# Patient Record
Sex: Male | Born: 1990 | Race: White | Hispanic: No | Marital: Single | Smoking: Current some day smoker
Health system: Southern US, Community
[De-identification: ages and names within clinical notes are randomized; demographics above are authoritative.]

## PROBLEM LIST (undated history)

## (undated) DIAGNOSIS — F419 Anxiety disorder, unspecified: Secondary | ICD-10-CM

## (undated) DIAGNOSIS — F410 Panic disorder [episodic paroxysmal anxiety] without agoraphobia: Secondary | ICD-10-CM

## (undated) DIAGNOSIS — E785 Hyperlipidemia, unspecified: Secondary | ICD-10-CM

## (undated) DIAGNOSIS — F1010 Alcohol abuse, uncomplicated: Secondary | ICD-10-CM

## (undated) DIAGNOSIS — F1910 Other psychoactive substance abuse, uncomplicated: Secondary | ICD-10-CM

## (undated) DIAGNOSIS — F329 Major depressive disorder, single episode, unspecified: Secondary | ICD-10-CM

## (undated) DIAGNOSIS — F32A Depression, unspecified: Secondary | ICD-10-CM

---

## 2014-04-26 ENCOUNTER — Encounter (HOSPITAL_COMMUNITY): Payer: Self-pay | Admitting: Emergency Medicine

## 2014-04-26 ENCOUNTER — Emergency Department (HOSPITAL_COMMUNITY)
Admission: EM | Admit: 2014-04-26 | Discharge: 2014-04-27 | Disposition: A | Discharge status: Home / Self Care | Payer: Federal, State, Local not specified - PPO | Attending: Emergency Medicine | Admitting: Emergency Medicine

## 2014-04-26 DIAGNOSIS — Z720 Tobacco use: Secondary | ICD-10-CM | POA: Diagnosis not present

## 2014-04-26 DIAGNOSIS — IMO0002 Reserved for concepts with insufficient information to code with codable children: Secondary | ICD-10-CM

## 2014-04-26 DIAGNOSIS — R000 Tachycardia, unspecified: Secondary | ICD-10-CM | POA: Diagnosis not present

## 2014-04-26 DIAGNOSIS — T408X1A Poisoning by lysergide [LSD], accidental (unintentional), initial encounter: Secondary | ICD-10-CM | POA: Insufficient documentation

## 2014-04-26 DIAGNOSIS — Y929 Unspecified place or not applicable: Secondary | ICD-10-CM | POA: Diagnosis not present

## 2014-04-26 DIAGNOSIS — F410 Panic disorder [episodic paroxysmal anxiety] without agoraphobia: Secondary | ICD-10-CM | POA: Insufficient documentation

## 2014-04-26 DIAGNOSIS — E669 Obesity, unspecified: Secondary | ICD-10-CM | POA: Diagnosis not present

## 2014-04-26 DIAGNOSIS — Y998 Other external cause status: Secondary | ICD-10-CM | POA: Diagnosis not present

## 2014-04-26 DIAGNOSIS — Y939 Activity, unspecified: Secondary | ICD-10-CM | POA: Diagnosis not present

## 2014-04-26 HISTORY — DX: Panic disorder (episodic paroxysmal anxiety): F41.0

## 2014-04-26 MED ORDER — ALPRAZOLAM 0.5 MG PO TABS
1.0000 mg | ORAL_TABLET | Freq: Once | ORAL | Status: AC
  Administered 2014-04-27: 1 mg via ORAL
  Filled 2014-04-26: qty 2

## 2014-04-26 NOTE — ED Provider Notes (Signed)
CSN: [id number]     Arrival date & time 04/26/14  2251 History   First MD Initiated Contact with Patient 04/26/14 2335     Chief Complaint  Patient presents with  . Panic Attack     (Consider location/radiation/quality/duration/timing/severity/associated sxs/prior Treatment) The history is provided by the patient.   Camdin Hegner is a 24 y.o. male who presents to the ED complaining of having a panic attack that started approximately 1 hour prior to arrival to the ED. He states that the feeling started after doing what he thought was LSD but is not really sure what it was. He does have a history of panic attacks and has been on Xanax in the past. He moved here one year ago and has not had any medication or panic attacks until now.    Past Medical History  Diagnosis Date  . Panic attacks    History reviewed. No pertinent past surgical history. No family history on file. History  Substance Use Topics  . Smoking status: Current Every Day Smoker  . Smokeless tobacco: Not on file  . Alcohol Use: Yes    Review of Systems  Respiratory: Positive for chest tightness.   Cardiovascular: Negative for chest pain.  Psychiatric/Behavioral: The patient is nervous/anxious.   all other systems negative    Allergies  Review of patient's allergies indicates no known allergies.  Home Medications   Prior to Admission medications   Medication Sig Start Date End Date Taking? Authorizing Provider  ALPRAZolam Prudy Feeler) 0.5 MG tablet Take 1 tablet (0.5 mg total) by mouth 2 (two) times daily as needed for anxiety. 04/27/14   Keylan Costabile Orlene Och, NP   BP 139/79 mmHg  Pulse 97  Temp(Src) 98.1 F (36.7 C) (Oral)  Resp 20  Ht 6' (1.829 m)  Wt 280 lb (127.007 kg)  BMI 37.97 kg/m2  SpO2 100% Physical Exam  Constitutional: He is oriented to person, place, and time. No distress.  obese  HENT:  Head: Normocephalic.  Eyes: Conjunctivae and EOM are normal.  Neck: Normal range of motion. Neck supple.   Cardiovascular: Tachycardia present.   Pulmonary/Chest: Effort normal. He has no wheezes. He has no rales.  Hyperventilating   Abdominal: Soft. There is no tenderness.  Musculoskeletal: Normal range of motion.  Neurological: He is alert and oriented to person, place, and time. No cranial nerve deficit.  Skin: Skin is warm and dry.  Psychiatric: His speech is normal. Thought content normal. His mood appears anxious.  Nursing note and vitals reviewed.   ED Course  Procedures  Patient given xanax 1 mg. PO @ 0230 patient feeling much better vital signs normal. Patient no longer feeling anxious.   Results for orders placed or performed during the hospital encounter of 04/26/14 (from the past 24 hour(s))  Drug screen panel, emergency     Status: None   Collection Time: 04/27/14 12:13 AM  Result Value Ref Range   Opiates NONE DETECTED NONE DETECTED   Cocaine NONE DETECTED NONE DETECTED   Benzodiazepines NONE DETECTED NONE DETECTED   Amphetamines NONE DETECTED NONE DETECTED   Tetrahydrocannabinol NONE DETECTED NONE DETECTED   Barbiturates NONE DETECTED NONE DETECTED    BP 139/79 mmHg  Pulse 97  Temp(Src) 98.1 F (36.7 C) (Oral)  Resp 20  Ht 6' (1.829 m)  Wt 280 lb (127.007 kg)  BMI 37.97 kg/m2  SpO2 100%  MDM  24 y.o. male with panic attack after taking what he thought was LSD. Stable for d/c with  normal vital signs and no longer symptomatic. Discussed with the patient plan of care and all questioned fully answered. He will keep his appointment with his new PCP and return here if any problems arise.   Final diagnoses:  Anxiety attack  LSD reaction       Janne NapoleonHope M Raahil Ong, NP 04/27/14 0245  Ward GivensIva L Knapp, MD 04/27/14 [phone]

## 2014-04-26 NOTE — ED Notes (Signed)
Patient states he did LSD tonight and he thinks this is what precipitated his panic attack.

## 2014-04-26 NOTE — ED Notes (Signed)
Patient states he is having a panic attack x 1 hour.

## 2014-04-27 LAB — RAPID URINE DRUG SCREEN, HOSP PERFORMED
Amphetamines: NOT DETECTED
BARBITURATES: NOT DETECTED
Benzodiazepines: NOT DETECTED
Cocaine: NOT DETECTED
Opiates: NOT DETECTED
TETRAHYDROCANNABINOL: NOT DETECTED

## 2014-04-27 MED ORDER — ALPRAZOLAM 0.5 MG PO TABS: 0.5000 mg | ORAL_TABLET | Freq: Two times a day (BID) | ORAL | Status: DC | PRN

## 2014-04-27 MED ORDER — ALPRAZOLAM 0.5 MG PO TABS
ORAL_TABLET | ORAL | Status: AC
  Filled 2014-04-27: qty 1

## 2014-04-27 NOTE — Discharge Instructions (Signed)

## 2014-05-02 ENCOUNTER — Emergency Department (HOSPITAL_COMMUNITY)
Admission: EM | Admit: 2014-05-02 | Discharge: 2014-05-02 | Disposition: A | Discharge status: Home / Self Care | Payer: Federal, State, Local not specified - PPO | Attending: Emergency Medicine | Admitting: Emergency Medicine

## 2014-05-02 ENCOUNTER — Encounter (HOSPITAL_COMMUNITY): Payer: Self-pay | Admitting: Emergency Medicine

## 2014-05-02 DIAGNOSIS — F410 Panic disorder [episodic paroxysmal anxiety] without agoraphobia: Secondary | ICD-10-CM | POA: Diagnosis not present

## 2014-05-02 DIAGNOSIS — L03311 Cellulitis of abdominal wall: Secondary | ICD-10-CM | POA: Insufficient documentation

## 2014-05-02 DIAGNOSIS — M1990 Unspecified osteoarthritis, unspecified site: Secondary | ICD-10-CM | POA: Insufficient documentation

## 2014-05-02 DIAGNOSIS — Z720 Tobacco use: Secondary | ICD-10-CM | POA: Insufficient documentation

## 2014-05-02 DIAGNOSIS — L02211 Cutaneous abscess of abdominal wall: Secondary | ICD-10-CM | POA: Insufficient documentation

## 2014-05-02 DIAGNOSIS — L0390 Cellulitis, unspecified: Secondary | ICD-10-CM

## 2014-05-02 DIAGNOSIS — Z79899 Other long term (current) drug therapy: Secondary | ICD-10-CM | POA: Insufficient documentation

## 2014-05-02 DIAGNOSIS — L0291 Cutaneous abscess, unspecified: Secondary | ICD-10-CM

## 2014-05-02 DIAGNOSIS — M791 Myalgia: Secondary | ICD-10-CM | POA: Insufficient documentation

## 2014-05-02 LAB — CBG MONITORING, ED: GLUCOSE-CAPILLARY: 74 mg/dL (ref 70–99)

## 2014-05-02 MED ORDER — HYDROCODONE-ACETAMINOPHEN 5-325 MG PO TABS: 1.0000 | ORAL_TABLET | ORAL | Status: DC | PRN

## 2014-05-02 MED ORDER — DOXYCYCLINE HYCLATE 100 MG PO TABS
100.0000 mg | ORAL_TABLET | Freq: Once | ORAL | Status: AC
  Administered 2014-05-02: 100 mg via ORAL
  Filled 2014-05-02: qty 1

## 2014-05-02 MED ORDER — DOXYCYCLINE HYCLATE 100 MG PO TABS: 100.0000 mg | ORAL_TABLET | Freq: Two times a day (BID) | ORAL | Status: DC

## 2014-05-02 MED ORDER — HYDROCODONE-ACETAMINOPHEN 5-325 MG PO TABS
1.0000 | ORAL_TABLET | Freq: Once | ORAL | Status: AC
  Administered 2014-05-02: 1 via ORAL
  Filled 2014-05-02: qty 1

## 2014-05-02 NOTE — ED Provider Notes (Signed)
CSN: [id number]     Arrival date & time 05/02/14  1831 History   First MD Initiated Contact with Patient 05/02/14 2004     Chief Complaint  Patient presents with  . Abscess     (Consider location/radiation/quality/duration/timing/severity/associated sxs/prior Treatment) The history is provided by the patient and a parent.   Jeffery Hancock is a 24 y.o. male presenting for evaluation of an abscess on his right abdomen.  He first noticed a large pimple about 5 days ago which has become more tender and popped today at work draining a large amount of pus and blood.  It is tender, but improved since it drained.  He denies fevers, chills, nausea or myalgias. He is not diabetic but has strong family history of DM.     Past Medical History  Diagnosis Date  . Panic attacks    History reviewed. No pertinent past surgical history. Family History  Problem Relation Age of Onset  . Diabetes Other   . Hypertension Other   . Cancer Other   . Heart failure Other    History  Substance Use Topics  . Smoking status: Current Every Day Smoker -- 0.50 packs/day    Types: Cigarettes  . Smokeless tobacco: Not on file  . Alcohol Use: 18.0 oz/week    30 Cans of beer per week    Review of Systems  Constitutional: Negative for fever and chills.  Respiratory: Negative for shortness of breath and wheezing.   Gastrointestinal: Negative for nausea.  Musculoskeletal: Positive for myalgias and arthralgias.  Skin: Positive for color change.  Neurological: Negative for weakness and numbness.      Allergies  Review of patient's allergies indicates no known allergies.  Home Medications   Prior to Admission medications   Medication Sig Start Date End Date Taking? Authorizing Provider  ALPRAZolam Prudy Feeler) 0.5 MG tablet Take 1 tablet (0.5 mg total) by mouth 2 (two) times daily as needed for anxiety. 04/27/14   Hope Orlene Och, NP  doxycycline (VIBRA-TABS) 100 MG tablet Take 1 tablet (100 mg total) by mouth  2 (two) times daily. 05/02/14   Burgess Amor, PA-C  HYDROcodone-acetaminophen (NORCO/VICODIN) 5-325 MG per tablet Take 1 tablet by mouth every 4 (four) hours as needed for moderate pain. 05/02/14   Burgess Amor, PA-C   BP 147/89 mmHg  Pulse 100  Temp(Src) 98.9 F (37.2 C) (Oral)  Resp 20  Ht 6' (1.829 m)  Wt 280 lb (127.007 kg)  BMI 37.97 kg/m2  SpO2 100% Physical Exam  Constitutional: He appears well-developed and well-nourished.  HENT:  Head: Normocephalic and atraumatic.  Eyes: Conjunctivae are normal.  Neck: Normal range of motion.  Cardiovascular: Normal rate, regular rhythm, normal heart sounds and intact distal pulses.   Pulmonary/Chest: Effort normal and breath sounds normal. He has no wheezes.  Abdominal: Soft. Bowel sounds are normal. There is no tenderness.  Musculoskeletal: Normal range of motion.  Neurological: He is alert.  Skin: Skin is dry.  Draining abscess right mid abdomen, approx 2 cm area of induration with scant purulent drainage with palpation.  7 cm x 14 cm surrounding erythema, no red streaking.  Psychiatric: He has a normal mood and affect.  Nursing note and vitals reviewed.   ED Course  Procedures (including critical care time)   Abscess site flushed with no further purulent drainage obtained   Labs Review Labs Reviewed  CBG MONITORING, ED    Imaging Review No results found.   EKG Interpretation None  MDM   Final diagnoses:  Abscess and cellulitis    Cellulitis edges marked. Pt advised close f/u here if cellulitis continues to enlarge.  Doxycycline, first dose given here.  Hydrocodone, warm compresses.   The patient appears reasonably screened and/or stabilized for discharge and I doubt any other medical condition or other [facility] requiring further screening, evaluation, or treatment in the ED at this time prior to discharge.    Burgess AmorJulie Sheriece Jefcoat, PA-C 05/03/14 1139  Vanetta MuldersScott Zackowski, MD 05/04/14 [phone]

## 2014-05-02 NOTE — ED Notes (Signed)
Pt reports abscess to R side of abdomen.

## 2014-05-02 NOTE — ED Notes (Signed)
Pt verbalized understanding of no driving and to use caution within 4 hours of taking pain meds due to meds cause drowsiness 

## 2014-05-02 NOTE — Discharge Instructions (Signed)
Cellulitis Cellulitis is an infection of the skin and the tissue beneath it. The infected area is usually red and tender. Cellulitis occurs most often in the arms and lower legs.  CAUSES  Cellulitis is caused by bacteria that enter the skin through cracks or cuts in the skin. The most common types of bacteria that cause cellulitis are staphylococci and streptococci. SIGNS AND SYMPTOMS   Redness and warmth.  Swelling.  Tenderness or pain.  Fever. DIAGNOSIS  Your health care provider can usually determine what is wrong based on a physical exam. Blood tests may also be done. TREATMENT  Treatment usually involves taking an antibiotic medicine. HOME CARE INSTRUCTIONS   Take your antibiotic medicine as directed by your health care provider. Finish the antibiotic even if you start to feel better.  Keep the infected arm or leg elevated to reduce swelling.  Apply a warm cloth to the affected area up to 4 times per day to relieve pain.  Take medicines only as directed by your health care provider.  Keep all follow-up visits as directed by your health care provider. SEEK MEDICAL CARE IF:   You notice red streaks coming from the infected area.  Your red area gets larger or turns dark in color.  Your bone or joint underneath the infected area becomes painful after the skin has healed.  Your infection returns in the same area or another area.  You notice a swollen bump in the infected area.  You develop new symptoms.  You have a fever. SEEK IMMEDIATE MEDICAL CARE IF:   You feel very sleepy.  You develop vomiting or diarrhea.  You have a general ill feeling (malaise) with muscle aches and pains. MAKE SURE YOU:   Understand these instructions.  Will watch your condition.  Will get help right away if you are not doing well or get worse. Document Released: 11/29/2004 Document Revised: 07/06/2013 Document Reviewed: 05/07/2011 [employer] Patient Information 2015 [place].  This information is not intended to replace advice given to you by your health care provider. Make sure you discuss any questions you have with your health care provider.  Abscess Care After An abscess (also called a boil or furuncle) is an infected area that contains a collection of pus. Signs and symptoms of an abscess include pain, tenderness, redness, or hardness, or you may feel a moveable soft area under your skin. An abscess can occur anywhere in the body. The infection may spread to surrounding tissues causing cellulitis. A cut (incision) by the surgeon was made over your abscess and the pus was drained out. Gauze may have been packed into the space to provide a drain that will allow the cavity to heal from the inside outwards. The boil may be painful for 5 to 7 days. Most people with a boil do not have high fevers. Your abscess, if seen early, may not have localized, and may not have been lanced. If not, another appointment may be required for this if it does not get better on its own or with medications.  Your abscess is draining spontaneously so did not need to be lanced today. HOME CARE INSTRUCTIONS   Only take over-the-counter or prescription medicines for pain, discomfort, or fever as directed by your caregiver.  When you bathe, soak and then remove gauze or iodoform packs at least daily or as directed by your caregiver. You may then wash the wound gently with mild soapy water. Repack with gauze or do as your caregiver directs. SEEK  IMMEDIATE MEDICAL CARE IF:   You develop increased pain, swelling, redness, drainage, or bleeding in the wound site.  You develop signs of generalized infection including muscle aches, chills, fever, or a general ill feeling.  An oral temperature above 102 F (38.9 C) develops, not controlled by medication. See your caregiver for a recheck if you develop any of the symptoms described above. If medications (antibiotics) were prescribed, take them as  directed. Document Released: 09/07/2004 Document Revised: 05/14/2011 Document Reviewed: 05/05/2007 [facility] Patient Information 2015 [place], [place]. This information is not intended to replace advice given to you by your health care provider. Make sure you discuss any questions you have with your health care provider.

## 2014-06-20 ENCOUNTER — Emergency Department (HOSPITAL_COMMUNITY)
Admission: EM | Admit: 2014-06-20 | Discharge: 2014-06-20 | Disposition: A | Discharge status: Home / Self Care | Payer: Federal, State, Local not specified - PPO | Attending: Emergency Medicine | Admitting: Emergency Medicine

## 2014-06-20 ENCOUNTER — Encounter (HOSPITAL_COMMUNITY): Payer: Self-pay | Admitting: Emergency Medicine

## 2014-06-20 DIAGNOSIS — F1910 Other psychoactive substance abuse, uncomplicated: Secondary | ICD-10-CM | POA: Diagnosis not present

## 2014-06-20 DIAGNOSIS — Z792 Long term (current) use of antibiotics: Secondary | ICD-10-CM | POA: Insufficient documentation

## 2014-06-20 DIAGNOSIS — Z720 Tobacco use: Secondary | ICD-10-CM | POA: Diagnosis not present

## 2014-06-20 DIAGNOSIS — Z8639 Personal history of other endocrine, nutritional and metabolic disease: Secondary | ICD-10-CM | POA: Diagnosis not present

## 2014-06-20 DIAGNOSIS — F1010 Alcohol abuse, uncomplicated: Secondary | ICD-10-CM | POA: Insufficient documentation

## 2014-06-20 DIAGNOSIS — F410 Panic disorder [episodic paroxysmal anxiety] without agoraphobia: Secondary | ICD-10-CM | POA: Diagnosis not present

## 2014-06-20 DIAGNOSIS — Z79899 Other long term (current) drug therapy: Secondary | ICD-10-CM | POA: Diagnosis not present

## 2014-06-20 HISTORY — DX: Anxiety disorder, unspecified: F41.9

## 2014-06-20 HISTORY — DX: Other psychoactive substance abuse, uncomplicated: F19.10

## 2014-06-20 HISTORY — DX: Alcohol abuse, uncomplicated: F10.10

## 2014-06-20 HISTORY — DX: Hyperlipidemia, unspecified: E78.5

## 2014-06-20 NOTE — ED Notes (Signed)
Provided with list of resources for treatment centers.

## 2014-06-20 NOTE — Discharge Instructions (Signed)
°Emergency Department Resource Guide °1) Find a Doctor and Pay Out of Pocket °Although you won't have to find out who is covered by your insurance plan, it is a good idea to ask around and get recommendations. You will then need to call the office and see if the doctor you have chosen will accept you as a new patient and what types of options they offer for patients who are self-pay. Some doctors offer discounts or will set up payment plans for their patients who do not have insurance, but you will need to ask so you aren't surprised when you get to your appointment. ° °2) Contact Your Local Health Department °Not all health departments have doctors that can see patients for sick visits, but many do, so it is worth a call to see if yours does. If you don't know where your local health department is, you can check in your phone book. The CDC also has a tool to help you locate your state's health department, and many state websites also have listings of all of their local health departments. ° °3) Find a Walk-in Clinic °If your illness is not likely to be very severe or complicated, you may want to try a walk in clinic. These are popping up all over the country in pharmacies, drugstores, and shopping centers. They're usually staffed by nurse practitioners or physician assistants that have been trained to treat common illnesses and complaints. They're usually fairly quick and inexpensive. However, if you have serious medical issues or chronic medical problems, these are probably not your best option. ° °No Primary Care Doctor: °- Call [facility] at  832-8000 - they can help you locate a primary care doctor that  accepts your insurance, provides certain services, etc. °- Physician Referral Service- 1-800-533-3463 ° °Chronic Pain Problems: °Organization         Address  Phone   Notes  °[facility]  (336) 297-2271 Patients need to be referred by their primary care doctor.  ° °Medication  Assistance: °Organization         Address  Phone   Notes  °[place] [facility] [address] °[place], [place] [address] (336) 641-8030 --Must be a resident of [place] °-- Must have NO insurance coverage whatsoever (no Medicaid/ Medicare, etc.) °-- The pt. MUST have a primary care doctor that directs their care regularly and follows them in the community °  °[facility]  (866) 331-1348   °[employer]  (888) 892-1162   ° °Agencies that provide inexpensive medical care: °Organization         Address  Phone   Notes  °[facility]  (336) 832-8035   °[facility]    (336) 832-7272   °[facility] [address] °[place], [place] [address] (336) 832-4777   °[facility] [address], °[place] (336) 271-4999   °[facility]    (336) 373-0678   °[facility]    (336) 272-1050   °[facility] ° [address], [place] Phone:  (336) 832-4444, Fax:  (336) 832-4440 Hours of Operation:  9 am - 6 pm, M-F.  Also accepts Medicaid/Medicare and self-pay.  °[facility] ° [address], [place] Phone: (336) 832-3150, Fax: (336) [phone]. Hours of Operation:  8:30 am - 5:30 pm, M-F.  Also accepts Medicaid and self-pay.  °[facility] [place] [address], [place] Phone: (336) 878-6027   °[facility] [address], [place], [place] (336)723-1848, Ext. 123 Mondays & Thursdays: 7-9 AM.  First 15 patients are seen on a first come, first serve basis. °  ° °Medicaid-accepting [place] Providers: ° °Organization         Address  Phone   Notes  °[facility] [address], [place] (336) 641-2100 Also accepts self-pay patients.  °[facility] [address], [place] ° (336) 856-9996   °[facility] [address], [place]  (336) 288-8857   °[facility] [address] [place] [address], [place] (336) 299-7000   °Veita Bland [address], [place]  ° (336) 373-1557 Only accepts [place] Access Medicaid patients after they have their name applied to their card.  ° °Self-Pay (no insurance) in [place]: ° °Organization         Address  Phone   Notes  °Sickle Cell Patients, [facility] [address], [place] (336) 832-1970   °[facility] [address], [place] (336) 832-4400   °[facility] ° [address], [place] (336) 992-4800   °[facility]/Dr. Osei-Bonsu ° [address] [place] [address], [place] or [address], [place] (336) 841-8500 Phone number for both [place] and [place] locations is the same.  °[facility] [address], [place] (336) 299-0000   °[facility] [address] [place] [address], [place] or [address] (336) 852-7530 °(336) 878-2260   °[facility] [address], [place] (336) 350-1642, phone; (336) 294-5005, fax Sees patients 1st and 3rd Saturday of every month.  Must not qualify for public or private insurance (i.e. Medicaid, Medicare, [place] Health Choice, Veterans' Benefits) • Household income should be no more than 200% of the poverty level •The clinic cannot treat you if you are pregnant or think you are pregnant • Sexually transmitted diseases are not treated at the clinic.  ° ° °Dental Care: °Organization         Address  Phone  Notes  °[place] [facility] [address], [place] (336) 641-6152 Accepts children up to age 21 who are enrolled in Medicaid or [place] Health Choice; pregnant women with a Medicaid card; and children who have applied for Medicaid or [place] Health Choice, but were declined, whose parents can pay a reduced fee at time of service.  °[place]  [facility] [place]  [address], [place] (336) 641-7733 Accepts children up to age 21 who are enrolled in Medicaid or [place] Health Choice; pregnant women with a Medicaid card; and children who have applied for Medicaid or [place] Health Choice, but were declined, whose parents can pay a reduced fee at time of service.  °[facility] ° [address], [place] (336) 641-4533 Patients are seen by appointment only. Walk-ins are not accepted. [facility] will see patients 18 years of age and older. °Monday - Tuesday (8am-5pm) °Most Wednesdays (8:30-5pm) °$30 per visit, cash only  °[facility] ° [address], [place] (336) 641-4533 Patients are seen by appointment only. Walk-ins are not accepted. [facility] will see patients 18 years of age and older. °One   Wednesday Evening (Monthly: Volunteer Based).  $30 per visit, cash only  °[facility]  (919) 537-3737 for adults; Children under age 4, call Graduate Pediatric Dentistry at ([phone]) [phone]. Children aged 4-14, please call ([phone]) [phone] to request a pediatric application. ° Dental services are provided in all areas of dental care including fillings, crowns and bridges, complete and partial dentures, implants, gum treatment, root canals, and extractions. Preventive care is also provided. Treatment is provided to both adults and children. °Patients are selected via a lottery and there is often a waiting list. °  °[facility] [address], °[place] ° (336) 763-8833 [email or web address] °  °[facility] [address], [place], [place] (336)723-1848, Ext. 123 Second and Fourth Thursday of each month, opens at 6:30 AM; Clinic ends at 9 AM.  Patients are seen on a first-come first-served basis, and a limited number are seen during each clinic.  ° °[facility] ° [address], [place], [place] (336) 723-7904    Eligibility Requirements °You must have lived in [place], [place], or [place] counties for at least the last three months. °  You cannot be eligible for state or federal sponsored healthcare insurance, including Veterans Administration, Medicaid, or Medicare. °  You generally cannot be eligible for healthcare insurance through your employer.  °  How to apply: °Eligibility screenings are held every Tuesday and Wednesday afternoon from 1:00 pm until 4:00 pm. You do not need an appointment for the interview!  °[facility] [address], [place], [place] 336-631-2330   °[facility]  336-342-8273   °[facility]  336-703-3100   °[facility]  336-570-6415   ° °Behavioral Health Resources in the Community: °Intensive Outpatient Programs °Organization         Address  Phone  Notes  °[facility] [address], [place], [place] 336-878-6098   °[facility] [address], [place], [name] 336-832-9800   °[facility] [address], [place], [place] ° 336-882-2125   °[facility] [address],  °[place], [place] 1-800-853-5163 or 336-641-4981   °Substance Abuse Resources °Organization         Address  Phone  Notes  °[facility]  336-882-2125   °[facility]  336-784-9470   °[facility]  336-285-9073   °[facility]  336-845-3988   °Residential & Outpatient Substance Abuse Program  1-800-659-3381   °Psychological Services °Organization         Address  Phone  Notes  °[facility]  336- 832-9600   °[facility]  336- 378-7881   °[facility] [address], [place] 1-800-853-5163 or 336-641-4981   ° °Mobile Crisis Teams °Organization         Address  Phone  Notes  °[facility], Mobile Crisis Care Unit  1-877-626-1772   °Assertive °[facility] ° [address]  [place], [place] 336-834-9664   °Sharon DeEsch [address] °[place] 336-554-5454   ° °Self-Help/Support Groups °Organization         Address  Phone             Notes  °[facility]  - variety of support groups  336- 373-1402 Call for more information  °Narcotics Anonymous (NA), [facility] [address], °[place]  2 meetings at this location  ° °  Residential Treatment Programs Organization         Address  Phone  Notes  [facility] [address],    [place]  [phone])[phone]   [facility]  [address], [place] [phone], [place], [place] [phone]   [facility] [address], [place] [phone] Admissions: 8am-3pm M-F  [facility] [address]    [place], [place] [phone]   [facility] [address], [place], [place] [phone]   [facility] [address]  [place], [place] [phone]   [facility] Intensive Outpatient [address], [place], [place] [phone]) [phone]   [facility] [address]  [place], [place] [phone] or [phone]   [facility] ([facility]) [address] [place], [place] [phone] Accepts Medicaid  [facility] [address]  [place] [phone] Substance Abuse/Addiction Treatment   [facility]         Address  Phone  Notes  [facility]  640-746-4162(888) [phone]   Angie FavaJulie Brannon, PhD [address], [address] [place], [place]   [phone](336) [phone] or (780)[phone](336) [phone]   [facility]   [address] [place], [place] [phone](336) [phone]   [facility] [address], [place], [place] [phone](336) [phone] Insurance/Medicaid/sponsorship through [facility] [address]                                    [place], [place] [phone](336) [phone] Therapy/tele-psych/case    [facility] [address]   [place], [place] [phone](336) [phone]    Dr. Lolly MustacheArfeen  [phone]) [phone]   [facility] [place]  [employer] [facility] 1) [address], [place] 2) [address], [place] 3)  [address], [place] [phone](336) [phone](336) [phone]  [phone]) [phone]   [facility] Child Abuse Hotline 813-617-0296(336) [phone] or [phone](336) [phone] (After Hours)      Call the substance abuse resources given to you today to assist you with your detox.  Return to the Emergency Department immediately sooner if worsening.

## 2014-06-20 NOTE — ED Provider Notes (Signed)
CSN: [id number]     Arrival date & time 06/20/14  0849 History   First MD Initiated Contact with Patient 06/20/14 [phone]     Chief Complaint  Patient presents with  . Alcohol Problem  . Addiction Problem      HPI Pt was seen at 0925. Per pt and his family, c/o gradual onset and persistence of constant polysubstance abuse for the past 1 to 2 years. Substances include: etoh, crack, oxycodone, heroin. Pt requesting detox. Pt has not attended a detox program previously. Denies withdrawal symptoms at this time. Denies SI/SA, no HI, no hallucinations.    Past Medical History  Diagnosis Date  . Panic attacks   . Anxiety   . Hyperlipemia   . Alcohol abuse   . Polysubstance abuse     crack, oxycodone, heroin, etoh   History reviewed. No pertinent past surgical history.   Family History  Problem Relation Age of Onset  . Diabetes Other   . Hypertension Other   . Cancer Other   . Heart failure Other    History  Substance Use Topics  . Smoking status: Current Every Day Smoker -- 0.50 packs/day    Types: Cigarettes  . Smokeless tobacco: Not on file  . Alcohol Use: 18.0 oz/week    30 Cans of beer per week    Review of Systems ROS: Statement: All systems negative except as marked or noted in the HPI; Constitutional: Negative for fever and chills. ; ; Eyes: Negative for eye pain, redness and discharge. ; ; ENMT: Negative for ear pain, hoarseness, nasal congestion, sinus pressure and sore throat. ; ; Cardiovascular: Negative for chest pain, palpitations, diaphoresis, dyspnea and peripheral edema. ; ; Respiratory: Negative for cough, wheezing and stridor. ; ; Gastrointestinal: Negative for nausea, vomiting, diarrhea, abdominal pain, blood in stool, hematemesis, jaundice and rectal bleeding. . ; ; Genitourinary: Negative for dysuria, flank pain and hematuria. ; ; Musculoskeletal: Negative for back pain and neck pain. Negative for swelling and trauma.; ; Skin: Negative for pruritus, rash,  abrasions, blisters, bruising and skin lesion.; ; Neuro: Negative for headache, lightheadedness and neck stiffness. Negative for weakness, altered level of consciousness , altered mental status, extremity weakness, paresthesias, involuntary movement, seizure and syncope. ; Psych:  No SI, no SA, no HI, no hallucinations.   Allergies  Review of patient's allergies indicates no known allergies.  Home Medications   Prior to Admission medications   Medication Sig Start Date End Date Taking? Authorizing Provider  DULoxetine (CYMBALTA) 30 MG capsule Take 30 mg by mouth daily.   Yes Historical Provider, MD  ALPRAZolam Prudy Feeler(XANAX) 0.5 MG tablet Take 1 tablet (0.5 mg total) by mouth 2 (two) times daily as needed for anxiety. 04/27/14   Hope Orlene OchM Neese, NP  doxycycline (VIBRA-TABS) 100 MG tablet Take 1 tablet (100 mg total) by mouth 2 (two) times daily. 05/02/14   Burgess AmorJulie Idol, PA-C  HYDROcodone-acetaminophen (NORCO/VICODIN) 5-325 MG per tablet Take 1 tablet by mouth every 4 (four) hours as needed for moderate pain. 05/02/14   Burgess AmorJulie Idol, PA-C   BP 148/97 mmHg  Pulse 114  Temp(Src) 99.1 F (37.3 C) (Oral)  Resp 18  Ht 6' (1.829 m)  Wt 290 lb (131.543 kg)  BMI 39.32 kg/m2  SpO2 100% Physical Exam  0930: Physical examination:  Nursing notes reviewed; Vital signs and O2 SAT reviewed;  Constitutional: Well developed, Well nourished, Well hydrated, In no acute distress; Head:  Normocephalic, atraumatic; Eyes: EOMI, PERRL, No scleral icterus; ENMT:  Mouth and pharynx normal, Mucous membranes moist; Neck: Supple, Full range of motion, No lymphadenopathy; Cardiovascular: Regular rate and rhythm, No murmur, rub, or gallop; Respiratory: Breath sounds clear & equal bilaterally, No rales, rhonchi, wheezes.  Speaking full sentences with ease, Normal respiratory effort/excursion; Chest: Nontender, Movement normal; Abdomen: Soft, Nontender, Nondistended, Normal bowel sounds;; Extremities: Pulses normal, No tenderness, No edema,  No calf edema or asymmetry.; Neuro: AA&Ox3, Major CN grossly intact.  Speech clear. No gross focal motor or sensory deficits in extremities.; Skin: Color normal, Warm, Dry.; Psych:  Denies SI, no psychosis.    ED Course  Procedures     EKG Interpretation None      MDM  MDM Reviewed: previous chart, nursing note and vitals      0935:  No signs of withdrawal. No SI. Per policy, pt d/c with outpt resources. Pt and family agreeable with plan.   Samuel Jester, DO 06/21/14 2137

## 2014-06-20 NOTE — ED Notes (Signed)
Pt states he is here due to his alcohol abuse and crack addiction, pt states he will sometimes abuse oxycodone as well. Pt states he last drank alcohol about 30 min ago, pt does not appear intoxicated at this time. Pt states he drinks about 12 pk of beer a day. Pt states he has been drinking since he was 14 and heavily drinking since he was 18 Pt states last time he used oxycodone was yesterday 45mg  around 5pm yesterday pt states he snorts them, which is about avg when he uses. Pt last smoked crack last night pt states he has smoked it everyday for the past 3-4 weeks.Pt states he started using crack around July of last year. Pt states he use to use heroine but hasn't used since September 2015. Pt denies any IV drug use.

## 2014-06-20 NOTE — ED Notes (Signed)
[facility] faxed ED summary, history, vital signs per request from [facility] with patient permission.

## 2014-06-24 ENCOUNTER — Emergency Department (HOSPITAL_COMMUNITY)
Admission: EM | Admit: 2014-06-24 | Discharge: 2014-06-24 | Disposition: A | Discharge status: Home / Self Care | Payer: Federal, State, Local not specified - PPO | Attending: Emergency Medicine | Admitting: Emergency Medicine

## 2014-06-24 ENCOUNTER — Encounter (HOSPITAL_COMMUNITY): Payer: Self-pay | Admitting: Cardiology

## 2014-06-24 DIAGNOSIS — Z79899 Other long term (current) drug therapy: Secondary | ICD-10-CM | POA: Diagnosis not present

## 2014-06-24 DIAGNOSIS — Z8639 Personal history of other endocrine, nutritional and metabolic disease: Secondary | ICD-10-CM | POA: Insufficient documentation

## 2014-06-24 DIAGNOSIS — Z720 Tobacco use: Secondary | ICD-10-CM | POA: Diagnosis not present

## 2014-06-24 DIAGNOSIS — R251 Tremor, unspecified: Secondary | ICD-10-CM | POA: Diagnosis present

## 2014-06-24 DIAGNOSIS — F419 Anxiety disorder, unspecified: Secondary | ICD-10-CM | POA: Diagnosis not present

## 2014-06-24 MED ORDER — LORAZEPAM 2 MG/ML IJ SOLN
2.0000 mg | Freq: Once | INTRAMUSCULAR | Status: AC
Start: 2014-06-24 — End: 2014-06-24
  Administered 2014-06-24: 2 mg via INTRAMUSCULAR
  Filled 2014-06-24: qty 1

## 2014-06-24 MED ORDER — HYDROXYZINE HCL 25 MG PO TABS: ORAL_TABLET | ORAL | Status: DC

## 2014-06-24 NOTE — Discharge Instructions (Signed)
Follow-up as planned 

## 2014-06-24 NOTE — ED Notes (Addendum)
Just discharged from center for detox.  detoxing from alcohol.  Is here to day for shaking.  And feeling like his heart is beating out of his chest.

## 2014-06-24 NOTE — ED Provider Notes (Signed)
CSN: [id number]     Arrival date & time 06/24/14  1516 History   First MD Initiated Contact with Patient 06/24/14 1710     Chief Complaint  Patient presents with  . Shaking  . Withdrawal     (Consider location/radiation/quality/duration/timing/severity/associated sxs/prior Treatment) Patient is a 24 y.o. male presenting with anxiety. The history is provided by the patient (pt complains of being anxious since off alcohol for a few days).  Anxiety This is a recurrent problem. The current episode started 12 to 24 hours ago. The problem occurs constantly. The problem has not changed since onset.Pertinent negatives include no chest pain, no abdominal pain and no headaches. Nothing aggravates the symptoms. Nothing relieves the symptoms.    Past Medical History  Diagnosis Date  . Panic attacks   . Anxiety   . Hyperlipemia   . Alcohol abuse   . Polysubstance abuse     crack, oxycodone, heroin, etoh   History reviewed. No pertinent past surgical history. Family History  Problem Relation Age of Onset  . Diabetes Other   . Hypertension Other   . Cancer Other   . Heart failure Other    History  Substance Use Topics  . Smoking status: Current Every Day Smoker -- 0.50 packs/day    Types: Cigarettes  . Smokeless tobacco: Not on file  . Alcohol Use: 18.0 oz/week    30 Cans of beer per week    Review of Systems  Constitutional: Negative for appetite change and fatigue.  HENT: Negative for congestion, ear discharge and sinus pressure.   Eyes: Negative for discharge.  Respiratory: Negative for cough.   Cardiovascular: Negative for chest pain.  Gastrointestinal: Negative for abdominal pain and diarrhea.  Genitourinary: Negative for frequency and hematuria.  Musculoskeletal: Negative for back pain.  Skin: Negative for rash.  Neurological: Negative for seizures and headaches.  Psychiatric/Behavioral: Positive for agitation. Negative for hallucinations.      Allergies  Review of  patient's allergies indicates no known allergies.  Home Medications   Prior to Admission medications   Medication Sig Start Date End Date Taking? Authorizing Provider  DULoxetine (CYMBALTA) 30 MG capsule Take 30 mg by mouth daily.   Yes Historical Provider, MD  ALPRAZolam Prudy Feeler(XANAX) 0.5 MG tablet Take 1 tablet (0.5 mg total) by mouth 2 (two) times daily as needed for anxiety. Patient not taking: Reported on 06/24/2014 04/27/14   Janne NapoleonHope M Neese, NP  doxycycline (VIBRA-TABS) 100 MG tablet Take 1 tablet (100 mg total) by mouth 2 (two) times daily. Patient not taking: Reported on 06/24/2014 05/02/14   Burgess AmorJulie Idol, PA-C  HYDROcodone-acetaminophen (NORCO/VICODIN) 5-325 MG per tablet Take 1 tablet by mouth every 4 (four) hours as needed for moderate pain. Patient not taking: Reported on 06/24/2014 05/02/14   Burgess AmorJulie Idol, PA-C  hydrOXYzine (ATARAX/VISTARIL) 25 MG tablet Take one every 6 hours for anxiety 06/24/14   Bethann BerkshireJoseph Kadar Chance, MD   BP 142/97 mmHg  Pulse 105  Temp(Src) 98.3 F (36.8 C) (Oral)  Resp 16  Ht 6' (1.829 m)  Wt 290 lb (131.543 kg)  BMI 39.32 kg/m2  SpO2 100% Physical Exam  Constitutional: He is oriented to person, place, and time. He appears well-developed.  HENT:  Head: Normocephalic.  Eyes: Conjunctivae and EOM are normal. No scleral icterus.  Neck: Neck supple. No thyromegaly present.  Cardiovascular: Regular rhythm.  Exam reveals no gallop and no friction rub.   No murmur heard. Rapid heart rate  Pulmonary/Chest: No stridor. He has no wheezes.  He has no rales. He exhibits no tenderness.  Abdominal: He exhibits no distension. There is no tenderness. There is no rebound.  Musculoskeletal: Normal range of motion. He exhibits no edema.  Lymphadenopathy:    He has no cervical adenopathy.  Neurological: He is oriented to person, place, and time. He exhibits normal muscle tone. Coordination normal.  Skin: No rash noted. No erythema.  Psychiatric:  anxiety    ED Course  Procedures  (including critical care time) Labs Review Labs Reviewed - No data to display  Imaging Review No results found.   EKG Interpretation None      MDM   Final diagnoses:  Anxiety    tx with vistaril and follow up   Bethann Berkshire, MD 06/24/14 1958

## 2014-07-29 ENCOUNTER — Encounter (HOSPITAL_COMMUNITY): Payer: Self-pay

## 2014-07-29 ENCOUNTER — Emergency Department (HOSPITAL_COMMUNITY)
Admission: EM | Admit: 2014-07-29 | Discharge: 2014-07-29 | Disposition: A | Discharge status: Other Acute Inpatient Hospital | Payer: Federal, State, Local not specified - PPO | Attending: Emergency Medicine | Admitting: Emergency Medicine

## 2014-07-29 ENCOUNTER — Inpatient Hospital Stay (HOSPITAL_COMMUNITY)
Admission: EM | Admit: 2014-07-29 | Discharge: 2014-08-03 | DRG: 885 | Disposition: A | Discharge status: Home / Self Care | Payer: Federal, State, Local not specified - PPO | Source: Intra-hospital | Attending: Psychiatry | Admitting: Psychiatry

## 2014-07-29 DIAGNOSIS — F1020 Alcohol dependence, uncomplicated: Secondary | ICD-10-CM | POA: Diagnosis present

## 2014-07-29 DIAGNOSIS — Z8639 Personal history of other endocrine, nutritional and metabolic disease: Secondary | ICD-10-CM | POA: Insufficient documentation

## 2014-07-29 DIAGNOSIS — R000 Tachycardia, unspecified: Secondary | ICD-10-CM | POA: Diagnosis not present

## 2014-07-29 DIAGNOSIS — Z792 Long term (current) use of antibiotics: Secondary | ICD-10-CM | POA: Insufficient documentation

## 2014-07-29 DIAGNOSIS — F17210 Nicotine dependence, cigarettes, uncomplicated: Secondary | ICD-10-CM | POA: Diagnosis present

## 2014-07-29 DIAGNOSIS — F1910 Other psychoactive substance abuse, uncomplicated: Secondary | ICD-10-CM | POA: Diagnosis present

## 2014-07-29 DIAGNOSIS — F329 Major depressive disorder, single episode, unspecified: Secondary | ICD-10-CM | POA: Diagnosis present

## 2014-07-29 DIAGNOSIS — F410 Panic disorder [episodic paroxysmal anxiety] without agoraphobia: Secondary | ICD-10-CM | POA: Diagnosis present

## 2014-07-29 DIAGNOSIS — E785 Hyperlipidemia, unspecified: Secondary | ICD-10-CM | POA: Diagnosis present

## 2014-07-29 DIAGNOSIS — F10239 Alcohol dependence with withdrawal, unspecified: Secondary | ICD-10-CM | POA: Diagnosis present

## 2014-07-29 DIAGNOSIS — Z833 Family history of diabetes mellitus: Secondary | ICD-10-CM

## 2014-07-29 DIAGNOSIS — Z79899 Other long term (current) drug therapy: Secondary | ICD-10-CM | POA: Diagnosis not present

## 2014-07-29 DIAGNOSIS — F1994 Other psychoactive substance use, unspecified with psychoactive substance-induced mood disorder: Secondary | ICD-10-CM | POA: Diagnosis not present

## 2014-07-29 DIAGNOSIS — Z8249 Family history of ischemic heart disease and other diseases of the circulatory system: Secondary | ICD-10-CM

## 2014-07-29 DIAGNOSIS — F10230 Alcohol dependence with withdrawal, uncomplicated: Secondary | ICD-10-CM | POA: Diagnosis not present

## 2014-07-29 DIAGNOSIS — F332 Major depressive disorder, recurrent severe without psychotic features: Secondary | ICD-10-CM | POA: Diagnosis present

## 2014-07-29 DIAGNOSIS — F10280 Alcohol dependence with alcohol-induced anxiety disorder: Secondary | ICD-10-CM

## 2014-07-29 DIAGNOSIS — F10229 Alcohol dependence with intoxication, unspecified: Secondary | ICD-10-CM | POA: Diagnosis present

## 2014-07-29 DIAGNOSIS — G4700 Insomnia, unspecified: Secondary | ICD-10-CM | POA: Diagnosis present

## 2014-07-29 DIAGNOSIS — F1420 Cocaine dependence, uncomplicated: Secondary | ICD-10-CM | POA: Diagnosis present

## 2014-07-29 DIAGNOSIS — R45851 Suicidal ideations: Secondary | ICD-10-CM | POA: Diagnosis present

## 2014-07-29 DIAGNOSIS — F32A Depression, unspecified: Secondary | ICD-10-CM

## 2014-07-29 DIAGNOSIS — Z720 Tobacco use: Secondary | ICD-10-CM | POA: Insufficient documentation

## 2014-07-29 DIAGNOSIS — Y906 Blood alcohol level of 120-199 mg/100 ml: Secondary | ICD-10-CM | POA: Diagnosis present

## 2014-07-29 DIAGNOSIS — Z046 Encounter for general psychiatric examination, requested by authority: Secondary | ICD-10-CM | POA: Diagnosis present

## 2014-07-29 DIAGNOSIS — Z811 Family history of alcohol abuse and dependence: Secondary | ICD-10-CM

## 2014-07-29 DIAGNOSIS — F1410 Cocaine abuse, uncomplicated: Secondary | ICD-10-CM | POA: Insufficient documentation

## 2014-07-29 DIAGNOSIS — F10939 Alcohol use, unspecified with withdrawal, unspecified: Secondary | ICD-10-CM | POA: Diagnosis present

## 2014-07-29 DIAGNOSIS — F10980 Alcohol use, unspecified with alcohol-induced anxiety disorder: Secondary | ICD-10-CM | POA: Diagnosis not present

## 2014-07-29 HISTORY — DX: Major depressive disorder, single episode, unspecified: F32.9

## 2014-07-29 HISTORY — DX: Depression, unspecified: F32.A

## 2014-07-29 LAB — COMPREHENSIVE METABOLIC PANEL
ALK PHOS: 92 U/L (ref 38–126)
ALT: 74 U/L — ABNORMAL HIGH (ref 17–63)
AST: 52 U/L — ABNORMAL HIGH (ref 15–41)
Albumin: 4.6 g/dL (ref 3.5–5.0)
Anion gap: 13 (ref 5–15)
BUN: 14 mg/dL (ref 6–20)
CO2: 21 mmol/L — ABNORMAL LOW (ref 22–32)
CREATININE: 0.97 mg/dL (ref 0.61–1.24)
Calcium: 9.3 mg/dL (ref 8.9–10.3)
Chloride: 104 mmol/L (ref 101–111)
Glucose, Bld: 125 mg/dL — ABNORMAL HIGH (ref 65–99)
Potassium: 3.7 mmol/L (ref 3.5–5.1)
Sodium: 138 mmol/L (ref 135–145)
Total Bilirubin: 0.4 mg/dL (ref 0.3–1.2)
Total Protein: 8.1 g/dL (ref 6.5–8.1)

## 2014-07-29 LAB — URINALYSIS, ROUTINE W REFLEX MICROSCOPIC
BILIRUBIN URINE: NEGATIVE
GLUCOSE, UA: NEGATIVE mg/dL
Ketones, ur: NEGATIVE mg/dL
LEUKOCYTES UA: NEGATIVE
Nitrite: NEGATIVE
PH: 6.5 (ref 5.0–8.0)
Protein, ur: NEGATIVE mg/dL
Specific Gravity, Urine: <1.005 — ABNORMAL LOW (ref 1.005–1.030)
UROBILINOGEN UA: 0.2 mg/dL (ref 0.0–1.0)

## 2014-07-29 LAB — URINE MICROSCOPIC-ADD ON

## 2014-07-29 LAB — CBC WITH DIFFERENTIAL/PLATELET
BASOS ABS: 0 10*3/uL (ref 0.0–0.1)
Basophils Relative: 0 % (ref 0–1)
Eosinophils Absolute: 0.1 10*3/uL (ref 0.0–0.7)
Eosinophils Relative: 1 % (ref 0–5)
HEMATOCRIT: 47.9 % (ref 39.0–52.0)
Hemoglobin: 17 g/dL (ref 13.0–17.0)
LYMPHS ABS: 2.1 10*3/uL (ref 0.7–4.0)
Lymphocytes Relative: 27 % (ref 12–46)
MCH: 31.8 pg (ref 26.0–34.0)
MCHC: 35.5 g/dL (ref 30.0–36.0)
MCV: 89.7 fL (ref 78.0–100.0)
MONOS PCT: 7 % (ref 3–12)
Monocytes Absolute: 0.6 10*3/uL (ref 0.1–1.0)
NEUTROS ABS: 5.1 10*3/uL (ref 1.7–7.7)
Neutrophils Relative %: 65 % (ref 43–77)
Platelets: 221 10*3/uL (ref 150–400)
RBC: 5.34 MIL/uL (ref 4.22–5.81)
RDW: 12.9 % (ref 11.5–15.5)
WBC: 7.9 10*3/uL (ref 4.0–10.5)

## 2014-07-29 LAB — RAPID URINE DRUG SCREEN, HOSP PERFORMED
Amphetamines: NOT DETECTED
BARBITURATES: NOT DETECTED
Benzodiazepines: NOT DETECTED
Cocaine: POSITIVE — AB
Opiates: NOT DETECTED
Tetrahydrocannabinol: NOT DETECTED

## 2014-07-29 LAB — ACETAMINOPHEN LEVEL

## 2014-07-29 LAB — ETHANOL: Alcohol, Ethyl (B): 188 mg/dL — ABNORMAL HIGH (ref <5)

## 2014-07-29 LAB — SALICYLATE LEVEL

## 2014-07-29 MED ORDER — HYDROXYZINE HCL 25 MG PO TABS: 25.0000 mg | ORAL_TABLET | Freq: Four times a day (QID) | ORAL | Status: DC | PRN

## 2014-07-29 MED ORDER — NICOTINE 21 MG/24HR TD PT24
21.0000 mg | MEDICATED_PATCH | Freq: Every day | TRANSDERMAL | Status: DC
  Administered 2014-07-29 – 2014-08-03 (×6): 21 mg via TRANSDERMAL
  Filled 2014-07-29: qty 14
  Filled 2014-07-29 (×7): qty 1

## 2014-07-29 MED ORDER — ONDANSETRON 4 MG PO TBDP: 4.0000 mg | ORAL_TABLET | Freq: Four times a day (QID) | ORAL | Status: AC | PRN

## 2014-07-29 MED ORDER — ACETAMINOPHEN 325 MG PO TABS: 650.0000 mg | ORAL_TABLET | ORAL | Status: DC | PRN

## 2014-07-29 MED ORDER — LORAZEPAM 1 MG PO TABS
1.0000 mg | ORAL_TABLET | Freq: Every day | ORAL | Status: AC
  Administered 2014-08-02: 1 mg via ORAL
  Filled 2014-07-29: qty 1

## 2014-07-29 MED ORDER — NICOTINE 21 MG/24HR TD PT24
21.0000 mg | MEDICATED_PATCH | Freq: Every day | TRANSDERMAL | Status: DC
  Administered 2014-07-29: 21 mg via TRANSDERMAL
  Filled 2014-07-29: qty 1

## 2014-07-29 MED ORDER — IBUPROFEN 400 MG PO TABS: 600.0000 mg | ORAL_TABLET | Freq: Three times a day (TID) | ORAL | Status: DC | PRN

## 2014-07-29 MED ORDER — ALUM & MAG HYDROXIDE-SIMETH 200-200-20 MG/5ML PO SUSP: 30.0000 mL | ORAL | Status: DC | PRN

## 2014-07-29 MED ORDER — LORAZEPAM 1 MG PO TABS
1.0000 mg | ORAL_TABLET | Freq: Four times a day (QID) | ORAL | Status: AC | PRN
  Administered 2014-07-31 – 2014-08-01 (×2): 1 mg via ORAL
  Filled 2014-07-29 (×2): qty 1

## 2014-07-29 MED ORDER — LORAZEPAM 1 MG PO TABS
1.0000 mg | ORAL_TABLET | Freq: Two times a day (BID) | ORAL | Status: AC
  Administered 2014-07-31 – 2014-08-01 (×2): 1 mg via ORAL
  Filled 2014-07-29 (×2): qty 1

## 2014-07-29 MED ORDER — LOPERAMIDE HCL 2 MG PO CAPS: 2.0000 mg | ORAL_CAPSULE | ORAL | Status: AC | PRN

## 2014-07-29 MED ORDER — LORAZEPAM 1 MG PO TABS
1.0000 mg | ORAL_TABLET | Freq: Four times a day (QID) | ORAL | Status: AC
  Administered 2014-07-29 – 2014-07-30 (×4): 1 mg via ORAL
  Filled 2014-07-29 (×4): qty 1

## 2014-07-29 MED ORDER — TRAZODONE HCL 50 MG PO TABS
50.0000 mg | ORAL_TABLET | Freq: Every evening | ORAL | Status: DC | PRN
  Administered 2014-07-29: 50 mg via ORAL
  Filled 2014-07-29: qty 1

## 2014-07-29 MED ORDER — VITAMIN B-1 100 MG PO TABS
100.0000 mg | ORAL_TABLET | Freq: Every day | ORAL | Status: DC
  Administered 2014-07-30 – 2014-08-03 (×5): 100 mg via ORAL
  Filled 2014-07-29 (×7): qty 1

## 2014-07-29 MED ORDER — HYDROXYZINE HCL 25 MG PO TABS
25.0000 mg | ORAL_TABLET | Freq: Four times a day (QID) | ORAL | Status: AC | PRN
  Administered 2014-07-31 – 2014-08-01 (×2): 25 mg via ORAL
  Filled 2014-07-29 (×2): qty 1

## 2014-07-29 MED ORDER — ONDANSETRON HCL 4 MG PO TABS: 4.0000 mg | ORAL_TABLET | Freq: Three times a day (TID) | ORAL | Status: DC | PRN

## 2014-07-29 MED ORDER — LORAZEPAM 1 MG PO TABS: 0.0000 mg | ORAL_TABLET | Freq: Four times a day (QID) | ORAL | Status: DC

## 2014-07-29 MED ORDER — ACETAMINOPHEN 325 MG PO TABS
650.0000 mg | ORAL_TABLET | Freq: Four times a day (QID) | ORAL | Status: DC | PRN
Start: 2014-07-29 — End: 2014-08-03

## 2014-07-29 MED ORDER — MAGNESIUM HYDROXIDE 400 MG/5ML PO SUSP: 30.0000 mL | Freq: Every day | ORAL | Status: DC | PRN

## 2014-07-29 MED ORDER — ADULT MULTIVITAMIN W/MINERALS CH
1.0000 | ORAL_TABLET | Freq: Every day | ORAL | Status: DC
  Administered 2014-07-29 – 2014-08-03 (×6): 1 via ORAL
  Filled 2014-07-29 (×8): qty 1

## 2014-07-29 MED ORDER — SODIUM CHLORIDE 0.9 % IV BOLUS (SEPSIS)
1000.0000 mL | Freq: Once | INTRAVENOUS | Status: AC
  Administered 2014-07-29: 1000 mL via INTRAVENOUS

## 2014-07-29 MED ORDER — LORAZEPAM 2 MG/ML IJ SOLN
2.0000 mg | Freq: Once | INTRAMUSCULAR | Status: AC
  Administered 2014-07-29: 2 mg via INTRAVENOUS
  Filled 2014-07-29: qty 1

## 2014-07-29 MED ORDER — LORAZEPAM 1 MG PO TABS: 0.0000 mg | ORAL_TABLET | Freq: Two times a day (BID) | ORAL | Status: DC

## 2014-07-29 MED ORDER — LORAZEPAM 1 MG PO TABS
1.0000 mg | ORAL_TABLET | Freq: Three times a day (TID) | ORAL | Status: AC
  Administered 2014-07-30 – 2014-07-31 (×3): 1 mg via ORAL
  Filled 2014-07-29 (×4): qty 1

## 2014-07-29 NOTE — ED Notes (Signed)
Pt. Mother at bedside.  

## 2014-07-29 NOTE — ED Notes (Signed)
All of pts. Belongings sent home with pts. Mother per pt. Request.

## 2014-07-29 NOTE — ED Notes (Signed)
   07/29/14 0351  Psych Admission Type (Psych Patients Only)  Admission Status Voluntary  Suicide Risk Assessment  Charting Type Initial  Recently experienced and/or been treated for a psychiatric disorder, including depression or anxiety? Yes  Recently experienced a loss/disruption in support system? No  Substance abuse history and/or treatment for substance abuse? Yes  Expressing Hopelessness? Yes  Attempted suicide within last 30 days? No  Attempting or threatening harm to self or others? No  Expressing thoughts of harming self or others without intent? Yes  Suicide Risk  Is patient at risk for harming self or others? Yes  Suicide Precautions  Suicide Precautions Initiated and Ordered  Suicide precaution notifications Administrative Coordinator or designees/House Coverage;Charge Nurse;Physician;Security  Environmental room search completed Psychiatrist(Sitter to log-in and comment they were present if applicable) Initial  Suicide Precaution Interventions Trained Sitter has patient within sight;Patient placed in paper scrubs;Wanded by security  Patient Activity Awake  Patient Behaviors Appropriate for age;Appropriate for situation;Cooperative;Anxious  Psychosocial Interventions   Adjusted environment  Thoughts of harming self or others Ideation of self harm or harming others  Self Injurious Behaviors None observed  Harmful Actions Toward Others None observed  Pt states he has used $100 worth of crack tonight the last hit was just after 0300. Pt admits to drinking & has the smell of ETOH. Pt says he has thought about walking into traffic & laying down on the train tracks. Pt states he has anixity issues & has had trouble after finding his brother dead from a heroin overdose.

## 2014-07-29 NOTE — BH Assessment (Signed)
Tele Assessment Note   Jeffery Hancock is a 24 y.o. male who voluntarily presents to [facility] with SI/Depression and anxiety.  Pt reports the following: says his current emotional state is triggered by the image he has in head of finding his younger brother dead of a heroin overdose in 07/29/13 and his mother's boyfriend's son overdose on heroin on 07/28/14, he survived.  Pt no longer endorses SI, but states he had a plan upon arrival to the emerg dept to jump in front of a car or train.  Pt reports substance abuse problems and is requesting help--he drinks 1-12pk of alcohol, daily, his last drink was 07/28/14. He smokes $100 worth of crack/cocaine, daily.  His last use was 07/29/14.  He admits occasional use of benzos, 2-3mg s at least 1x per month.  He smokes marijuana if he with someone using the product, his last use was last July.  Pt is visibly anxious and upset, he denies HI/AVH, no past inpt admissions and no current outpt services.    Axis I: Major Depression, single episode, Substance Induced Mood Disorder and Alcohol use disorder, Severe; Cocaine use disorder, Severe; Cannabis use disorder, Mild;Sedative, hypnotic, or anxiolytic use disorder, Mild  Axis II: Deferred Axis III:  Past Medical History  Diagnosis Date  . Panic attacks   . Anxiety   . Hyperlipemia   . Alcohol abuse   . Polysubstance abuse     crack, oxycodone, heroin, etoh  . Depression    Axis IV: other psychosocial or environmental problems, problems related to legal system/crime, problems related to social environment and problems with primary support group Axis V: 31-40 impairment in reality testing  Past Medical History:  Past Medical History  Diagnosis Date  . Panic attacks   . Anxiety   . Hyperlipemia   . Alcohol abuse   . Polysubstance abuse     crack, oxycodone, heroin, etoh  . Depression     History reviewed. No pertinent past surgical history.  Family History:  Family History  Problem Relation Age of Onset   . Diabetes Other   . Hypertension Other   . Cancer Other   . Heart failure Other     Social History:  reports that he has been smoking Cigarettes.  He has been smoking about 0.50 packs per day. He does not have any smokeless tobacco history on file. He reports that he drinks about 18.0 oz of alcohol per week. He reports that he uses illicit drugs (Cocaine, IV, Marijuana, and Benzodiazepines).  Additional Social History:  Alcohol / Drug Use Pain Medications: See MAR  Prescriptions: See MAR  Over the Counter: See MAR  History of alcohol / drug use?: Yes Longest period of sobriety (when/how long): None  Negative Consequences of Use: Work / Programmer, multimedia, Copywriter, advertising relationships, Armed forces operational officer, Surveyor, quantity Withdrawal Symptoms: Other (Comment) (No current w/d sxs ) Substance #1 Name of Substance 1: Alcohol  1 - Age of First Use: Teens  1 - Amount (size/oz): 12Pk  1 - Frequency: Daily  1 - Duration: On-going  1 - Last Use / Amount: 07/29/14 Substance #2 Name of Substance 2: THC  2 - Age of First Use: Teens  2 - Amount (size/oz): Varies  2 - Frequency: Occasional  2 - Duration: On-going  2 - Last Use / Amount: 2015 Substance #3 Name of Substance 3: Crack/Cocaine  3 - Age of First Use: 30[birth year]2022  3 - Amount (size/oz): $100 3 - Frequency: Daily  3 - Duration: On-going  3 -  Last Use / Amount: 07/29/14 Substance #4 Name of Substance 4: Benzos-Xanax  4 - Age of First Use: [birth year]  4 - Amount (size/oz): 2-3mg s  4 - Frequency: Montly  4 - Duration: On-going  4 - Last Use / Amount: Unk   CIWA: CIWA-Ar BP: 132/76 mmHg Pulse Rate: (!) 121 Nausea and Vomiting: no nausea and no vomiting Tactile Disturbances: none Tremor: no tremor Auditory Disturbances: not present Paroxysmal Sweats: no sweat visible Visual Disturbances: not present Anxiety: no anxiety, at ease Headache, Fullness in Head: none present Agitation: normal activity Orientation and Clouding of Sensorium: oriented and can do serial  additions CIWA-Ar Total: 0 COWS:    PATIENT STRENGTHS: (choose at least two) Communication skills Motivation for treatment/growth  Allergies: No Known Allergies  Home Medications:  (Not in a hospital admission)  OB/GYN Status:  No LMP for male patient.  General Assessment Data Location of Assessment: [facility] TTS Assessment: In system Is this a Tele or Face-to-Face Assessment?: Tele Assessment Is this an Initial Assessment or a Re-assessment for this encounter?: Initial Assessment Marital status: Single Maiden name: None  Is patient pregnant?: No Pregnancy Status: No Living Arrangements: Alone Can pt return to current living arrangement?: Yes Admission Status: Voluntary Is patient capable of signing voluntary admission?: Yes Referral Source: MD Insurance type: BCBS  Medical Screening Exam [facility] Walk-in ONLY) Medical Exam completed: No Reason for MSE not completed: Other: (None )  Crisis Care Plan Living Arrangements: Alone Name of Psychiatrist: None  Name of Therapist: None   Education Status Is patient currently in school?: No Current Grade: None  Highest grade of school patient has completed: None  Name of school: None  Contact person: None   Risk to self with the past 6 months Suicidal Ideation: Yes-Currently Present Has patient been a risk to self within the past 6 months prior to admission? : No Suicidal Intent: Yes-Currently Present Has patient had any suicidal intent within the past 6 months prior to admission? : No Is patient at risk for suicide?: Yes Suicidal Plan?: Yes-Currently Present Has patient had any suicidal plan within the past 6 months prior to admission? : No Specify Current Suicidal Plan: Jump in front of a vehicle or train  Access to Means: Yes Specify Access to Suicidal Means: Train/Cars What has been your use of drugs/alcohol within the last 12 months?: Abusing: thc, cocaine, benzos, alcohol  Previous Attempts/Gestures: No How many  times?: 0 Other Self Harm Risks: None  Triggers for Past Attempts: None known Intentional Self Injurious Behavior: None Family Suicide History: No Recent stressful life event(s): Loss (Comment) (Brother died of heroin od in 2013/07/24) Persecutory voices/beliefs?: No Depression: Yes Depression Symptoms: Loss of interest in usual pleasures, Feeling worthless/self pity Substance abuse history and/or treatment for substance abuse?: Yes Suicide prevention information given to non-admitted patients: Not applicable  Risk to Others within the past 6 months Homicidal Ideation: No Does patient have any lifetime risk of violence toward others beyond the six months prior to admission? : No Thoughts of Harm to Others: No Current Homicidal Intent: No Current Homicidal Plan: No Access to Homicidal Means: No Identified Victim: None  History of harm to others?: No Assessment of Violence: None Noted Violent Behavior Description: None  Does patient have access to weapons?: No Criminal Charges Pending?: Yes Describe Pending Criminal Charges: Warrant for arrest in [place]  Does patient have a court date: No Is patient on probation?: Unknown  Psychosis Hallucinations: None noted Delusions: None noted  Mental Status Report Appearance/Hygiene: In scrubs Eye Contact: Fair Motor Activity: Unremarkable Speech: Logical/coherent Level of Consciousness: Alert Mood: Depressed, Sad, Anxious Affect: Anxious, Depressed, Sad Anxiety Level: Minimal Thought Processes: Relevant, Coherent Judgement: Impaired Orientation: Person, Place, Time, Situation Obsessive Compulsive Thoughts/Behaviors: None  Cognitive Functioning Concentration: Normal Memory: Recent Intact, Remote Intact IQ: Average Insight: Poor Impulse Control: Poor Appetite: Fair Weight Loss: 0 Weight Gain: 0 Sleep: No Change Total Hours of Sleep: 5 Vegetative Symptoms: None  ADLScreening [facility] Assessment Services) Patient's cognitive  ability adequate to safely complete daily activities?: Yes Patient able to express need for assistance with ADLs?: Yes Independently performs ADLs?: Yes (appropriate for developmental age)  Prior Inpatient Therapy Prior Inpatient Therapy: No Prior Therapy Dates: None  Prior Therapy Facilty/Provider(s): None  Reason for Treatment: None   Prior Outpatient Therapy Prior Outpatient Therapy: No Prior Therapy Dates: None  Prior Therapy Facilty/Provider(s): None  Reason for Treatment: None  Does patient have an ACCT team?: No Does patient have Intensive In-House Services?  : No Does patient have [facility] services? : No Does patient have P4CC services?: No  ADL Screening (condition at time of admission) Patient's cognitive ability adequate to safely complete daily activities?: Yes Is the patient deaf or have difficulty hearing?: No Does the patient have difficulty seeing, even when wearing glasses/contacts?: No Does the patient have difficulty concentrating, remembering, or making decisions?: No Patient able to express need for assistance with ADLs?: Yes Does the patient have difficulty dressing or bathing?: No Independently performs ADLs?: Yes (appropriate for developmental age) Does the patient have difficulty walking or climbing stairs?: No Weakness of Legs: None Weakness of Arms/Hands: None  Home Assistive Devices/Equipment Home Assistive Devices/Equipment: None  Therapy Consults (therapy consults require a physician order) PT Evaluation Needed: No OT Evalulation Needed: No SLP Evaluation Needed: No Abuse/Neglect Assessment (Assessment to be complete while patient is alone) Physical Abuse: Denies Verbal Abuse: Denies Sexual Abuse: Denies Exploitation of patient/patient's resources: Denies Self-Neglect: Denies Values / Beliefs Cultural Requests During Hospitalization: None Spiritual Requests During Hospitalization: None Consults Spiritual Care Consult Needed: No Social  Work Consult Needed: No Merchant navy officerAdvance Directives (For Healthcare) Does patient have an advance directive?: No Would patient like information on creating an advanced directive?: No - patient declined information    Additional Information 1:1 In Past 12 Months?: No CIRT Risk: No Elopement Risk: No Does patient have medical clearance?: Yes     Disposition:  Disposition Initial Assessment Completed for this Encounter: Yes Disposition of Patient: Referred to (Per Donell SievertSpencer [name], PA, meets criteria for inpt admission ) Patient referred to: Other (Comment) (Per Donell SievertSpencer [name], PA meets criteria for inpt admission )  Murrell ReddenSimmons, Maylin Freeburg C 07/29/2014 6:05 AM

## 2014-07-29 NOTE — Progress Notes (Signed)
Patient ID: Jeffery Hancock, male   DOB: 05/01/1990, 24 y.o.   MRN: [id number]  Initial Interdisciplinary Treatment Plan   PATIENT STRESSORS: Loss of brother Substance abuse Traumatic Event  PATIENT STRENGTHS: Ability for insight Average or above average intelligence Capable of independent living Communication skills Motivation for treatment/growth Physical Health   PROBLEM LIST: Problem List/Patient Goals Date to be addressed Date deferred Reason deferred Estimated date of resolution  "I have bad anxiety" - anxiety 07/29/2014  07/29/2014    "I want to quit drinking" - substance abuse 07/29/2014 07/29/2014    "I have these racing thoughts about my brother" - depression 07/29/2014 07/29/2014                                         DISCHARGE CRITERIA:  Improved stabilization in mood, thinking, and/or behavior Reduction of life-threatening or endangering symptoms to within safe limits Verbal commitment to aftercare and medication compliance Withdrawal symptoms are absent or subacute and managed without 24-hour nursing intervention  PRELIMINARY DISCHARGE PLAN: Attend aftercare/continuing care group Attend 12-step recovery group Return to previous living arrangement  PATIENT/FAMIILY INVOLVEMENT: This treatment plan has been presented to and reviewed with the patient, Jeffery Hancock .  The patient and family have been given the opportunity to ask questions and make suggestions.  Aurora Maskwyman, Vasily Fedewa E 07/29/2014, 2:15 PM

## 2014-07-29 NOTE — [facility] Group Notes (Signed)
[facility] LCSW Group Therapy  [facility] 1:15 - 2:30 PM  07/29/2014 2:49 PM   Type of Therapy:  Group Therapy  Participation Level:  Patient new to [facility] - did not attend group.  Wynn BankerHodnett, Jonai Weyland Hairston 07/29/2014 2:49 PM

## 2014-07-29 NOTE — Progress Notes (Signed)
[facility] per Tanna SavoyEric, AC, Dr. Jama Flavorsobos. To be transported after 11am. Admission is voluntary.  Spoke with [facility] Charge RN re: pt's placement.  Ilean SkillMeghan Jerra Huckeby, MSW, LCSWA Clinical Social Work, Disposition  07/29/2014 [phone]

## 2014-07-29 NOTE — ED Notes (Signed)
Pt is very emotional, states he has been having a lot of trouble since he found his younger brother dead from a heroin overdose almost a year ago. Pt admits to suicidal ideation and thoughts about walking into traffic or lying down on a railroad track.   Pt is very anxious, states he "smoked one hundred dollars worth of crack tonight"   Pt states he requested a police officer to bring him to the hospital for help.

## 2014-07-29 NOTE — Progress Notes (Signed)
Recreation Therapy Notes  Animal-Assisted Activity (AAA) Program Checklist/Progress Notes Patient Eligibility Criteria Checklist & Daily Group note for Rec Tx Intervention  Date: 05.26.16 Time: 2:45pm Location: [address] Dayroom   AAA/T Program Assumption of Risk Form signed by Patient/ or Parent Legal Guardian yes  Patient is free of allergies or sever asthma yes  Patient reports no fear of animals yes  Patient reports no history of cruelty to animalsyes  Patient understands his/her participation is voluntary yes  Patient washes hands before animal contact yes  Patient washes hands after animal contact yes  Education: Hand Washing, Appropriate Animal Interaction   Education Outcome: Acknowledges understanding/In group clarification offered/Needs additional education.   Clinical Observations/Feedback: Patient did not attend group.  Armel Rabbani, LRT/CTRS         Gerber Penza A 07/29/2014 4:10 PM 

## 2014-07-29 NOTE — Plan of Care (Signed)
Problem: Alteration in mood & ability to function due to Goal: STG-Patient will attend groups Outcome: Not Progressing Pt has remained in bed since admission

## 2014-07-29 NOTE — ED Notes (Signed)
Pt. Mother Jeffery Hancock [phone]

## 2014-07-29 NOTE — ED Notes (Signed)
Pt allowed to use his cell phone text mother & pastor to his whereabouts & what is going on. Cell phone placed back in locker after use.

## 2014-07-29 NOTE — Progress Notes (Addendum)
Patient ID: Jeffery Hancock, male   DOB: 03/08/1990, 24 y.o.   MRN: [id number]  24 year old [facility] due to increased depression and c/o alcohol and crack cocaine abuse. Pt stated "I was so drunk I walked to the ED. My brother died last July and I keep thinking these thoughts. Thinking about when I walked in and tried to wake him up." Pt reports that he has been consulting with his PCP about his depression. Two weeks ago they switched pt from an anti-depressant he does not remember the name of to 30mg  of Cymbalta "for anxiety" Pt has no past medical hx but does say that he has been diagnosed with "depression but mainly anxiety" in the past.  Pt lives in a house alone and has a personal vehicle. Pt recently moved from [place] with mom. Pt denies any support systems in [place] but says "my mom is here." Pt reports having friends in [place] but not in [place]. Pt would rather his dad be his Emergency Contact however he does not have his cell phone and cannot remember his phone number.   Pt oriented to unit, skin and belonging searches completed and consents signed. Pt given opportunity to ask questions/express concerns. Pt's safety ensured with 15 minute and environmental checks. Pt currently denies SI/HI and A/V hallucinations. Pt verbally agrees to seek staff if SI/HI or A/VH occurs and to consult with staff before acting on these thoughts. Pt given toiletries and is currently stable. Will continue to monitor.

## 2014-07-29 NOTE — ED Provider Notes (Signed)
CSN: [id number]     Arrival date & time 07/29/14  4098 History   First MD Initiated Contact with Patient 07/29/14 [phone]     Chief Complaint  Patient presents with  . V70.1     (Consider location/radiation/quality/duration/timing/severity/associated sxs/prior Treatment) HPI Comments: Patient presents to the emergency department for evaluation of depression. Patient reports that he has had progressive worsening of depression over the last several months. He has a history of anxiety and panic attacks. His anxiety has been severe. He also admits to alcoholism and crack cocaine abuse. He does have a history of heroin use in the past, but this has not been active for some time.  Patient reports that he tried to go to detox for alcohol proximally month ago, but was not successful in staying sober. He was drinking alcohol tonight and smoking $100 worth of crack.  At arrival, patient reports that he has been feeling extremely depressed. He has been thinking about killing himself by walking into traffic or lying down on a railroad track.  He feels extremely anxious at arrival to the ER. He came to the ER 3 times tonight, but was afraid to come inside. He eventually asked the police officer to bring him to the ER for help. He is not experiencing any chest pain or shortness of breath.   Past Medical History  Diagnosis Date  . Panic attacks   . Anxiety   . Hyperlipemia   . Alcohol abuse   . Polysubstance abuse     crack, oxycodone, heroin, etoh  . Depression    History reviewed. No pertinent past surgical history. Family History  Problem Relation Age of Onset  . Diabetes Other   . Hypertension Other   . Cancer Other   . Heart failure Other    History  Substance Use Topics  . Smoking status: Current Every Day Smoker -- 0.50 packs/day    Types: Cigarettes  . Smokeless tobacco: Not on file  . Alcohol Use: 18.0 oz/week    30 Cans of beer per week    Review of Systems   Psychiatric/Behavioral: Positive for suicidal ideas. The patient is nervous/anxious.   All other systems reviewed and are negative.     Allergies  Review of patient's allergies indicates no known allergies.  Home Medications   Prior to Admission medications   Medication Sig Start Date End Date Taking? Authorizing Provider  DULoxetine (CYMBALTA) 30 MG capsule Take 30 mg by mouth daily.   Yes Historical Provider, MD  ALPRAZolam Prudy Feeler) 0.5 MG tablet Take 1 tablet (0.5 mg total) by mouth 2 (two) times daily as needed for anxiety. Patient not taking: Reported on 06/24/2014 04/27/14   Janne Napoleon, NP  doxycycline (VIBRA-TABS) 100 MG tablet Take 1 tablet (100 mg total) by mouth 2 (two) times daily. Patient not taking: Reported on 06/24/2014 05/02/14   Burgess Amor, PA-C  HYDROcodone-acetaminophen (NORCO/VICODIN) 5-325 MG per tablet Take 1 tablet by mouth every 4 (four) hours as needed for moderate pain. Patient not taking: Reported on 06/24/2014 05/02/14   Burgess Amor, PA-C  hydrOXYzine (ATARAX/VISTARIL) 25 MG tablet Take one every 6 hours for anxiety 06/24/14   Bethann Berkshire, MD   BP 114/51 mmHg  Pulse 93  Temp(Src) 99.5 F (37.5 C) (Oral)  Resp 23  SpO2 96% Physical Exam  Constitutional: He is oriented to person, place, and time. He appears well-developed and well-nourished. He appears distressed.  HENT:  Head: Normocephalic and atraumatic.  Right Ear: Hearing normal.  Left Ear: Hearing normal.  Nose: Nose normal.  Mouth/Throat: Oropharynx is clear and moist and mucous membranes are normal.  Eyes: Conjunctivae and EOM are normal. Pupils are equal, round, and reactive to light.  Neck: Normal range of motion. Neck supple.  Cardiovascular: Regular rhythm, S1 normal and S2 normal.  Tachycardia present.  Exam reveals no gallop and no friction rub.   No murmur heard. Pulmonary/Chest: Effort normal and breath sounds normal. No respiratory distress. He exhibits no tenderness.  Abdominal: Soft.  Normal appearance and bowel sounds are normal. There is no hepatosplenomegaly. There is no tenderness. There is no rebound, no guarding, no tenderness at McBurney's point and negative Murphy's sign. No hernia.  Musculoskeletal: Normal range of motion.  Neurological: He is alert and oriented to person, place, and time. He has normal strength. No cranial nerve deficit or sensory deficit. Coordination normal. GCS eye subscore is 4. GCS verbal subscore is 5. GCS motor subscore is 6.  Skin: Skin is warm, dry and intact. No rash noted. No cyanosis.  Psychiatric: His speech is normal and behavior is normal. His mood appears anxious. He exhibits a depressed mood. He expresses suicidal ideation. He expresses suicidal plans.  Nursing note and vitals reviewed.   ED Course  Procedures (including critical care time) Labs Review Labs Reviewed  COMPREHENSIVE METABOLIC PANEL - Abnormal; Notable for the following:    CO2 21 (*)    Glucose, Bld 125 (*)    AST 52 (*)    ALT 74 (*)    All other components within normal limits  URINALYSIS, ROUTINE W REFLEX MICROSCOPIC (NOT AT [facility]) - Abnormal; Notable for the following:    Specific Gravity, Urine <1.005 (*)    Hgb urine dipstick TRACE (*)    All other components within normal limits  URINE RAPID DRUG SCREEN (HOSP PERFORMED) NOT AT [facility] - Abnormal; Notable for the following:    Cocaine POSITIVE (*)    All other components within normal limits  ETHANOL - Abnormal; Notable for the following:    Alcohol, Ethyl (B) 188 (*)    All other components within normal limits  ACETAMINOPHEN LEVEL - Abnormal; Notable for the following:    Acetaminophen (Tylenol), Serum <10 (*)    All other components within normal limits  CBC WITH DIFFERENTIAL/PLATELET  SALICYLATE LEVEL  URINE MICROSCOPIC-ADD ON    Imaging Review No results found.   EKG Interpretation   Date/Time:  Thursday Jul 29 2014 03:52:41 EDT Ventricular Rate:  142 PR Interval:  140 QRS Duration:  86 QT Interval:  280 QTC Calculation: 430 R Axis:   73 Text Interpretation:  Sinus tachycardia Probable inferior infarct, old No  significant change since last tracing Confirmed by POLLINA  MD,  CHRISTOPHER [phone]([id number]) on 07/29/2014 4:47:32 AM      MDM   Final diagnoses:  None   depression  Polysubstance abuse  Patient presents to the ER for evaluation of depression. Patient reports that he has become progressively more depressed and is actively suicidal. He also has a history of alcoholism and crack cocaine abuse. Patient admits to both drinking and using crack today. Patient is tearful at arrival. He was noted to be tachycardic, multifactorial secondary to cocaine and use as well as agitation and anxiety. He is not expressing any chest pain. EKG does not show any change since previous, no ischemia or infarct noted on EKG. Patient's blood work otherwise unremarkable. Patient will require psychiatric evaluation. He was placed on CIWA protocol for alcoholism.  He is otherwise medically clear.    Gilda Crease, MD 07/29/14 [phone]

## 2014-07-30 ENCOUNTER — Encounter (HOSPITAL_COMMUNITY): Payer: Self-pay | Admitting: Registered Nurse

## 2014-07-30 DIAGNOSIS — F1910 Other psychoactive substance abuse, uncomplicated: Secondary | ICD-10-CM | POA: Diagnosis present

## 2014-07-30 DIAGNOSIS — R45851 Suicidal ideations: Secondary | ICD-10-CM

## 2014-07-30 DIAGNOSIS — F1994 Other psychoactive substance use, unspecified with psychoactive substance-induced mood disorder: Secondary | ICD-10-CM | POA: Diagnosis present

## 2014-07-30 MED ORDER — DULOXETINE HCL 30 MG PO CPEP
30.0000 mg | ORAL_CAPSULE | Freq: Every day | ORAL | Status: DC
  Administered 2014-07-30: 30 mg via ORAL
  Filled 2014-07-30 (×3): qty 1

## 2014-07-30 MED ORDER — TRAZODONE HCL 100 MG PO TABS
100.0000 mg | ORAL_TABLET | Freq: Every evening | ORAL | Status: DC | PRN
  Administered 2014-07-30 – 2014-08-02 (×4): 100 mg via ORAL
  Filled 2014-07-30: qty 1
  Filled 2014-07-30: qty 4
  Filled 2014-07-30: qty 1
  Filled 2014-07-30: qty 4

## 2014-07-30 MED ORDER — DULOXETINE HCL 60 MG PO CPEP
60.0000 mg | ORAL_CAPSULE | Freq: Every day | ORAL | Status: DC
  Administered 2014-07-31 – 2014-08-02 (×3): 60 mg via ORAL
  Filled 2014-07-30 (×5): qty 1
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
## 2014-07-30 NOTE — [facility] Suicide Risk Assessment (Signed)
[facility] INPATIENT:  Family/Significant Other Suicide Prevention Education  Suicide Prevention Education:  Contact Attempts; Jeffery FramesJodi Hancock, Mother, [phone];  has been identified by the patient as the family member/significant other with whom the patient will be residing, and identified as the person(s) who will aid the patient in the event of a mental health crisis.  With written consent from the patient, two attempts were made to provide suicide prevention education, prior to and/or following the patient's discharge.  We were unsuccessful in providing suicide prevention education.  A suicide education pamphlet was given to the patient to share with family/significant other.  Date and time of first attempt: Message left on mother's voicemail to contact CSW - nonemergency. Date and time of second attempt:  Jeffery BankerHodnett, Jeffery Hancock 07/30/2014, 1:12 PM

## 2014-07-30 NOTE — Progress Notes (Signed)
Adult Psychoeducational Group Note  Date:  07/30/2014 Time:  8:37 PM  Group Topic/Focus:  Wrap-Up Group:   The focus of this group is to help patients review their daily goal of treatment and discuss progress on daily workbooks.  Participation Level:  Active  Participation Quality:  Appropriate  Affect:  Appropriate  Cognitive:  Appropriate  Insight: Appropriate  Engagement in Group:  Engaged  Modes of Intervention:  Discussion  Additional Comments:  Pt stated his goal is to smoke a cigarette since he is preparing to go to a residential facility once he leaves here.  Caswell CorwinOwen, Nasser Ku C 07/30/2014, 8:37 PM

## 2014-07-30 NOTE — Progress Notes (Signed)
Recreation Therapy Notes  Date: 05.27.16 Time: 9:30am Location: 300 Group Room  Group Topic: Stress Management  Goal Area(s) Addresses:  Patient will verbalize importance of using healthy stress management.  Patient will identify positive emotions associated with healthy stress management.   Intervention: Progressive Muscle Relaxation, Guided Imagery Script  Activity :  Progressive Muscle Relaxation & Guided Imagery.  LRT introduced and educated patients on stress management  Techniques of progressive muscle relaxation and guided imagery.  Scripts were used to deliver both techniques to patients, patients were asked to follow the script read allowed by LRT to engage in practicing both stress management techniques.  Education:  Stress Management, Discharge Planning.   Education Outcome: Acknowledges edcuation/In group clarification offered/Needs additional education  Clinical Observations/Feedback: Patient did not attend group.  [name], LRT/CTRS         Jeffery Hancock A 07/30/2014 4:09 PM 

## 2014-07-30 NOTE — [facility] Group Notes (Addendum)
[facility] LCSW Group Therapy  Feelings Around Relapse 1:15 -2:30        07/30/2014   Type of Therapy:  Group Therapy  Participation Level:  Appropriate  Participation Quality:  Appropriate  Affect:  Appropriate  Cognitive:  Attentive Appropriate  Insight:  Developing/Improving  Engagement in Therapy: Developing/Improving  Modes of Intervention:  Discussion Exploration Problem-Solving Supportive  Summary of Progress/Problems:  The topic for today was feelings around relapse.  Patient processed feelings toward relapse and was able to relate to peers. He shared he would be drinking and using crack cocaine if he relapsed. Patient shared he was in for detox not long ago and relapsed within two days of discaharge.Patient identified coping skills that can be used to prevent a relapse.   Wynn BankerHodnett, Yuepheng Schaller Hairston 07/30/2014

## 2014-07-30 NOTE — [facility] Suicide Risk Assessment (Addendum)
[facility] Admission Suicide Risk Assessment   Nursing information obtained from:    Demographic factors:   24 year old man, single, no children, lives alone, employed  Current Mental Status:   see below Loss Factors:   death of friends, death of brother from overdose last year  Historical Factors:   history of alcohol and drug abuse ( cocaine )  Risk Reduction Factors:   resilience  Total Time spent with patient: 45 minutes Principal Problem:  Polysubstance Dependence, Substance Induced Mood Disorder Diagnosis:   Patient Active Problem List   Diagnosis Date Noted  . Polysubstance abuse [F19.10] 07/30/2014  . Substance induced mood disorder [F19.94] 07/30/2014  . Major depression, single episode [F32.9] 07/29/2014     Continued Clinical Symptoms:  Alcohol Use Disorder Identification Test Final Score (AUDIT): 26 The "Alcohol Use Disorders Identification Test", Guidelines for Use in Primary Care, Second Edition.  World Science writer [facility]). Score between 0-7:  no or low risk or alcohol related problems. Score between 8-15:  moderate risk of alcohol related problems. Score between 16-19:  high risk of alcohol related problems. Score 20 or above:  warrants further diagnostic evaluation for alcohol dependence and treatment.   CLINICAL FACTORS:  24 year old man, who reports that in the context of alcohol and cocaine intoxication developed increased depression and suicidal ideations. He was thinking of walking into traffic. He went to a Emergency planning/management officer and requested help, so was brought to hospital. He states he has a long history of substance abuse, and describes a history of abusing alcohol , cocaine, and in the past heroin, ecstasy, cannabis, but no recent use of opiates .  He describes a long history of Anxiety and more specifically of Panic Attacks, which he feels perpetuate/contribute to his ongoing alcohol abuse. He was started on Cymbalta a few weeks ago, and so far has not had any  side effects.  At this time he is doing better , mood is improved, and denies any current suicidal ideations. Currently on alcohol withdrawal protocol, and interested in going to an inpatient rehab upon discharge . Dx- Polysubstance Dependence , Alcohol withdrawal, Substance Induced Mood Disorder , depressed  Plan- continue Alcohol Detox Protocol, continue Cymbalta at 60 mgrs QDAY . Patient interested in getting tested for HIV, Viral Hepatitis .    Musculoskeletal: Strength & Muscle Tone: within normal limits Gait & Station: normal Patient leans: N/A  Psychiatric Specialty Exam: Physical Exam  Review of Systems  Constitutional: Negative.   HENT: Negative.   Eyes: Negative.   Respiratory: Negative.   Cardiovascular: Negative.   Gastrointestinal: Negative.   Genitourinary: Negative.   Musculoskeletal: Negative.   Skin: Negative.   Neurological: Negative.  Negative for seizures.  Endo/Heme/Allergies: Negative.   Psychiatric/Behavioral: Positive for depression and substance abuse. The patient is nervous/anxious.   all other systems negative   Blood pressure 135/71, pulse 120, temperature 97.9 F (36.6 C), temperature source Oral, resp. rate 18, height 6' (1.829 m), weight 280 lb (127.007 kg), SpO2 100 %.Body mass index is 37.97 kg/(m^2).  General Appearance: Fairly Groomed  Patent attorney::  Good  Speech:  Normal Rate  Volume:  Normal  Mood:  Anxious and mildly depressed   Affect:  constricted, vaguely anxious, but reactive   Thought Process:  Goal Directed and Linear  Orientation:  Full (Time, Place, and Person)  Thought Content:  no hallucinations, no delusions, not internally preoccupied '  Suicidal Thoughts:  No at this time denies any thoughts of hurting  self and  contracts for safety on unit   Homicidal Thoughts:  No  Memory:  recent and remote grossly intact   Judgement:  Other:  fair  Insight:  Present  Psychomotor Activity:  Normal- no tremors , no diaphoresis, no  psychomotor agitation noted  Concentration:  Good  Recall:  Good  Fund of Knowledge:Good  Language: Good  Akathisia:  Negative  Handed:  Right  AIMS (if indicated):     Assets:  Housing Physical Health Resilience  Sleep:  Number of Hours: 6.25  Cognition: WNL  ADL's:  fair     COGNITIVE FEATURES THAT CONTRIBUTE TO RISK:  Closed-mindedness    SUICIDE RISK:   Moderate:  Frequent suicidal ideation with limited intensity, and duration, some specificity in terms of plans, no associated intent, good self-control, limited dysphoria/symptomatology, some risk factors present, and identifiable protective factors, including available and accessible social support.  PLAN OF CARE: Patient will be admitted to inpatient psychiatric unit for stabilization and safety. Will provide and encourage milieu participation. Provide medication management and maked adjustments as needed.   Will also provide medication management to minimize risk of alcohol WDL. Will follow daily.    Medical Decision Making:  Review of Psycho-Social Stressors (1), Review or order clinical lab tests (1), Established Problem, Worsening (2) and Review of New Medication or Change in Dosage (2)  I certify that inpatient services furnished can reasonably be expected to improve the patient's condition.   Shacora Zynda 07/30/2014, 4:56 PM

## 2014-07-30 NOTE — Progress Notes (Signed)
D: Pt denies SI/HI/AVH. Pt is pleasant and cooperative. Pt denies any withdrawal sx and was observed interacting with his peers on the unit. He was playing cards and pt went [place].   A: Pt was offered support and encouragement. Pt was given scheduled medications. Pt was encourage to attend groups. Q 15 minute checks were done for safety.   R:Pt attends groups and interacts well with peers and staff. Pt is taking medication. Pt has no complaints at this time .Pt receptive to treatment and safety maintained on unit.

## 2014-07-30 NOTE — Tx Team (Signed)
Interdisciplinary Treatment Plan Update (Adult)  Date:  07/30/2014  Time Reviewed:  8:39 AM   Progress in Treatment: Attending groups: Patient is attending groups. Participating in groups:  Patient engages in discussion Taking medication as prescribed:  Patient is taking medications Tolerating medication:  Patient is tolerating medications Family/Significant othe contact made:   No, will asked for consent to make collateral contact Patient understands diagnosis:Yes, patient understands diagnosis and need for treatment Discussing patient identified problems/goals with staff:  Yes, patient is able to express goals/problems Medical problems stabilized or resolved:  Yes Denies suicidal/homicidal ideation: Yes, patient is denying SI/HI. Issues/concerns per patient self-inventory:   Other:  Discharge Plan or Barriers:  Patient is considering residential treatment  Reason for Continuation of Hospitalization: Anxiety Depression Medication stabilization   Comments:     Jeffery JaegerMark Hancock is a 24 y.o. male who voluntarily presents to [facility] with SI/Depression and anxiety. Pt reports the following: says his current emotional state is triggered by the image he has in head of finding his younger brother dead of a heroin overdose in 2015 and his mother's boyfriend's son overdose on heroin on 07/28/14, he survived. Pt no longer endorses SI, but states he had a plan upon arrival to the emerg dept to jump in front of a car or train. Pt reports substance abuse problems and is requesting help--he drinks 1-12pk of alcohol, daily, his last drink was 07/28/14. He smokes $100 worth of crack/cocaine, daily. His last use was 07/29/14. He admits occasional use of benzos, 2-3mg s at least 1x per month. He smokes marijuana if he with someone using the product, his last use was last July. Pt is visibly anxious and upset, he denies HI/AVH, no past inpt admissions and no current outpt services.    Additional  comments:  Patient and CSW reviewed Patient Discharge Process Letter/Patient Involvement Form.  Patient verbalized understanding and signed form.  Patient and CSW also reviewed and identified patient's goals and treatment plan.  Patient verbalized understanding and agreed to plan.  Estimated length of stay:   3-4 days  Review of initial/current patient goals per problem list:  Please see plan of careInterdisciplinary Treatment Plan Update (Adult)  Attendees: Patient 07/30/2014 8:39 AM   Family:   07/30/2014 8:39 AM   Physician:  Jeffery MassedFernando Cobos, MD 07/30/2014 8:39 AM   Nursing:   Robbie LouisVivian Kent, RN 07/30/2014 8:39 AM   Clinical Social Worker:  Juline PatchQuylle Cathie Bonnell, LCSW 07/30/2014 8:39 AM   Clinical Social Worker:  Samuella BruinKristin Drinkard, LCSW-A 07/30/2014 8:39 AM   Case Manager:  Onnie BoerJennifer Clark, RN 07/30/2014 8:39 AM   Other:  Joslyn Devonaroline Beaudry, RN 07/30/2014 8:39 AM  Other:   07/30/2014  8:39 AM   Other:  07/30/2014 8:39 AM   Other:  07/30/2014 8:39 AM   Other:  07/30/2014 8:39 AM   Other:  Jeffery CordialValerie Enoch, [facility] Team Coordinator 07/30/2014 8:39 AM   Other:   07/30/2014 8:39 AM   Other:  07/30/2014 8:39 AM   Other:   07/30/2014 8:39 AM    Scribe for Treatment Team:   Wynn BankerHodnett, Andy Moye Hairston, 07/30/2014   8:39 AM

## 2014-07-30 NOTE — H&P (Signed)
Psychiatric Admission Assessment Adult  Patient Identification: Jeffery Hancock MRN:  [id number] Date of Evaluation:  07/30/2014 Chief Complaint:  MDD Principal Diagnosis: <principal problem not specified> Diagnosis:   Patient Active Problem List   Diagnosis Date Noted  . Polysubstance abuse [F19.10] 07/30/2014  . Substance induced mood disorder [F19.94] 07/30/2014  . Major depression, single episode [F32.9] 07/29/2014   History of Present Illness:: Jeffery Hancock 24 y.o. male presents with worsening depression and anxiety.  Patient also presented with suicidal thoughts with a plan to lay on train tracks; cocaine and alcohol abuse.  Requesting alcohol detox. Patient states "I was extremely drunk and I was feeling like I didn't want to live anymore.  I don't look at death like other people do like it is horrible; the way I look at it, it doesn't really seam that bad." Patient states that his biggest stressor is his anxiety "The last thing that just screwed me up is my mom's boyfriend's son just died from a drug overdose; and my younger brother died this past 10-26-2022 and the 2023/03/28 before that my little cousin died. States that he drinks a 12 pk beer a day and when he doesn't drink he gets the shakes; last drink was yesterday "around 3 or 5 o'clock in the morning.  Patient also states that he uses crack cocaine "Use on a weekly basis; but how much depends on how much money I have."  Denies and legal trouble at this time or past related alcohol or drug use. Patient continues to endorse depression and anxiety; at this time he denies suicidal ideation, homicidal ideation, psychosis, psychosis. Patient denies history of psychiatric hospitalization in past; suicide attempt; or outpatient services.  Patient states that he has been prescribed Cymbalta by his primary care provider; has been taking it for two month and has been taking daily as prescribed and denies missing any doses.  States that he doesn't  feel that it is working 100% as it should.  Has also Lexapro "that just made me extremely angry when I was taking that." Patient states that he lives in [place], alone, no children.  States that his family is supportive.    Employed at [employer].  Elements:  Location:  Worsening depression. Quality:  Polysubstance abuse. Severity:  Sever. Duration:  Chronic. Associated Signs/Symptoms: Depression Symptoms:  depressed mood, insomnia, feelings of worthlessness/guilt, difficulty concentrating, hopelessness, recurrent thoughts of death, suicidal thoughts with specific plan, panic attacks, (Hypo) Manic Symptoms:  Distractibility, Anxiety Symptoms:  Panic Symptoms, Social Anxiety, Psychotic Symptoms:  Denies PTSD Symptoms: Denies Total Time spent with patient: 1 hour  Past Medical History:  Past Medical History  Diagnosis Date  . Panic attacks   . Anxiety   . Hyperlipemia   . Alcohol abuse   . Polysubstance abuse     crack, oxycodone, heroin, etoh  . Depression    History reviewed. No pertinent past surgical history. Family History:  Family History  Problem Relation Age of Onset  . Diabetes Other   . Hypertension Other   . Cancer Other   . Heart failure Other   . Alcohol abuse Father   . Depression Brother   . Bipolar disorder Brother    Social History:  History  Alcohol Use  . 18.0 oz/week  . 30 Cans of beer per week     History  Drug Use  . Yes  . Special: Cocaine, IV, Marijuana, Benzodiazepines    Comment: LSD, crack, oxycodone, heroin    History  Social History  . Marital Status: Single    Spouse Name: N/A  . Number of Children: N/A  . Years of Education: N/A   Social History Main Topics  . Smoking status: Current Every Day Smoker -- 1.00 packs/day    Types: Cigarettes  . Smokeless tobacco: Not on file  . Alcohol Use: 18.0 oz/week    30 Cans of beer per week  . Drug Use: Yes    Special: Cocaine, IV, Marijuana, Benzodiazepines     Comment:  LSD, crack, oxycodone, heroin  . Sexual Activity: Not on file   Other Topics Concern  . None   Social History Narrative   Additional Social History:    Pain Medications: denies Prescriptions: Cymbalta 51m Over the Counter: denies History of alcohol / drug use?: Yes Longest period of sobriety (when/how long): non Negative Consequences of Use: Financial, Personal relationships, Work / SYouth workerWithdrawal Symptoms:  (pt denies) Name of Substance 1: Alcohol  1 - Age of First Use: "heavily at 89 1 - Amount (size/oz): 12 pack of beer 1 - Frequency: daily 1 - Duration: on 1 - Last Use / Amount: 07/29/2014 Name of Substance 2: THC  2 - Age of First Use: Teens  2 - Amount (size/oz): Varies  2 - Frequency: Occasional  2 - Duration: On-going  2 - Last Use / Amount: 2015 Name of Substance 3: Crack/Cocaine  3 - Age of First Use: "3 years ago" 3 - Amount (size/oz): "depends on what money I have" 3 - Frequency: Daily  3 - Duration: On-going  3 - Last Use / Amount: 07/29/14 Name of Substance 4: Benzos-Xanax  (per chart review) 4 - Age of First Use: 20's  4 - Amount (size/oz): 2-353m  4 - Frequency: Montly  4 - Duration: On-going  4 - Last Use / Amount: Unk    Musculoskeletal: Strength & Muscle Tone: within normal limits Gait & Station: normal Patient leans: N/A  Psychiatric Specialty Exam: Physical Exam  Nursing note and vitals reviewed. Constitutional: He is oriented to person, place, and time.  Neck: Normal range of motion.  Respiratory: Effort normal.  Musculoskeletal: Normal range of motion.  Neurological: He is alert and oriented to person, place, and time.  Psychiatric: His speech is normal and behavior is normal. His mood appears anxious. He exhibits a depressed mood. He expresses suicidal (but denies at this time) ideation. He expresses suicidal plans.    Review of Systems  Psychiatric/Behavioral: Positive for depression (Rates 5/10) and substance abuse. Negative for  memory loss. Suicidal ideas: States pror to admission was having suicidal thoughts "Lay down on train tracks" Hallucinations: Denies. The patient is nervous/anxious (Rates 8-9/10) and has insomnia ("I can't fall asleep unless I drink").   All other systems reviewed and are negative.   Blood pressure 135/71, pulse 120, temperature 97.9 F (36.6 C), temperature source Oral, resp. rate 18, height 6' (1.829 m), weight 127.007 kg (280 lb), SpO2 100 %.Body mass index is 37.97 kg/(m^2).  General Appearance: Casual  Eye Contact::  Good  Speech:  Clear and Coherent and Normal Rate  Volume:  Normal  Mood:  Anxious and Depressed  Affect:  Depressed  Thought Process:  Linear  Orientation:  Full (Time, Place, and Person)  Thought Content:  Denies hallucinations, delusions, and paranoia  Suicidal Thoughts:  Yes.  with intent/plan But denies at this time  Homicidal Thoughts:  No  Memory:  Immediate;   Good Recent;   Good Remote;   Good  Judgement:  Poor  Insight:  Lacking  Psychomotor Activity:  Restlessness  Concentration:  Fair  Recall:  Good  Fund of Knowledge:Good  Language: Good  Akathisia:  No  Handed:  Right  AIMS (if indicated):     Assets:  Communication Skills Desire for Improvement Housing Physical Health Social Support  ADL's:  Intact  Cognition: WNL  Sleep:  Number of Hours: 6.25   Risk to Self: Is patient at risk for suicide?: Yes What has been your use of drugs/alcohol within the last 12 months?: Patient reports drinking a half pint of liquor and a 12 pack of beer daily for years. He also reports abusing as much crack as he can afford on weekends. Risk to Others:   Prior Inpatient Therapy:   Prior Outpatient Therapy:    Alcohol Screening: Patient refused Alcohol Screening Tool: Yes 1. How often do you have a drink containing alcohol?: 4 or more times a week 2. How many drinks containing alcohol do you have on a typical day when you are drinking?: 10 or more 3. How often  do you have six or more drinks on one occasion?: Daily or almost daily Preliminary Score: 8 4. How often during the last year have you found that you were not able to stop drinking once you had started?: Weekly 5. How often during the last year have you failed to do what was normally expected from you becasue of drinking?: Monthly 6. How often during the last year have you needed a first drink in the morning to get yourself going after a heavy drinking session?: Never 7. How often during the last year have you had a feeling of guilt of remorse after drinking?: Weekly 8. How often during the last year have you been unable to remember what happened the night before because you had been drinking?: Monthly 9. Have you or someone else been injured as a result of your drinking?: No 10. Has a relative or friend or a doctor or another health worker been concerned about your drinking or suggested you cut down?: Yes, during the last year Alcohol Use Disorder Identification Test Final Score (AUDIT): 26 Brief Intervention: Patient declined brief intervention  Allergies:  No Known Allergies Lab Results:  Results for orders placed or performed during the hospital encounter of 07/29/14 (from the past 48 hour(s))  Urinalysis, Routine w reflex microscopic (not at [facility])     Status: Abnormal   Collection Time: 07/29/14  4:00 AM  Result Value Ref Range   Color, Urine YELLOW YELLOW   APPearance CLEAR CLEAR   Specific Gravity, Urine <1.005 (L) 1.005 - 1.030   pH 6.5 5.0 - 8.0   Glucose, UA NEGATIVE NEGATIVE mg/dL   Hgb urine dipstick TRACE (A) NEGATIVE   Bilirubin Urine NEGATIVE NEGATIVE   Ketones, ur NEGATIVE NEGATIVE mg/dL   Protein, ur NEGATIVE NEGATIVE mg/dL   Urobilinogen, UA 0.2 0.0 - 1.0 mg/dL   Nitrite NEGATIVE NEGATIVE   Leukocytes, UA NEGATIVE NEGATIVE  Urine rapid drug screen (hosp performed)not at [facility]     Status: Abnormal   Collection Time: 07/29/14  4:00 AM  Result Value Ref Range   Opiates  NONE DETECTED NONE DETECTED   Cocaine POSITIVE (A) NONE DETECTED   Benzodiazepines NONE DETECTED NONE DETECTED   Amphetamines NONE DETECTED NONE DETECTED   Tetrahydrocannabinol NONE DETECTED NONE DETECTED   Barbiturates NONE DETECTED NONE DETECTED    Comment:        DRUG SCREEN FOR MEDICAL PURPOSES  ONLY.  IF CONFIRMATION IS NEEDED FOR ANY PURPOSE, NOTIFY LAB WITHIN 5 DAYS.        LOWEST DETECTABLE LIMITS FOR URINE DRUG SCREEN Drug Class       Cutoff (ng/mL) Amphetamine      1000 Barbiturate      200 Benzodiazepine   122 Tricyclics       482 Opiates          300 Cocaine          300 THC              50   Urine microscopic-add on     Status: None   Collection Time: 07/29/14  4:00 AM  Result Value Ref Range   Squamous Epithelial / LPF RARE RARE   WBC, UA 0-2 <3 WBC/hpf   RBC / HPF 0-2 <3 RBC/hpf   Bacteria, UA RARE RARE  CBC with Differential/Platelet     Status: None   Collection Time: 07/29/14  4:10 AM  Result Value Ref Range   WBC 7.9 4.0 - 10.5 K/uL   RBC 5.34 4.22 - 5.81 MIL/uL   Hemoglobin 17.0 13.0 - 17.0 g/dL   HCT 47.9 39.0 - 52.0 %   MCV 89.7 78.0 - 100.0 fL   MCH 31.8 26.0 - 34.0 pg   MCHC 35.5 30.0 - 36.0 g/dL   RDW 12.9 11.5 - 15.5 %   Platelets 221 150 - 400 K/uL   Neutrophils Relative % 65 43 - 77 %   Neutro Abs 5.1 1.7 - 7.7 K/uL   Lymphocytes Relative 27 12 - 46 %   Lymphs Abs 2.1 0.7 - 4.0 K/uL   Monocytes Relative 7 3 - 12 %   Monocytes Absolute 0.6 0.1 - 1.0 K/uL   Eosinophils Relative 1 0 - 5 %   Eosinophils Absolute 0.1 0.0 - 0.7 K/uL   Basophils Relative 0 0 - 1 %   Basophils Absolute 0.0 0.0 - 0.1 K/uL  Comprehensive metabolic panel     Status: Abnormal   Collection Time: 07/29/14  4:10 AM  Result Value Ref Range   Sodium 138 135 - 145 mmol/L   Potassium 3.7 3.5 - 5.1 mmol/L   Chloride 104 101 - 111 mmol/L   CO2 21 (L) 22 - 32 mmol/L   Glucose, Bld 125 (H) 65 - 99 mg/dL   BUN 14 6 - 20 mg/dL   Creatinine, Ser 0.97 0.61 - 1.24 mg/dL    Calcium 9.3 8.9 - 10.3 mg/dL   Total Protein 8.1 6.5 - 8.1 g/dL   Albumin 4.6 3.5 - 5.0 g/dL   AST 52 (H) 15 - 41 U/L   ALT 74 (H) 17 - 63 U/L   Alkaline Phosphatase 92 38 - 126 U/L   Total Bilirubin 0.4 0.3 - 1.2 mg/dL   GFR calc non Af Amer >60 >60 mL/min   GFR calc Af Amer >60 >60 mL/min    Comment: (NOTE) The eGFR has been calculated using the CKD EPI equation. This calculation has not been validated in all clinical situations. eGFR's persistently <60 mL/min signify possible Chronic Kidney Disease.    Anion gap 13 5 - 15  Ethanol     Status: Abnormal   Collection Time: 07/29/14  4:10 AM  Result Value Ref Range   Alcohol, Ethyl (B) 188 (H) <5 mg/dL    Comment:        LOWEST DETECTABLE LIMIT FOR SERUM ALCOHOL IS 11 mg/dL FOR MEDICAL PURPOSES  ONLY   Salicylate level     Status: None   Collection Time: 07/29/14  4:10 AM  Result Value Ref Range   Salicylate Lvl <6.5 2.8 - 30.0 mg/dL  Acetaminophen level     Status: Abnormal   Collection Time: 07/29/14  4:10 AM  Result Value Ref Range   Acetaminophen (Tylenol), Serum <10 (L) 10 - 30 ug/mL    Comment:        THERAPEUTIC CONCENTRATIONS VARY SIGNIFICANTLY. A RANGE OF 10-30 ug/mL MAY BE AN EFFECTIVE CONCENTRATION FOR MANY PATIENTS. HOWEVER, SOME ARE BEST TREATED AT CONCENTRATIONS OUTSIDE THIS RANGE. ACETAMINOPHEN CONCENTRATIONS >150 ug/mL AT 4 HOURS AFTER INGESTION AND >50 ug/mL AT 12 HOURS AFTER INGESTION ARE OFTEN ASSOCIATED WITH TOXIC REACTIONS.    Current Medications: Current Facility-Administered Medications  Medication Dose Route Frequency Provider Last Rate Last Dose  . acetaminophen (TYLENOL) tablet 650 mg  650 mg Oral Q6H PRN Niel Hummer, NP      . alum & mag hydroxide-simeth (MAALOX/MYLANTA) 200-200-20 MG/5ML suspension 30 mL  30 mL Oral Q4H PRN Niel Hummer, NP      . DULoxetine (CYMBALTA) DR capsule 30 mg  30 mg Oral Daily Shuvon B Rankin, NP      . hydrOXYzine (ATARAX/VISTARIL) tablet 25 mg  25 mg Oral  Q6H PRN Niel Hummer, NP      . loperamide (IMODIUM) capsule 2-4 mg  2-4 mg Oral PRN Niel Hummer, NP      . LORazepam (ATIVAN) tablet 1 mg  1 mg Oral Q6H PRN Niel Hummer, NP      . LORazepam (ATIVAN) tablet 1 mg  1 mg Oral TID Niel Hummer, NP       Followed by  . [START ON 07/31/2014] LORazepam (ATIVAN) tablet 1 mg  1 mg Oral BID Niel Hummer, NP       Followed by  . [START ON 08/02/2014] LORazepam (ATIVAN) tablet 1 mg  1 mg Oral Daily Niel Hummer, NP      . magnesium hydroxide (MILK OF MAGNESIA) suspension 30 mL  30 mL Oral Daily PRN Niel Hummer, NP      . multivitamin with minerals tablet 1 tablet  1 tablet Oral Daily Niel Hummer, NP   1 tablet at 07/30/14 0905  . nicotine (NICODERM CQ - dosed in mg/24 hours) patch 21 mg  21 mg Transdermal Daily Nicholaus Bloom, MD   21 mg at 07/30/14 0905  . ondansetron (ZOFRAN-ODT) disintegrating tablet 4 mg  4 mg Oral Q6H PRN Niel Hummer, NP      . thiamine (VITAMIN B-1) tablet 100 mg  100 mg Oral Daily Niel Hummer, NP   100 mg at 07/30/14 0904  . traZODone (DESYREL) tablet 50 mg  50 mg Oral QHS PRN Niel Hummer, NP   50 mg at 07/29/14 2246   PTA Medications: Prescriptions prior to admission  Medication Sig Dispense Refill Last Dose  . DULoxetine (CYMBALTA) 30 MG capsule Take 30 mg by mouth daily.   07/28/2014 at Unknown time  . [DISCONTINUED] ALPRAZolam (XANAX) 0.5 MG tablet Take 1 tablet (0.5 mg total) by mouth 2 (two) times daily as needed for anxiety. (Patient not taking: Reported on 06/24/2014) 10 tablet 0   . [DISCONTINUED] doxycycline (VIBRA-TABS) 100 MG tablet Take 1 tablet (100 mg total) by mouth 2 (two) times daily. (Patient not taking: Reported on 06/24/2014) 20 tablet 0   . [DISCONTINUED] HYDROcodone-acetaminophen (NORCO/VICODIN)  5-325 MG per tablet Take 1 tablet by mouth every 4 (four) hours as needed for moderate pain. (Patient not taking: Reported on 06/24/2014) 15 tablet 0   . [DISCONTINUED] hydrOXYzine (ATARAX/VISTARIL) 25 MG  tablet Take one every 6 hours for anxiety 30 tablet 0     Previous Psychotropic Medications: Yes   Substance Abuse History in the last 12 months:  Yes.      Consequences of Substance Abuse: Withdrawal Symptoms:   Tremors  Results for orders placed or performed during the hospital encounter of 07/29/14 (from the past 72 hour(s))  Urinalysis, Routine w reflex microscopic (not at [facility])     Status: Abnormal   Collection Time: 07/29/14  4:00 AM  Result Value Ref Range   Color, Urine YELLOW YELLOW   APPearance CLEAR CLEAR   Specific Gravity, Urine <1.005 (L) 1.005 - 1.030   pH 6.5 5.0 - 8.0   Glucose, UA NEGATIVE NEGATIVE mg/dL   Hgb urine dipstick TRACE (A) NEGATIVE   Bilirubin Urine NEGATIVE NEGATIVE   Ketones, ur NEGATIVE NEGATIVE mg/dL   Protein, ur NEGATIVE NEGATIVE mg/dL   Urobilinogen, UA 0.2 0.0 - 1.0 mg/dL   Nitrite NEGATIVE NEGATIVE   Leukocytes, UA NEGATIVE NEGATIVE  Urine rapid drug screen (hosp performed)not at [facility]     Status: Abnormal   Collection Time: 07/29/14  4:00 AM  Result Value Ref Range   Opiates NONE DETECTED NONE DETECTED   Cocaine POSITIVE (A) NONE DETECTED   Benzodiazepines NONE DETECTED NONE DETECTED   Amphetamines NONE DETECTED NONE DETECTED   Tetrahydrocannabinol NONE DETECTED NONE DETECTED   Barbiturates NONE DETECTED NONE DETECTED    Comment:        DRUG SCREEN FOR MEDICAL PURPOSES ONLY.  IF CONFIRMATION IS NEEDED FOR ANY PURPOSE, NOTIFY LAB WITHIN 5 DAYS.        LOWEST DETECTABLE LIMITS FOR URINE DRUG SCREEN Drug Class       Cutoff (ng/mL) Amphetamine      1000 Barbiturate      200 Benzodiazepine   462 Tricyclics       703 Opiates          300 Cocaine          300 THC              50   Urine microscopic-add on     Status: None   Collection Time: 07/29/14  4:00 AM  Result Value Ref Range   Squamous Epithelial / LPF RARE RARE   WBC, UA 0-2 <3 WBC/hpf   RBC / HPF 0-2 <3 RBC/hpf   Bacteria, UA RARE RARE  CBC with  Differential/Platelet     Status: None   Collection Time: 07/29/14  4:10 AM  Result Value Ref Range   WBC 7.9 4.0 - 10.5 K/uL   RBC 5.34 4.22 - 5.81 MIL/uL   Hemoglobin 17.0 13.0 - 17.0 g/dL   HCT 47.9 39.0 - 52.0 %   MCV 89.7 78.0 - 100.0 fL   MCH 31.8 26.0 - 34.0 pg   MCHC 35.5 30.0 - 36.0 g/dL   RDW 12.9 11.5 - 15.5 %   Platelets 221 150 - 400 K/uL   Neutrophils Relative % 65 43 - 77 %   Neutro Abs 5.1 1.7 - 7.7 K/uL   Lymphocytes Relative 27 12 - 46 %   Lymphs Abs 2.1 0.7 - 4.0 K/uL   Monocytes Relative 7 3 - 12 %   Monocytes Absolute 0.6 0.1 - 1.0  K/uL   Eosinophils Relative 1 0 - 5 %   Eosinophils Absolute 0.1 0.0 - 0.7 K/uL   Basophils Relative 0 0 - 1 %   Basophils Absolute 0.0 0.0 - 0.1 K/uL  Comprehensive metabolic panel     Status: Abnormal   Collection Time: 07/29/14  4:10 AM  Result Value Ref Range   Sodium 138 135 - 145 mmol/L   Potassium 3.7 3.5 - 5.1 mmol/L   Chloride 104 101 - 111 mmol/L   CO2 21 (L) 22 - 32 mmol/L   Glucose, Bld 125 (H) 65 - 99 mg/dL   BUN 14 6 - 20 mg/dL   Creatinine, Ser 0.97 0.61 - 1.24 mg/dL   Calcium 9.3 8.9 - 10.3 mg/dL   Total Protein 8.1 6.5 - 8.1 g/dL   Albumin 4.6 3.5 - 5.0 g/dL   AST 52 (H) 15 - 41 U/L   ALT 74 (H) 17 - 63 U/L   Alkaline Phosphatase 92 38 - 126 U/L   Total Bilirubin 0.4 0.3 - 1.2 mg/dL   GFR calc non Af Amer >60 >60 mL/min   GFR calc Af Amer >60 >60 mL/min    Comment: (NOTE) The eGFR has been calculated using the CKD EPI equation. This calculation has not been validated in all clinical situations. eGFR's persistently <60 mL/min signify possible Chronic Kidney Disease.    Anion gap 13 5 - 15  Ethanol     Status: Abnormal   Collection Time: 07/29/14  4:10 AM  Result Value Ref Range   Alcohol, Ethyl (B) 188 (H) <5 mg/dL    Comment:        LOWEST DETECTABLE LIMIT FOR SERUM ALCOHOL IS 11 mg/dL FOR MEDICAL PURPOSES ONLY   Salicylate level     Status: None   Collection Time: 07/29/14  4:10 AM  Result  Value Ref Range   Salicylate Lvl <1.6 2.8 - 30.0 mg/dL  Acetaminophen level     Status: Abnormal   Collection Time: 07/29/14  4:10 AM  Result Value Ref Range   Acetaminophen (Tylenol), Serum <10 (L) 10 - 30 ug/mL    Comment:        THERAPEUTIC CONCENTRATIONS VARY SIGNIFICANTLY. A RANGE OF 10-30 ug/mL MAY BE AN EFFECTIVE CONCENTRATION FOR MANY PATIENTS. HOWEVER, SOME ARE BEST TREATED AT CONCENTRATIONS OUTSIDE THIS RANGE. ACETAMINOPHEN CONCENTRATIONS >150 ug/mL AT 4 HOURS AFTER INGESTION AND >50 ug/mL AT 12 HOURS AFTER INGESTION ARE OFTEN ASSOCIATED WITH TOXIC REACTIONS.     Observation Level/Precautions:  15 minute checks  Laboratory:  CBC Chemistry Profile UDS UA  Psychotherapy:  Individual and group sessions  Medications:  Will start medication add/adjust as appropriate for patients stabilization  Consultations:  Psychiatry  Discharge Concerns:  Safety, stabilization, and risk of access to medication and medication stabilization   Estimated LOS:  5-7 days  Other:     Psychological Evaluations: Yes   Treatment Plan Summary: Daily contact with patient to assess and evaluate symptoms and progress in treatment and Medication management   1. Admit for crisis management and stabilization 2. Medication management to reduce current symptoms to bale line and improve the patient's overall level of functioning:  Started Ativan detox protocol and restarted Cymbalta 30 mg daily for depression 3. Treat health problems as indicated 4. Develop treatment plan to decrease risk of relapse upon discharge and the need for readmission. 5. Psycho-social education regarding relapse prevention and self care. 6. Health care follow up as needed for medical problems 7. Restart  home medications where appropriate.    Medical Decision Making:  Review of Psycho-Social Stressors (1), Review or order clinical lab tests (1), Review and summation of old records (2), Review of Last Therapy Session (1),  Independent Review of image, tracing or specimen (2) and Review of Medication Regimen & Side Effects (2)  I certify that inpatient services furnished can reasonably be expected to improve the patient's condition.   Rankin, Shuvon, FNP-BC 5/27/20162:59 PM   Case discussed with NP and patient seen by me Agree with NP note, assessment 24 year old man, who reports that in the context of alcohol and cocaine intoxication developed increased depression and suicidal ideations. He was thinking of walking into traffic. He went to a Engineer, structural and requested help, so was brought to hospital. He states he has a long history of substance abuse, and describes a history of abusing alcohol , cocaine, and in the past heroin, ecstasy, cannabis, but no recent use of opiates .  He describes a long history of Anxiety and more specifically of Panic Attacks, which he feels perpetuate/contribute to his ongoing alcohol abuse. He was started on Cymbalta a few weeks ago, and so far has not had any side effects.  At this time he is doing better , mood is improved, and denies any current suicidal ideations. Currently on alcohol withdrawal protocol, and interested in going to an inpatient rehab upon discharge . Dx- Polysubstance Dependence , Alcohol withdrawal, Substance Induced Mood Disorder , depressed  Plan- continue Alcohol Detox Protocol, continue Cymbalta at 60 mgrs QDAY . Patient interested in getting tested for HIV, Viral Hepatitis .

## 2014-07-30 NOTE — [facility] Group Notes (Signed)
[facility]   07/30/2014 9:54 AM    Participation Quality:  Appropraite  Mood/Affect:  Appropriate  Depression Rating:  3  Anxiety Rating:  7  Thoughts of Suicide:  No  Will you contract for safety?   NA  Current AVH:  No  Plan for Discharge/Comments:  Patient attended discharge planning group and actively participated in group. He advised of needing help with alcohol/crack cocaine abuse.  He is interested in residential treatment.  Suicide prevention education reviewed and SPE document provided.   Transportation Means: Patient has transportation.   Supports:  Patient has a support system.   Tritia Endo, Joesph JulyQuylle Hairston

## 2014-07-30 NOTE — Clinical Social Work Note (Signed)
CSW spoke with mother who advised patient was the person who found brother after he committed suicide by ODing.  Mother advised patient and others were supposed to be with patient's brother and using the same drugs but did not get there in time.  She shared patient has never talked about what happened.

## 2014-07-30 NOTE — [facility] Suicide Risk Assessment (Signed)
[facility] INPATIENT:  Family/Significant Other Suicide Prevention Education  Suicide Prevention Education:  Education Completed; Lauris ChromanJodi Weyknecht, Mother, [phone];  has been identified by the patient as the family member/significant other with whom the patient will be residing, and identified as the person(s) who will aid the patient in the event of a mental health crisis (suicidal ideations/suicide attempt).  With written consent from the patient, the family member/significant other has been provided the following suicide prevention education, prior to the and/or following the discharge of the patient.  Mother advised that unfortunately she is too familiar with Suicide.  The suicide prevention education provided includes the following:  Suicide risk factors  Suicide prevention and interventions  National Suicide Hotline telephone number  [facility] assessment telephone number  [facility] 911  [facility] and/or Residential Mobile Crisis Unit telephone number  Request made of family/significant other to:  Remove weapons (e.g., guns, rifles, knives), all items previously/currently identified as safety concern.  Mother advised patient does not have access to weapons.    Remove drugs/medications (over-the-counter, prescriptions, illicit drugs), all items previously/currently identified as a safety concern.  The family member/significant other verbalizes understanding of the suicide prevention education information provided.  The family member/significant other agrees to remove the items of safety concern listed above.  Wynn BankerHodnett, Dontravious Camille Hairston 07/30/2014, 4:15 PM

## 2014-07-30 NOTE — Clinical Social Work Note (Signed)
Per conversation with patient, referral made to [facility] for residential treatment.  Clinical faxed.  Awaiting call regarding acceptance.

## 2014-07-31 ENCOUNTER — Encounter (HOSPITAL_COMMUNITY): Payer: Self-pay | Admitting: Registered Nurse

## 2014-07-31 LAB — HEPATITIS C ANTIBODY: HCV Ab: NEGATIVE

## 2014-07-31 LAB — HIV ANTIBODY (ROUTINE TESTING W REFLEX): HIV SCREEN 4TH GENERATION: NONREACTIVE

## 2014-07-31 LAB — HEPATITIS B SURFACE ANTIGEN: HEP B S AG: NEGATIVE

## 2014-07-31 NOTE — [facility] Group Notes (Signed)
[facility] Group Notes:  (Nursing/MHT/Case Management/Adjunct)  Date:  07/31/2014  Time:  11:27 AM  Type of Therapy:  Psychoeducational Skills  Participation Level:  Active  Participation Quality:  Appropriate  Affect:  Appropriate  Cognitive:  Appropriate  Insight:  Appropriate  Engagement in Group:  Engaged  Modes of Intervention:  Discussion  Summary of Progress/Problems: Pt did attend self inventory group, pt reported that he was negative SI/HI, no AH/VH noted. Pt rated his depression as a 0, his anxiety as a 3, and his helplessness/hopelessness as a 5.     Pt reported no issues or concerns.  Jacquelyne BalintForrest, Joneisha Miles Shanta 07/31/2014, 11:27 AM

## 2014-07-31 NOTE — Progress Notes (Signed)
[facility] Group Notes:  (Nursing/MHT/Case Management/Adjunct)  Date:  07/31/2014  Time:  11:37 PM  Type of Therapy:  Psychoeducational Skills  Participation Level:  Active  Participation Quality:  Appropriate  Affect:  Appropriate  Cognitive:  Appropriate  Insight:  Appropriate  Engagement in Group:  Engaged  Modes of Intervention:  Discussion  Summary of Progress/Problems: Tonight in wrap up group Jeffery Hancock said that he had a good day and has not set any goals for him to work on yet. He was a bit quiet but listening to every one else talk about their day. Jeffery SavageDiamond N Oleva Hancock 07/31/2014, 11:37 PM

## 2014-07-31 NOTE — Progress Notes (Signed)
Patient ID: Jeffery JaegerMark Erhardt, male   DOB: 04/09/1990, 24 y.o.   MRN: [id number] D: Patient alert and cooperative. Pt mood and affect is appropriate to situation. Pt report medication regime has been effective. Pt denies any alcohol withdrawal symptoms. Pt denies SI/HI/AVH and pain.  A: Medications administered as prescribed. Supported and encouragement provided.  R: Patient remains safe and complaint with medications.

## 2014-07-31 NOTE — Progress Notes (Signed)
D: Patient denies SI/HI and A/V hallucinations; patient denies pain; patient denies all withdrawal  A: Monitored q 15 minutes; patient encouraged to attend groups; patient educated about medications; patient given medications per physician orders; patient encouraged to express feelings and/or concerns  R: Patient is cooperative and animated; patient tremors have decreased; patient is pleasant

## 2014-07-31 NOTE — Plan of Care (Signed)
Problem: Alteration in mood; excessive anxiety as evidenced by: Goal: STG-Pt will report an absence of self-harm thoughts/actions (Patient will report an absence of self-harm thoughts or actions)  Outcome: Progressing Pt is safe and denies suicidal ideation

## 2014-07-31 NOTE — [facility] Group Notes (Signed)
[facility] Group Notes:  (Clinical Social Work)  07/31/2014     1:15-2:15PM  Summary of Progress/Problems:   The main focus of today's process group was to discuss barriers to using healthy coping skills.   The patient expressed that their unhealthy coping involves drugs while healthy coping includes working in the garden or on [employer].  He did not participate much but remained alert.  Type of Therapy:  Group Therapy - Process   Participation Level:  Active  Participation Quality:  Attentive  Affect:  Blunted  Cognitive:  Alert  Insight:  Improving  Engagement in Therapy:  Developing/Improving  Modes of Intervention:  Education, Motivational Interviewing  Ambrose MantleMareida Grossman-Orr, LCSW 07/31/2014, 3:48 PM

## 2014-07-31 NOTE — Progress Notes (Signed)
[facility]  07/31/2014 1:03 PM Jeffery Hancock  MRN:  [id number]   Subjective:  "My anxiety is still the same but everything else seems fine."  Patient appears to be calmer today than yesterday.  Tolerating medications without adverse effect; and attending /participating in group sessions  Principal Problem: Substance induced mood disorder Diagnosis:   Patient Active Problem List   Diagnosis Date Noted  . Polysubstance abuse [F19.10] 07/30/2014  . Substance induced mood disorder [F19.94] 07/30/2014  . Major depression, single episode [F32.9] 07/29/2014   Total Time spent with patient: Greater than 30 minutes   Past Medical History:  Past Medical History  Diagnosis Date  . Panic attacks   . Anxiety   . Hyperlipemia   . Alcohol abuse   . Polysubstance abuse     crack, oxycodone, heroin, etoh  . Depression    History reviewed. No pertinent past surgical history. Family History:  Family History  Problem Relation Age of Onset  . Diabetes Other   . Hypertension Other   . Cancer Other   . Heart failure Other   . Alcohol abuse Father   . Depression Brother   . Bipolar disorder Brother    Social History:  History  Alcohol Use  . 18.0 oz/week  . 30 Cans of beer per week     History  Drug Use  . Yes  . Special: Cocaine, IV, Marijuana, Benzodiazepines    Comment: LSD, crack, oxycodone, heroin    History   Social History  . Marital Status: Single    Spouse Name: N/A  . Number of Children: N/A  . Years of Education: N/A   Social History Main Topics  . Smoking status: Current Every Day Smoker -- 1.00 packs/day    Types: Cigarettes  . Smokeless tobacco: Not on file  . Alcohol Use: 18.0 oz/week    30 Cans of beer per week  . Drug Use: Yes    Special: Cocaine, IV, Marijuana, Benzodiazepines     Comment: LSD, crack, oxycodone, heroin  . Sexual Activity: Not on file   Other Topics Concern  . None   Social History Narrative   Additional History:     Sleep: Fair, Improving with medication  Appetite:  Good   Assessment:   Musculoskeletal: Strength & Muscle Tone: within normal limits Gait & Station: normal Patient leans: N/A   Psychiatric Specialty Exam: Physical Exam  Nursing note and vitals reviewed. Constitutional: He is oriented to person, place, and time.  Neck: Normal range of motion.  Respiratory: Effort normal.  Musculoskeletal: Normal range of motion.  Neurological: He is alert and oriented to person, place, and time.  Psychiatric: His speech is normal. His mood appears anxious. He exhibits a depressed mood. He expresses suicidal ideation.    Review of Systems  Psychiatric/Behavioral: Positive for depression, suicidal ideas and substance abuse. The patient is nervous/anxious and has insomnia.   All other systems reviewed and are negative.   Blood pressure 126/58, pulse 80, temperature 97.9 F (36.6 C), temperature source Oral, resp. rate 16, height 6' (1.829 m), weight 127.007 kg (280 lb), SpO2 99 %.Body mass index is 37.97 kg/(m^2).  General Appearance: Casual  Eye Contact::  Good  Speech:  Clear and Coherent and Normal Rate  Volume:  Normal  Mood:  Anxious and Depressed  Affect:  Depressed  Thought Process:  Circumstantial  Orientation:  Full (Time, Place, and Person)  Thought Content:  Rumination  Suicidal Thoughts:  Yes.  with  intent/plan  Continues to deny at this time  Homicidal Thoughts:  No  Memory:  Immediate;   Good Recent;   Good Remote;   Good  Judgement:  Poor  Insight:  Fair and Lacking  Psychomotor Activity:  Normal  Concentration:  Fair  Recall:  Good  Fund of Knowledge:Good  Language: Good  Akathisia:  No  Handed:  Right  AIMS (if indicated):     Assets:  Communication Skills Desire for Improvement Physical Health Social Support  ADL's:  Intact  Cognition: WNL  Sleep:  Number of Hours: 6.5     Current Medications: Current Facility-Administered Medications  Medication  Dose Route Frequency Provider Last Rate Last Dose  . acetaminophen (TYLENOL) tablet 650 mg  650 mg Oral Q6H PRN Thermon Leyland, NP      . alum & mag hydroxide-simeth (MAALOX/MYLANTA) 200-200-20 MG/5ML suspension 30 mL  30 mL Oral Q4H PRN Thermon Leyland, NP      . DULoxetine (CYMBALTA) DR capsule 60 mg  60 mg Oral Daily Craige Cotta, MD   60 mg at 07/31/14 8119  . hydrOXYzine (ATARAX/VISTARIL) tablet 25 mg  25 mg Oral Q6H PRN Thermon Leyland, NP      . loperamide (IMODIUM) capsule 2-4 mg  2-4 mg Oral PRN Thermon Leyland, NP      . LORazepam (ATIVAN) tablet 1 mg  1 mg Oral Q6H PRN Thermon Leyland, NP      . LORazepam (ATIVAN) tablet 1 mg  1 mg Oral BID Thermon Leyland, NP       Followed by  . [START ON 08/02/2014] LORazepam (ATIVAN) tablet 1 mg  1 mg Oral Daily Thermon Leyland, NP      . magnesium hydroxide (MILK OF MAGNESIA) suspension 30 mL  30 mL Oral Daily PRN Thermon Leyland, NP      . multivitamin with minerals tablet 1 tablet  1 tablet Oral Daily Thermon Leyland, NP   1 tablet at 07/31/14 [id number]  . nicotine (NICODERM CQ - dosed in mg/24 hours) patch 21 mg  21 mg Transdermal Daily Rachael Fee, MD   21 mg at 07/31/14 2956  . ondansetron (ZOFRAN-ODT) disintegrating tablet 4 mg  4 mg Oral Q6H PRN Thermon Leyland, NP      . thiamine (VITAMIN B-1) tablet 100 mg  100 mg Oral Daily Thermon Leyland, NP   100 mg at 07/31/14 2130  . traZODone (DESYREL) tablet 100 mg  100 mg Oral QHS PRN Craige Cotta, MD   100 mg at 07/30/14 2121    Lab Results: No results found for this or any previous visit (from the past 48 hour(s)).  Physical Findings: AIMS: Facial and Oral Movements Muscles of Facial Expression: None, normal Lips and Perioral Area: None, normal Jaw: None, normal Tongue: None, normal,Extremity Movements Upper (arms, wrists, hands, fingers): None, normal Lower (legs, knees, ankles, toes): None, normal, Trunk Movements Neck, shoulders, hips: None, normal, Overall Severity Severity of abnormal movements  (highest score from questions above): None, normal Incapacitation due to abnormal movements: None, normal Patient's awareness of abnormal movements (rate only patient's report): No Awareness, Dental Status Current problems with teeth and/or dentures?: No Does patient usually wear dentures?: No  CIWA:  CIWA-Ar Total: 0 COWS:  COWS Total Score: 7  Treatment Plan Summary: Daily contact with patient to assess and evaluate symptoms and progress in treatment and Medication management  1. Admit for crisis management and stabilization  2. Medication management to reduce current symptoms to bale line and improve the patient's overall level of functioning: Started Ativan detox protocol and restarted Cymbalta 30 mg daily for depression 3. Treat health problems as indicated 4. Develop treatment plan to decrease risk of relapse upon discharge and the need for readmission. 5. Psycho-social education regarding relapse prevention and self care. 6. Health care follow up as needed for medical problems 7. Restart home medications where appropriate.  Will Continue with current treatment plan and no changes at this time  Medical Decision Making:  Review of Psycho-Social Stressors (1), Review of Last Therapy Session (1) and Review of Medication Regimen & Side Effects (2)   Rankin, Shuvon, FNP-BC 07/31/2014, 1:03 PM I agree with assessment and plan Madie Reno A. Dub Mikes, M.D.

## 2014-08-01 MED ORDER — HYDROXYZINE HCL 50 MG PO TABS
50.0000 mg | ORAL_TABLET | Freq: Once | ORAL | Status: AC
  Administered 2014-08-01: 50 mg via ORAL
  Filled 2014-08-01 (×2): qty 1

## 2014-08-01 NOTE — Plan of Care (Signed)
Problem: Alteration in mood Goal: LTG-Patient reports reduction in suicidal thoughts (Patient reports reduction in suicidal thoughts and is able to verbalize a safety plan for whenever patient is feeling suicidal)  Outcome: Progressing Pt denies SI at this time  Problem: Alteration in mood & ability to function due to Goal: LTG-Patient demonstrates decreased signs of withdrawal Goal not met. Patient has a CIWA of five, BP at 129/77. CIWA will be at zero prior to discharge.  Joette Catching, LCSW Clinical Social Worker [phone]  07/30/2014 12:14 PM    (Patient demonstrates decreased signs of withdrawal to the point the patient is safe to return home and continue treatment in an outpatient setting)  Outcome: Progressing Pt has endorses no signs of withdrawal at this time Goal: STG: Patient verbalizes decreases in signs of withdrawal Outcome: Progressing Pt endorses no signs of withdrawal at this time

## 2014-08-01 NOTE — Progress Notes (Signed)
D: Pt denies SI/HI/AVH. Pt is pleasant and cooperative. Pt stated his anxiety got out of control today. Pt wants help with controlling anxiety.   A: Pt was offered support and encouragement. Pt was given scheduled medications. Pt was encourage to attend groups. Q 15 minute checks were done for safety. Pt given breathing exercises, distraction suggestions when anxiety escalates.   R:Pt attends groups and interacts well with peers and staff. Pt is taking medication. Pt has no complaints at this time .Pt receptive to treatment and safety maintained on unit. Pt receptive to non-pharmacological anti-anxiety measures.

## 2014-08-01 NOTE — Progress Notes (Signed)
D: Pt denies SI/HI/AV. Pt is pleasant and cooperative. Pt stated his anxiety was getting the best of him today, pt ready to go to rehab facility.    A: Pt was offered support and encouragement. Pt was given scheduled medications. Pt was encourage to attend groups. Q 15 minute checks were done for safety.  R:Pt attends groups and interacts well with peers and staff. Pt is taking medication. Pt has no complaints.Pt receptive to treatment and safety maintained on unit.

## 2014-08-01 NOTE — Progress Notes (Signed)
[facility] MD Progress Note  08/01/2014 9:46 AM Ziyan Schoon  MRN:  [id number]   Subjective:  Patient states "I'm still anxious"  Patient states that he continues to be anxious "I don't know why.  I can't figure it out."  Objective:  Patient rates his anxiety 9/10 and depression 5/10.  States that he is tolerating his medication without adverse effects; He is attending and participate in group sessions.  Principal Problem: Substance induced mood disorder Diagnosis:   Patient Active Problem List   Diagnosis Date Noted  . Polysubstance abuse [F19.10] 07/30/2014  . Substance induced mood disorder [F19.94] 07/30/2014  . Major depression, single episode [F32.9] 07/29/2014   Total Time spent with patient: 20 minutes   Past Medical History:  Past Medical History  Diagnosis Date  . Panic attacks   . Anxiety   . Hyperlipemia   . Alcohol abuse   . Polysubstance abuse     crack, oxycodone, heroin, etoh  . Depression    History reviewed. No pertinent past surgical history. Family History:  Family History  Problem Relation Age of Onset  . Diabetes Other   . Hypertension Other   . Cancer Other   . Heart failure Other   . Alcohol abuse Father   . Depression Brother   . Bipolar disorder Brother    Social History:  History  Alcohol Use  . 18.0 oz/week  . 30 Cans of beer per week     History  Drug Use  . Yes  . Special: Cocaine, IV, Marijuana, Benzodiazepines    Comment: LSD, crack, oxycodone, heroin    History   Social History  . Marital Status: Single    Spouse Name: N/A  . Number of Children: N/A  . Years of Education: N/A   Social History Main Topics  . Smoking status: Current Every Day Smoker -- 1.00 packs/day    Types: Cigarettes  . Smokeless tobacco: Not on file  . Alcohol Use: 18.0 oz/week    30 Cans of beer per week  . Drug Use: Yes    Special: Cocaine, IV, Marijuana, Benzodiazepines     Comment: LSD, crack, oxycodone, heroin  . Sexual Activity: Not on file    Other Topics Concern  . None   Social History Narrative   Additional History:    Sleep: Fair, Improving with medication  Appetite:  Good   Assessment:   Musculoskeletal: Strength & Muscle Tone: within normal limits Gait & Station: normal Patient leans: N/A   Psychiatric Specialty Exam: Physical Exam  Nursing note and vitals reviewed. Constitutional: He is oriented to person, place, and time.  Neck: Normal range of motion.  Respiratory: Effort normal.  Musculoskeletal: Normal range of motion.  Neurological: He is alert and oriented to person, place, and time.  Psychiatric: His speech is normal. His mood appears anxious. He exhibits a depressed mood. He expresses suicidal ideation.    Review of Systems  Psychiatric/Behavioral: Positive for depression, suicidal ideas and substance abuse. The patient is nervous/anxious and has insomnia.   All other systems reviewed and are negative.   Blood pressure 120/74, pulse 103, temperature 97.8 F (36.6 C), temperature source Oral, resp. rate 18, height 6' (1.829 m), weight 127.007 kg (280 lb), SpO2 99 %.Body mass index is 37.97 kg/(m^2).  General Appearance: Casual  Eye Contact::  Good  Speech:  Clear and Coherent and Normal Rate  Volume:  Normal  Mood:  Anxious and Depressed  Affect:  Depressed  Thought Process:  Circumstantial  Orientation:  Full (Time, Place, and Person)  Thought Content:  Denies hallucinations, delusions, and paranoia at this time  Suicidal Thoughts:  Yes.  with intent/plan  Continues to deny at this time  Homicidal Thoughts:  No  Memory:  Immediate;   Good Recent;   Good Remote;   Good  Judgement:  Poor  Insight:  Fair  Psychomotor Activity:  Normal  Concentration:  Fair  Recall:  Good  Fund of Knowledge:Good  Language: Good  Akathisia:  No  Handed:  Right  AIMS (if indicated):     Assets:  Communication Skills Desire for Improvement Physical Health Social Support  ADL's:  Intact   Cognition: WNL  Sleep:  Number of Hours: 6.5     Current Medications: Current Facility-Administered Medications  Medication Dose Route Frequency Provider Last Rate Last Dose  . acetaminophen (TYLENOL) tablet 650 mg  650 mg Oral Q6H PRN Thermon LeylandLaura A Davis, NP      . alum & mag hydroxide-simeth (MAALOX/MYLANTA) 200-200-20 MG/5ML suspension 30 mL  30 mL Oral Q4H PRN Thermon LeylandLaura A Davis, NP      . DULoxetine (CYMBALTA) DR capsule 60 mg  60 mg Oral Daily Craige CottaFernando A Cobos, MD   60 mg at 08/01/14 [id number]  . hydrOXYzine (ATARAX/VISTARIL) tablet 25 mg  25 mg Oral Q6H PRN Thermon LeylandLaura A Davis, NP   25 mg at 07/31/14 2218  . loperamide (IMODIUM) capsule 2-4 mg  2-4 mg Oral PRN Thermon LeylandLaura A Davis, NP      . LORazepam (ATIVAN) tablet 1 mg  1 mg Oral Q6H PRN Thermon LeylandLaura A Davis, NP   1 mg at 07/31/14 2218  . [START ON 08/02/2014] LORazepam (ATIVAN) tablet 1 mg  1 mg Oral Daily Thermon LeylandLaura A Davis, NP      . magnesium hydroxide (MILK OF MAGNESIA) suspension 30 mL  30 mL Oral Daily PRN Thermon LeylandLaura A Davis, NP      . multivitamin with minerals tablet 1 tablet  1 tablet Oral Daily Thermon LeylandLaura A Davis, NP   1 tablet at 08/01/14 [id number]  . nicotine (NICODERM CQ - dosed in mg/24 hours) patch 21 mg  21 mg Transdermal Daily Rachael FeeIrving A Berdell Nevitt, MD   21 mg at 08/01/14 [id number]  . ondansetron (ZOFRAN-ODT) disintegrating tablet 4 mg  4 mg Oral Q6H PRN Thermon LeylandLaura A Davis, NP      . thiamine (VITAMIN B-1) tablet 100 mg  100 mg Oral Daily Thermon LeylandLaura A Davis, NP   100 mg at 08/01/14 [id number]  . traZODone (DESYREL) tablet 100 mg  100 mg Oral QHS PRN Craige CottaFernando A Cobos, MD   100 mg at 07/31/14 2218    Lab Results:  Results for orders placed or performed during the hospital encounter of 07/29/14 (from the past 48 hour(s))  Hepatitis C antibody     Status: None   Collection Time: 07/31/14  6:30 AM  Result Value Ref Range   HCV Ab NEGATIVE NEGATIVE    Comment: Performed at [facility]  Hepatitis B surface antigen     Status: None   Collection Time: 07/31/14  6:30 AM  Result Value Ref  Range   Hepatitis B Surface Ag NEGATIVE NEGATIVE    Comment: Performed at [facility]  HIV antibody     Status: None   Collection Time: 07/31/14  6:30 AM  Result Value Ref Range   HIV Screen 4th Generation wRfx Non Reactive Non Reactive    Comment: (NOTE) Performed At: [facility] [address]  [address] [place], [place] [id number] Mila Homer MD HQ:[id number] Performed at [facility]     Physical Findings: AIMS: Facial and Oral Movements Muscles of Facial Expression: None, normal Lips and Perioral Area: None, normal Jaw: None, normal Tongue: None, normal,Extremity Movements Upper (arms, wrists, hands, fingers): None, normal Lower (legs, knees, ankles, toes): None, normal, Trunk Movements Neck, shoulders, hips: None, normal, Overall Severity Severity of abnormal movements (highest score from questions above): None, normal Incapacitation due to abnormal movements: None, normal Patient's awareness of abnormal movements (rate only patient's report): No Awareness, Dental Status Current problems with teeth and/or dentures?: No Does patient usually wear dentures?: No  CIWA:  CIWA-Ar Total: 0 COWS:  COWS Total Score: 7  Treatment Plan Summary: Daily contact with patient to assess and evaluate symptoms and progress in treatment and Medication management  1. Admit for crisis management and stabilization 2. Medication management to reduce current symptoms to bale line and improve the patient's overall level of functioning: 07/31/2014 Started Ativan detox protocol and restarted Cymbalta 30 mg daily for depression 3. Treat health problems as indicated 4. Develop treatment plan to decrease risk of relapse upon discharge and the need for readmission. 5. Psycho-social education regarding relapse prevention and self care. 6. Health care follow up as needed for medical problems 7. Restart home medications where appropriate.  Continue current treatment plan  no changes at this time  Medical Decision Making:  Review of Psycho-Social Stressors (1), Review of Last Therapy Session (1) and Review of Medication Regimen & Side Effects (2)   Rankin, Shuvon, FNP-BC 08/01/2014, 9:46 AM I agree with assessment and plan Madie Reno A. Dub Mikes, M.D.

## 2014-08-01 NOTE — [facility] Group Notes (Signed)
[facility] Group Notes:  (Nursing/MHT/Case Management/Adjunct)  Date:  08/01/2014  Time:  11:46 AM  Type of Therapy:  Psychoeducational Skills  Participation Level:  Active  Participation Quality:  Appropriate  Affect:  Appropriate  Cognitive:  Appropriate  Insight:  Appropriate  Engagement in Group:  Engaged  Modes of Intervention:  Discussion  Summary of Progress/Problems: Pt did attend self inventory group, pt reported that he was negative SI/HI, no AH/VH noted. Pt rated his depression as a 1, and his helplessness/hopelessness as a 6.     Pt reported no issues or concerns.   Jacquelyne BalintForrest, Stellan Vick Shanta 08/01/2014, 11:46 AM

## 2014-08-01 NOTE — [facility] Group Notes (Signed)
[facility] Group Notes:  (Clinical Social Work)  08/01/2014  1:15-2:15PM  Summary of Progress/Problems:   The main focus of today's process group was to   1)  discuss the importance of adding supports  2)  define health supports versus unhealthy supports  3)  identify the patient's current unhealthy supports and plan how to handle them  4)  Identify the patient's current healthy supports and plan what to add.  An emphasis was placed on using counselor, doctor, therapy groups, 12-step groups, and problem-specific support groups to expand supports.    The patient expressed full comprehension of the concepts presented, and agreed that there is a need to add more supports.  The patient stated he does not know what he is going to do after rehab, is only taking one step at a time.  Type of Therapy:  Process Group with Motivational Interviewing  Participation Level:  Active  Participation Quality:  Attentive  Affect:  Blunted  Cognitive:  Alert  Insight:  Limited  Engagement in Therapy:  Limited  Modes of Intervention:   Education, Support and Processing, Activity  Ambrose MantleMareida Grossman-Orr, LCSW 08/01/2014

## 2014-08-01 NOTE — Progress Notes (Signed)
Patient ID: Jeffery Hancock, male   DOB: 01/20/1991, 24 y.o.   MRN: [id number]   D: Pt has been very flat and depressed on the unit today. Pt has also reported lots of anxiety and required as needed medication. Pt reported that his anxiety was a 0, his hopelessness was a 2, and his anxiety was a 8. Pt reported that he just wanted to make it through the day. Pt reported being negative SI/HI, no AH/VH noted. A: 15 min checks continued for patient safety. R: Pt safety maintained.

## 2014-08-02 DIAGNOSIS — F10230 Alcohol dependence with withdrawal, uncomplicated: Secondary | ICD-10-CM

## 2014-08-02 DIAGNOSIS — F332 Major depressive disorder, recurrent severe without psychotic features: Principal | ICD-10-CM

## 2014-08-02 DIAGNOSIS — F10229 Alcohol dependence with intoxication, unspecified: Secondary | ICD-10-CM | POA: Diagnosis present

## 2014-08-02 DIAGNOSIS — F1020 Alcohol dependence, uncomplicated: Secondary | ICD-10-CM | POA: Diagnosis present

## 2014-08-02 DIAGNOSIS — F10980 Alcohol use, unspecified with alcohol-induced anxiety disorder: Secondary | ICD-10-CM

## 2014-08-02 DIAGNOSIS — F10280 Alcohol dependence with alcohol-induced anxiety disorder: Secondary | ICD-10-CM

## 2014-08-02 DIAGNOSIS — F10239 Alcohol dependence with withdrawal, unspecified: Secondary | ICD-10-CM | POA: Diagnosis present

## 2014-08-02 DIAGNOSIS — F10939 Alcohol use, unspecified with withdrawal, unspecified: Secondary | ICD-10-CM | POA: Diagnosis present

## 2014-08-02 DIAGNOSIS — F1420 Cocaine dependence, uncomplicated: Secondary | ICD-10-CM | POA: Diagnosis present

## 2014-08-02 LAB — TSH: TSH: 1.955 u[IU]/mL (ref 0.350–4.500)

## 2014-08-02 MED ORDER — HYDROXYZINE HCL 50 MG PO TABS
50.0000 mg | ORAL_TABLET | Freq: Every evening | ORAL | Status: DC | PRN
  Administered 2014-08-02: 50 mg via ORAL
  Filled 2014-08-02: qty 1
  Filled 2014-08-02: qty 4

## 2014-08-02 MED ORDER — DULOXETINE HCL 60 MG PO CPEP
90.0000 mg | ORAL_CAPSULE | Freq: Every day | ORAL | Status: DC
  Administered 2014-08-03: 90 mg via ORAL
  Filled 2014-08-02 (×3): qty 1

## 2014-08-02 MED ORDER — GABAPENTIN 100 MG PO CAPS
100.0000 mg | ORAL_CAPSULE | Freq: Three times a day (TID) | ORAL | Status: DC
  Administered 2014-08-02 – 2014-08-03 (×3): 100 mg via ORAL
  Filled 2014-08-02 (×7): qty 1

## 2014-08-02 NOTE — [facility] Group Notes (Signed)
   [facility]  08/02/2014  8:45 AM   Participation Quality: Alert, Appropriate and Oriented  Mood/Affect: Appropriate  Depression Rating: 0  Anxiety Rating: 6  Thoughts of Suicide: Pt denies SI/HI  Will you contract for safety? Yes  Current AVH: Pt denies  Plan for Discharge/Comments: Pt attended discharge planning group and actively participated in group. CSW provided pt with today's workbook. Patient reports feeling "fine" today. [facility].  Transportation Means: Pt reports access to transportation  Supports: No supports mentioned at this time  Samuella BruinKristin Ivry Pigue, MSW, [employer]CSWA Clinical Social Worker [facility] [phone]

## 2014-08-02 NOTE — Progress Notes (Signed)
Pt attended wrap-up and when asked what his goal was for the day stated he did not have a goal.  Pt stated that he had a good day and feels as though he has definitely improved since his admission.  States that his thinking has improved and is feeling a lot better.  States that he will be d/c'd on Tuesday and the plan is [facility].   Tomi BambergerMariya Marylee Belzer, MHT

## 2014-08-02 NOTE — Progress Notes (Signed)
[facility] MD Progress Note  08/02/2014 1:56 PM Jeffery Hancock  MRN:  [id number]   Subjective:  Patient states "I'm still anxious, my anxiety goes through the roof."   Objective:  Patient seen and chart reviewed.Discussed patient with treatment team.  Patient is a 39 y old CM who presented with depression , anxiety as well as SI . Pt continues to be anxious , reports Cymbalta is working for his depression. Pt denies SI today. Pt denies any withdrawal sx today , CIWA/VS reviewed. Pt denies any ADRs of medications. Reports sleep as improved, appetite as improved. Per staff - no disruptive issues noted on the unit.  Discussed adding neurontin as well as increasing the dose of Cymbalta. Pt agrees with plan.   Principal Problem: MDD (major depressive disorder), recurrent severe, without psychosis Diagnosis:   Patient Active Problem List   Diagnosis Date Noted  . MDD (major depressive disorder), recurrent severe, without psychosis [F33.2] 08/02/2014  . Alcohol use disorder, severe, dependence [F10.20] 08/02/2014  . Alcohol withdrawal [F10.239] 08/02/2014  . 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 Cocaine use disorder, moderate, dependence [F14.20] 08/02/2014   Total Time spent with patient: 30 minutes   Past Medical History:  Past Medical History  Diagnosis Date  . Panic attacks   . Anxiety   . Hyperlipemia   . Alcohol abuse   . Polysubstance abuse     crack, oxycodone, heroin, etoh  . Depression    History reviewed. No pertinent past surgical history. Family History:  Family History  Problem Relation Age of Onset  . Diabetes Other   . Hypertension Other   . Cancer Other   . Heart failure Other   . Alcohol abuse Father   . Depression Brother   . Bipolar disorder Brother    Social History:  History  Alcohol Use  . 18.0 oz/week  . 30 Cans of beer per week     History  Drug Use  . Yes   . Special: Cocaine, IV, Marijuana, Benzodiazepines    Comment: LSD, crack, oxycodone, heroin    History   Social History  . Marital Status: Single    Spouse Name: N/A  . Number of Children: N/A  . Years of Education: N/A   Social History Main Topics  . Smoking status: Current Every Day Smoker -- 1.00 packs/day    Types: Cigarettes  . Smokeless tobacco: Not on file  . Alcohol Use: 18.0 oz/week    30 Cans of beer per week  . Drug Use: Yes    Special: Cocaine, IV, Marijuana, Benzodiazepines     Comment: LSD, crack, oxycodone, heroin  . Sexual Activity: Not on file   Other Topics Concern  . None   Social History Narrative   Additional History:    Sleep: Fair, Improving with medication  Appetite:  Good     Musculoskeletal: Strength & Muscle Tone: within normal limits Gait & Station: normal Patient leans: N/A   Psychiatric Specialty Exam: Physical Exam  Nursing note and vitals reviewed. Constitutional: He is oriented to person, place, and time.  Neck: Normal range of motion.  Respiratory: Effort normal.  Musculoskeletal: Normal range of motion.  Neurological: He is alert and oriented to person, place, and time.  Psychiatric: His speech is normal. His mood appears anxious. He exhibits a depressed mood. He expresses suicidal ideation.    Review of Systems  Psychiatric/Behavioral: Positive for depression and substance abuse. The  patient is nervous/anxious.   All other systems reviewed and are negative.   Blood pressure 115/73, pulse 103, temperature 97.5 F (36.4 C), temperature source Oral, resp. rate 17, height 6' (1.829 m), weight 127.007 kg (280 lb), SpO2 99 %.Body mass index is 37.97 kg/(m^2).  General Appearance: Casual  Eye Contact::  Good  Speech:  Normal Rate  Volume:  Normal  Mood:  Anxious and Depressed depression is improving, continues to have anxiety   Affect:  Congruent  Thought Process:  Goal Directed  Orientation:  Full (Time, Place, and  Person)  Thought Content:  Denies hallucinations, delusions, and paranoia at this time  Suicidal Thoughts:  No    Homicidal Thoughts:  No  Memory:  Immediate;   Good Recent;   Good Remote;   Good  Judgement:  Fair  Insight:  Shallow  Psychomotor Activity:  Normal  Concentration:  Fair  Recall:  Good  Fund of Knowledge:Good  Language: Good  Akathisia:  No  Handed:  Right  AIMS (if indicated):     Assets:  Communication Skills Desire for Improvement Physical Health Social Support  ADL's:  Intact  Cognition: WNL  Sleep:  Number of Hours: 6.5     Current Medications: Current Facility-Administered Medications  Medication Dose Route Frequency Provider Last Rate Last Dose  . acetaminophen (TYLENOL) tablet 650 mg  650 mg Oral Q6H PRN Thermon Leyland, NP      . alum & mag hydroxide-simeth (MAALOX/MYLANTA) 200-200-20 MG/5ML suspension 30 mL  30 mL Oral Q4H PRN Thermon Leyland, NP      . Melene Muller ON 08/03/2014] DULoxetine (CYMBALTA) DR capsule 90 mg  90 mg Oral Daily Siris Hoos, MD      . gabapentin (NEURONTIN) capsule 100 mg  100 mg Oral TID Jomarie Longs, MD   100 mg at 08/02/14 1205  . magnesium hydroxide (MILK OF MAGNESIA) suspension 30 mL  30 mL Oral Daily PRN Thermon Leyland, NP      . multivitamin with minerals tablet 1 tablet  1 tablet Oral Daily Thermon Leyland, NP   1 tablet at 08/02/14 [phone]  . nicotine (NICODERM CQ - dosed in mg/24 hours) patch 21 mg  21 mg Transdermal Daily Rachael Fee, MD   21 mg at 08/02/14 0934  . thiamine (VITAMIN B-1) tablet 100 mg  100 mg Oral Daily Thermon Leyland, NP   100 mg at 08/02/14 0934  . traZODone (DESYREL) tablet 100 mg  100 mg Oral QHS PRN Craige Cotta, MD   100 mg at 08/01/14 2249    Lab Results:  No results found for this or any previous visit (from the past 48 hour(s)).  Physical Findings: AIMS: Facial and Oral Movements Muscles of Facial Expression: None, normal Lips and Perioral Area: None, normal Jaw: None, normal Tongue: None,  normal,Extremity Movements Upper (arms, wrists, hands, fingers): None, normal Lower (legs, knees, ankles, toes): None, normal, Trunk Movements Neck, shoulders, hips: None, normal, Overall Severity Severity of abnormal movements (highest score from questions above): None, normal Incapacitation due to abnormal movements: None, normal Patient's awareness of abnormal movements (rate only patient's report): No Awareness, Dental Status Current problems with teeth and/or dentures?: No Does patient usually wear dentures?: No  CIWA:  CIWA-Ar Total: 0 COWS:  COWS Total Score: 7  Assessment: Patient is a 4 y old CM who presented with worsening depression , anxiety, SI. Pt currently on CIWA/Ativan protocol, with some improvement of his depressive sx. Continues to  struggle with anxiety issues. Will continue treatment.   Treatment Plan Summary: Daily contact with patient to assess and evaluate symptoms and progress in treatment and Medication management   Will increase Cymbalta to 90 mg po daily for affective sx. Will add Gabapentin 100 mg po tid for anxiety sx. Could titrate higher tomorrow. Will continue CIWA/Ativan protocol. Will continue Trazadone 100 mg po qhs prn for sleep. Place order for TSH tonight. Review tomorrow. Will continue to encourage patient to attend groups. CSW will work on disposition.        Medical Decision Making:  Review of Psycho-Social Stressors (1), Review of Last Therapy Session (1) and Review of Medication Regimen & Side Effects (2)   Sania Noy, md 08/02/2014, 1:56 PM

## 2014-08-02 NOTE — Progress Notes (Signed)
Patient ID: Jeffery JaegerMark Hancock, male   DOB: 06/04/1990, 24 y.o.   MRN: [id number]   D: Pt has been very flat and depressed on the unit today. Pt reported that he is getting better, but was still having lots of anxiety. Pt reported that his anxiety was a 6, when it has been a 10. Pt reported that his depression was a 0, and his hopelessness was a 2. Pt reported that his goal for today was to get ready for discharge. Pt reported being negative SI/HI, no AH/VH noted. A: 15 min checks continued for patient safety. R: Pt safety maintained.

## 2014-08-03 MED ORDER — TRAZODONE HCL 100 MG PO TABS: 100.0000 mg | ORAL_TABLET | Freq: Every evening | ORAL | Status: DC | PRN

## 2014-08-03 MED ORDER — GABAPENTIN 100 MG PO CAPS
200.0000 mg | ORAL_CAPSULE | Freq: Two times a day (BID) | ORAL | Status: DC
  Filled 2014-08-03: qty 16
  Filled 2014-08-03: qty 2
  Filled 2014-08-03: qty 16
  Filled 2014-08-03: qty 2

## 2014-08-03 MED ORDER — NICOTINE 21 MG/24HR TD PT24: 21.0000 mg | MEDICATED_PATCH | Freq: Every day | TRANSDERMAL | Status: DC

## 2014-08-03 MED ORDER — DULOXETINE HCL 30 MG PO CPEP: 90.0000 mg | ORAL_CAPSULE | Freq: Every day | ORAL | Status: DC

## 2014-08-03 MED ORDER — DULOXETINE HCL 30 MG PO CPEP
90.0000 mg | ORAL_CAPSULE | Freq: Every day | ORAL | Status: DC
  Filled 2014-08-03 (×2): qty 12

## 2014-08-03 MED ORDER — GABAPENTIN 100 MG PO CAPS: 200.0000 mg | ORAL_CAPSULE | Freq: Two times a day (BID) | ORAL | Status: DC

## 2014-08-03 MED ORDER — HYDROXYZINE HCL 50 MG PO TABS: 50.0000 mg | ORAL_TABLET | Freq: Every evening | ORAL | Status: DC | PRN

## 2014-08-03 NOTE — Progress Notes (Signed)
D. Per patient self inventory form pt reports he slept fair last night, with the help of sleep medications. He reports his energy level is normal and he has a good appetite.  He rates his depression 1/10, hopelessness 4/10 and anxiety 6/10;all on 1-10 scale, 10 being the worse.  "That is just my normal anxiety." He denies SI/HI; denies A/V/H at this time. He reports that he is happy to be going home today, restless on approach. He denies physical pain.   A. Medication administered per MD order. Encouragement and counseling provided. Remains on special checks q 15 mins for safety.   R: Compliant with medication regimen. Safety maintained.

## 2014-08-03 NOTE — Tx Team (Signed)
Interdisciplinary Treatment Plan Update (Adult)  Date:  08/03/2014  Time Reviewed:  9:53 AM   Progress in Treatment: Attending groups: Patient is attending groups. Participating in groups:  Patient engages in discussion Taking medication as prescribed:  Patient is taking medications Tolerating medication:  Patient is tolerating medications Family/Significant othe contact made:   Yes, collateral contact with mother. Patient understands diagnosis:Yes, patient understands diagnosis and need for treatment Discussing patient identified problems/goals with staff:  Yes, patient is able to express goals/problems Medical problems stabilized or resolved:  Yes Denies suicidal/homicidal ideation: Yes, patient is denying SI/HI. Issues/concerns per patient self-inventory:   Other:  Discharge Plan or Barriers:  [facility]  Reason for Continuation of Hospitalization:  Comments:       Additional comments:  Patient and CSW reviewed Patient Discharge Process Letter/Patient Involvement Form.  Patient verbalized understanding and signed form.  Patient and CSW also reviewed and identified patient's goals and treatment plan.  Patient verbalized understanding and agreed to plan.  Estimated length of stay:   Discharge today  Review of initial/current patient goals per problem list:  Please see plan of careInterdisciplinary Treatment Plan Update (Adult)  Attendees: Patient 08/03/2014 9:53 AM   Family:   08/03/2014 9:53 AM   Physician:  Nehemiah MassedFernando Cobos, MD 08/03/2014 9:53 AM   Nursing:   Clide DalesAshley Strater, RN 08/03/2014 9:53 AM   Clinical Social Worker:  Juline PatchQuylle Azaiah Mello, LCSW 08/03/2014 9:53 AM   Clinical Social Worker:  Samuella BruinKristin Drinkard, LCSW-A 08/03/2014 9:53 AM   Case Manager:  Onnie BoerJennifer Clark, RN 08/03/2014 9:53 AM   Other:  Quintella ReichertBeverly Knight, RN 08/03/2014 9:53 AM  Other:   08/03/2014  9:53 AM   Other:  08/03/2014 9:53 AM   Other:  08/03/2014 9:53 AM   Other:  08/03/2014 9:53 AM   Other:  Chad CordialValerie Enoch,  [facility] Transition Team Coordinator 08/03/2014 9:53 AM   Other:   08/03/2014 9:53 AM   Other:  08/03/2014 9:53 AM   Other:   08/03/2014 9:53 AM    Scribe for Treatment Team:   Wynn BankerHodnett, Blima Jaimes Hairston, 08/03/2014   9:53 AM

## 2014-08-03 NOTE — [facility] Suicide Risk Assessment (Signed)
[facility]   Demographic Factors:  24 year old man, single , no children, lives alone .   Total Time spent with patient: 30 minutes  Musculoskeletal: Strength & Muscle Tone: within normal limits Gait & Station: normal Patient leans: N/A  Psychiatric Specialty Exam: Physical Exam  ROS  Blood pressure 115/73, pulse 103, temperature 97.5 F (36.4 C), temperature source Oral, resp. rate 17, height 6' (1.829 m), weight 280 lb (127.007 kg), SpO2 99 %.Body mass index is 37.97 kg/(m^2).  General Appearance: improved grooming   Eye Contact::  Good  Speech:  Normal Rate409  Volume:  Normal  Mood:  reports " good " mood today, denies depression  Affect:  improved, reactive, states he " always feels a little anxious "   Thought Process:  Linear  Orientation:  Full (Time, Place, and Person)  Thought Content:  denies hallucinations, no delusions, not internally preoccupied   Suicidal Thoughts:  No  Homicidal Thoughts:  No  Memory:  recent and remote grossly intact  Judgement:  Other:  improved   Insight:  improved  Psychomotor Activity:  Normal  Concentration:  Good  Recall:  Good  Fund of Knowledge:Good  Language: Good  Akathisia:  No  Handed:  Right  AIMS (if indicated):     Assets:  Communication Skills Desire for Improvement Resilience  Sleep:  Number of Hours: 5.75  Cognition: WNL  ADL's:  improved   Have you used any form of tobacco in the last 30 days? (Cigarettes, Smokeless Tobacco, Cigars, and/or Pipes): Yes  Has this patient used any form of tobacco in the last 30 days? (Cigarettes, Smokeless Tobacco, Cigars, and/or Pipes)  We reviewed benefits of smoking cessation, and patient wants nicotine patches prescribed at discharge.   Mental Status Per Nursing Assessment::   On Admission:     Current Mental Status by Physician: At this time patient is improved , and is denying depression or any lingering symptoms of withdrawal. Denies depression  at this time, and affect is reactive . He is not suicidal or homicidal and is not psychotic. He is future oriented and invested in going to Rehab program after discharge. [facility]  Loss Factors: Brother passed away from overdose several months ago.   Historical Factors: History of alcohol dependence , cocaine abuse, depression, anxiety, this was first psychiatric admission  Risk Reduction Factors:   Sense of responsibility to family and Positive coping skills or problem solving skills  Continued Clinical Symptoms:  At this  Time patient much improved compared to admission- mood improved and currently denies depression, states anxiety is a " constant " for him , even at baseline, but is improved currently. No SI or HI, no psychotic symptoms , future oriented . Of note, we reviewed labs, to include Viral Hepatitis and HIV results prior to his discharge.  We also reviewed his current medication management and side effect profile .   Cognitive Features That Contribute To Risk:  No gross cognitive deficits noted upon discharge. Is alert , attentive, and oriented x 3   Suicide Risk:  Mild:  Suicidal ideation of limited frequency, intensity, duration, and specificity.  There are no identifiable plans, no associated intent, mild dysphoria and related symptoms, good self-control (both objective and subjective assessment), few other risk factors, and identifiable protective factors, including available and accessible social support.  Principal Problem: MDD (major depressive disorder), recurrent severe, without psychosis Discharge Diagnoses:  Patient Active Problem List  Diagnosis Date Noted  . MDD (major depressive disorder), recurrent severe, without psychosis [F33.2] 08/02/2014  . Alcohol use disorder, severe, dependence [F10.20] 08/02/2014  . Alcohol withdrawal [F10.239] 08/02/2014  . 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 Cocaine use disorder, moderate, dependence [F14.20] 08/02/2014    Follow-up Information    Follow up with [facility] On 08/03/2014.   Why:  You have been accepted to [facility] on Tuesday, Aug 03, 2014 upon arrival     [place] information:   [address] [place], [place]  [address] [phone]      Plan Of Care/Follow-up recommendations:  Activity:  as tolerated Diet:  Regular Tests:  NA Other:  See below   Is patient on multiple antipsychotic therapies at discharge:  No  Do you recommend tapering to monotherapy for antipsychotics?  No   Has Patient had three or more failed trials of antipsychotic monotherapy by history:  No  Recommended Plan for Multiple Antipsychotic Therapies: NA   Patient is leaving in good spirits. Mother will transport to [facility] [place].     COBOS, FERNANDO 08/03/2014, 10:22 AM

## 2014-08-03 NOTE — Progress Notes (Signed)
Pt discharged per md order. Pt given RX and sample medication. Discharge information given and reviewed with pt. He expresses understanding. He denies SI/HI and A/V/H at discharge. Reports he is being discharged [facility]. Mother at facility lobby to transport pt to rehab center. Pt verbalizes he has no [facility], also signed that he has no belongings. Ambulatory out of facility.

## 2014-08-03 NOTE — Discharge Summary (Signed)
Physician Discharge Summary Note  Patient:  Jeffery Hancock is an 24 y.o., male MRN:  [id number] DOB:  05/31/1990 Patient phone:  [phone] (home)  Patient address:   [address] [place] [id number],  Total Time spent with patient: Greater than 30 minutes  Date of Admission:  07/29/2014  Date of Discharge: 08/03/14  Reason for Admission:    Principal Problem: MDD (major depressive disorder), recurrent severe, without psychosis Discharge Diagnoses: Patient Active Problem List   Diagnosis Date Noted  . MDD (major depressive disorder), recurrent severe, without psychosis [F33.2] 08/02/2014  . Alcohol use disorder, severe, dependence [F10.20] 08/02/2014  . Alcohol withdrawal [F10.239] 08/02/2014  . 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 Cocaine use disorder, moderate, dependence [F14.20] 08/02/2014   Musculoskeletal: Strength & Muscle Tone: within normal limits Gait & Station: normal Patient leans: N/A  Psychiatric Specialty Exam: Physical Exam  Psychiatric: His speech is normal and behavior is normal. Judgment and thought content normal. His mood appears not anxious. His affect is not angry, not blunt, not labile and not inappropriate. Cognition and memory are normal. He does not exhibit a depressed mood.    Review of Systems  Constitutional: Negative.   HENT: Negative.   Eyes: Negative.   Respiratory: Negative.   Cardiovascular: Negative.   Gastrointestinal: Negative.   Genitourinary: Negative.   Musculoskeletal: Negative.   Skin: Negative.   Neurological: Negative.   Endo/Heme/Allergies: Negative.   Psychiatric/Behavioral: Positive for depression (Stable) and substance abuse (Alcohol & cocaine use disorder). Negative for suicidal ideas, hallucinations and memory loss. The patient has insomnia (Stable). The patient is not nervous/anxious.     Blood pressure 115/73, pulse 103,  temperature 97.5 F (36.4 C), temperature source Oral, resp. rate 17, height 6' (1.829 m), weight 127.007 kg (280 lb), SpO2 99 %.Body mass index is 37.97 kg/(m^2).  See Md's SRA  Have you used any form of tobacco in the last 30 days? (Cigarettes, Smokeless Tobacco, Cigars, and/or Pipes): Yes   Has this patient used any form of tobacco in the last 30 days? (Cigarettes, Smokeless Tobacco, Cigars, and/or Pipes): Yes, we reviewed benefits of smoking cessation, and patient wants nicotine patches prescribed at discharge.   Past Medical History:  Past Medical History  Diagnosis Date  . Panic attacks   . Anxiety   . Hyperlipemia   . Alcohol abuse   . Polysubstance abuse     crack, oxycodone, heroin, etoh  . Depression    History reviewed. No pertinent past surgical history.  Family History:  Family History  Problem Relation Age of Onset  . Diabetes Other   . Hypertension Other   . Cancer Other   . Heart failure Other   . Alcohol abuse Father   . Depression Brother   . Bipolar disorder Brother    Social History:  History  Alcohol Use  . 18.0 oz/week  . 30 Cans of beer per week     History  Drug Use  . Yes  . Special: Cocaine, IV, Marijuana, Benzodiazepines    Comment: LSD, crack, oxycodone, heroin    History   Social History  . Marital Status: Single    Spouse Name: N/A  . Number of Children: N/A  . Years of Education: N/A   Social History Main Topics  . Smoking status: Current Every Day Smoker -- 1.00 packs/day    Types: Cigarettes  . Smokeless tobacco: Not on file  . Alcohol  Use: 18.0 oz/week    30 Cans of beer per week  . Drug Use: Yes    Special: Cocaine, IV, Marijuana, Benzodiazepines     Comment: LSD, crack, oxycodone, heroin  . Sexual Activity: Not on file   Other Topics Concern  . None   Social History Narrative   Risk to Self: Is patient at risk for suicide?: Yes What has been your use of drugs/alcohol within the last 12 months?: Patient reports  drinking a half pint of liquor and a 12 pack of beer daily for years. He also reports abusing as much crack as he can afford on weekends.  Risk to Others: No  Prior Inpatient Therapy: Yes  Prior Outpatient Therapy: Yes  Level of Care:  OP  Hospital Course: Jeffery Hancock 24 y.o. male presents with worsening depression and anxiety. Patient also presented with suicidal thoughts with a plan to lay on train tracks; cocaine and alcohol abuse. Requesting alcohol detox. Patient states "I was extremely drunk and I was feeling like I didn't want to live anymore. I don't look at death like other people do like it is horrible; the way I look at it, it doesn't really seam that bad." Patient states that his biggest stressor is his anxiety "The last thing that just screwed me up is my mom's boyfriend's son just died from a drug overdose; and my younger brother died this past 09-22-22 and the February 22, 2023 before that my little cousin died.  Jeffery Hancock was admitted to the hospital with his UDS reports showing positive cocaine & BAL of 180 per toxicology test results. He was here for alcohol/drug detox. His recent lab reports indicated elevated liver enzymes (AST, ALT) possibility from chronic alcoholism. As a result, not a candidate for Librium detox protocols. Jeffery Hancock's detoxification treatment was achieved using Ativan detox regimen on a tapering dose format. This was used in place of Librium detox protocol because Librium is a long acting benzodiazepine with a long half life. If used for this detox treatment, will impose on a already compromised liver enzymes. By using Ativan detox protocols, Jeffery Hancock received a cleaner detoxification treatment without the lingering adverse effects of the long acting Librium capsules. He was also enrolled in the group counseling sessions, AA/NA meetings being offered and held on this unit. He participated and learned coping skills. Jeffery Hancock presented with no other significant medical issues that required  treatments. He tolerated his treatment regimen without any adverse effects reported.  Besides the detoxification treatments, Jeffery Hancock also was medicated & discharged on Trazodone 100 mg at bedtime for insomnia, Gabapentin 100 mg for agitation, Duloxetine 30 mg for depression & Hydroxyzine 50 mg Q hs for insomnia. Jeffery Hancock has completed detox treatment and his mood is stable. This is evidenced by his reports of improved mood, absence substance withdrawal symptoms & suicidal ideations. He will resume substance abuse treatment at the [address] [facility] [place] in [place]. He is provided with all the necessary information needed to make this appointment without problems.  Upon discharge, Jeffery Hancock adamantly denies any suicidal, homicidal ideations, auditory, visual hallucinations, delusional thoughts, paranoia and or withdrawal symptoms. He left [facility] with all personal belongings in no apparent distress. He received 4 days worth supply samples of his [facility] discharge medications provided by [facility] pharmacy. Transportation per Mother.  Consults:  psychiatry  Significant Diagnostic Studies:  labs: CBC with diff, CMP, UDS, toxicology tests, results reviewed, stable  Discharge Vitals:   Blood pressure 115/73, pulse 103, temperature 97.5 F (36.4 C), temperature source  Oral, resp. rate 17, height 6' (1.829 m), weight 127.007 kg (280 lb), SpO2 99 %. Body mass index is 37.97 kg/(m^2). Lab Results:   Results for orders placed or performed during the hospital encounter of 07/29/14 (from the past 72 hour(s))  TSH     Status: None   Collection Time: 08/02/14  7:12 PM  Result Value Ref Range   TSH 1.955 0.350 - 4.500 uIU/mL    Comment: Performed at [facility]    Physical Findings: AIMS: Facial and Oral Movements Muscles of Facial Expression: None, normal Lips and Perioral Area: None, normal Jaw: None, normal Tongue: None, normal,Extremity Movements Upper (arms, wrists, hands, fingers): None, normal Lower  (legs, knees, ankles, toes): None, normal, Trunk Movements Neck, shoulders, hips: None, normal, Overall Severity Severity of abnormal movements (highest score from questions above): None, normal Incapacitation due to abnormal movements: None, normal Patient's awareness of abnormal movements (rate only patient's report): No Awareness, Dental Status Current problems with teeth and/or dentures?: No Does patient usually wear dentures?: No  CIWA:  CIWA-Ar Total: 0 COWS:  COWS Total Score: 7   See Psychiatric Specialty Exam and Suicide Risk Assessment completed by Attending Physician prior to discharge.  Discharge destination:  Home  Is patient on multiple antipsychotic therapies at discharge:  No   Has Patient had three or more failed trials of antipsychotic monotherapy by history:  No    Recommended Plan for Multiple Antipsychotic Therapies: NA    Medication List    TAKE these medications      Indication   DULoxetine 30 MG capsule  Commonly known as:  CYMBALTA  Take 3 capsules (90 mg total) by mouth daily. For depression   Indication:  Major Depressive Disorder     gabapentin 100 MG capsule  Commonly known as:  NEURONTIN  Take 2 capsules (200 mg total) by mouth 2 (two) times daily. For agitation   Indication:  Agitation     hydrOXYzine 50 MG tablet  Commonly known as:  ATARAX/VISTARIL  Take 1 tablet (50 mg total) by mouth at bedtime as needed (sleep).   Indication:  Insomnia     nicotine 21 mg/24hr patch  Commonly known as:  NICODERM CQ - dosed in mg/24 hours  Place 1 patch (21 mg total) onto the skin daily. For nicotine addiction   Indication:  Nicotine Addiction     traZODone 100 MG tablet  Commonly known as:  DESYREL  Take 1 tablet (100 mg total) by mouth at bedtime as needed for sleep.   Indication:  Trouble Sleeping       Follow-up Information    Follow up with [facility] On 08/03/2014.   Why:  You have been accepted to [facility] on  Tuesday, Aug 03, 2014 upon arrival     [place] information:   [address] [place], [place]  [address] [phone]     Follow-up recommendations: Activity:  As tolerated Diet: As recommended by your primary care doctor. Keep all scheduled follow-up appointments as recommended.   Comments: Take all your medications as prescribed by your mental healthcare provider. Report any adverse effects and or reactions from your medicines to your outpatient provider promptly. Patient is instructed and cautioned to not engage in alcohol and or illegal drug use while on prescription medicines. In the event of worsening symptoms, patient is instructed to call the crisis hotline, 911 and or go to the nearest ED for appropriate evaluation and treatment of symptoms.  Follow-up with your primary care provider for your other medical issues, concerns and or health care needs.   Total Discharge Time: Greater than 30 minutes  Signed: Sanjuana Kava, PMHNP, FNP-BC 08/03/2014, 12:17 PM    Patient seen, Suicide Assessment Completed.  Disposition Plan Reviewed

## 2014-08-03 NOTE — Progress Notes (Signed)
  [facility] :  Will you be returning to the same living situation after discharge:  No.  Patient discharging [facility]. At discharge, do you have transportation home?: Yes,  Mother to transport patient to the facility Do you have the ability to pay for your medications: Yes,  Patient is able to obtain medications  Release of information consent forms completed and in the chart;  Patient's signature needed at discharge.  Patient to Follow up at: Follow-up Information    Follow up with [facility] On 08/03/2014.   Why:  You have been accepted [facility] on Tuesday, Aug 03, 2014 upon arrival     Contact information:   [address] [place]  [id number] [phone]      Patient denies SI/HI:   Patient no longer endorsing SI/HI or other thoughts of self harm.   Safety Planning and Suicide Prevention discussed:   .Reviewed with all patients during discharge planning group  Have you used any form of tobacco in the last 30 days? (Cigarettes, Smokeless Tobacco, Cigars, and/or Pipes): Yes   Patient declined referral to Quitline.   Wynn BankerHodnett, Jeffery Leaf Hairston 08/03/2014, 9:45 AM

## 2014-08-03 NOTE — Progress Notes (Signed)
D: Pt denies SI/HI/AV. Pt is pleasant and cooperative. Pt stated he felt "better". Pt hopeful that his anxiety stays under control. Pt stated he got a little anxious , but felt better as the day went on .    A: Pt was offered support and encouragement. Pt was given scheduled medications. Pt was encourage to attend groups. Q 15 minute checks were done for safety.    R:Pt attends groups and interacts well with peers and staff. Pt is taking medication. Pt has no complaints.Pt receptive to treatment and safety maintained on unit.

## 2014-08-03 NOTE — [facility] Group Notes (Signed)
The focus of this group is to educate the patient on the purpose and policies of crisis stabilization and provide a format to answer questions about their admission.  The group details unit policies and expectations of patients while admitted.  Patient attended 0900 nurse education orientation group this morning.  Patient actively participated, appropriate affect, alert, appropriate insight and engagement.  Today patient will work on 3 goals for discharge.  

## 2014-10-08 ENCOUNTER — Emergency Department (HOSPITAL_COMMUNITY)
Admission: EM | Admit: 2014-10-08 | Discharge: 2014-10-08 | Disposition: A | Discharge status: Home / Self Care | Payer: Federal, State, Local not specified - PPO | Attending: Emergency Medicine | Admitting: Emergency Medicine

## 2014-10-08 ENCOUNTER — Encounter (HOSPITAL_COMMUNITY): Payer: Self-pay | Admitting: Emergency Medicine

## 2014-10-08 DIAGNOSIS — F410 Panic disorder [episodic paroxysmal anxiety] without agoraphobia: Secondary | ICD-10-CM | POA: Diagnosis present

## 2014-10-08 DIAGNOSIS — Z8639 Personal history of other endocrine, nutritional and metabolic disease: Secondary | ICD-10-CM | POA: Diagnosis not present

## 2014-10-08 DIAGNOSIS — Z720 Tobacco use: Secondary | ICD-10-CM | POA: Insufficient documentation

## 2014-10-08 DIAGNOSIS — Z79899 Other long term (current) drug therapy: Secondary | ICD-10-CM | POA: Insufficient documentation

## 2014-10-08 DIAGNOSIS — F329 Major depressive disorder, single episode, unspecified: Secondary | ICD-10-CM | POA: Insufficient documentation

## 2014-10-08 NOTE — Discharge Instructions (Signed)
Panic Attacks Follow up with your primary care physician as needed. Return if concern for any reason. Panic attacks are sudden, short-livedsurges of severe anxiety, fear, or discomfort. They may occur for no reason when you are relaxed, when you are anxious, or when you are sleeping. Panic attacks may occur for a number of reasons:   Healthy people occasionally have panic attacks in extreme, life-threatening situations, such as war or natural disasters. Normal anxiety is a protective mechanism of the body that helps [place] react to danger (fight or flight response).  Panic attacks are often seen with anxiety disorders, such as panic disorder, social anxiety disorder, generalized anxiety disorder, and phobias. Anxiety disorders cause excessive or uncontrollable anxiety. They may interfere with your relationships or other life activities.  Panic attacks are sometimes seen with other mental illnesses, such as depression and posttraumatic stress disorder.  Certain medical conditions, prescription medicines, and drugs of abuse can cause panic attacks. SYMPTOMS  Panic attacks start suddenly, peak within 20 minutes, and are accompanied by four or more of the following symptoms:  Pounding heart or fast heart rate (palpitations).  Sweating.  Trembling or shaking.  Shortness of breath or feeling smothered.  Feeling choked.  Chest pain or discomfort.  Nausea or strange feeling in your stomach.  Dizziness, light-headedness, or feeling like you will faint.  Chills or hot flushes.  Numbness or tingling in your lips or hands and feet.  Feeling that things are not real or feeling that you are not yourself.  Fear of losing control or going crazy.  Fear of dying. Some of these symptoms can mimic serious medical conditions. For example, you may think you are having a heart attack. Although panic attacks can be very scary, they are not life threatening. DIAGNOSIS  Panic attacks are diagnosed  through an assessment by your health care provider. Your health care provider will ask questions about your symptoms, such as where and when they occurred. Your health care provider will also ask about your medical history and use of alcohol and drugs, including prescription medicines. Your health care provider may order blood tests or other studies to rule out a serious medical condition. Your health care provider may refer you to a mental health professional for further evaluation. TREATMENT   Most healthy people who have one or two panic attacks in an extreme, life-threatening situation will not require treatment.  The treatment for panic attacks associated with anxiety disorders or other mental illness typically involves counseling with a mental health professional, medicine, or a combination of both. Your health care provider will help determine what treatment is best for you.  Panic attacks due to physical illness usually go away with treatment of the illness. If prescription medicine is causing panic attacks, talk with your health care provider about stopping the medicine, decreasing the dose, or substituting another medicine.  Panic attacks due to alcohol or drug abuse go away with abstinence. Some adults need professional help in order to stop drinking or using drugs. HOME CARE INSTRUCTIONS   Take all medicines as directed by your health care provider.   Schedule and attend follow-up visits as directed by your health care provider. It is important to keep all your appointments. SEEK MEDICAL CARE IF:  You are not able to take your medicines as prescribed.  Your symptoms do not improve or get worse. SEEK IMMEDIATE MEDICAL CARE IF:   You experience panic attack symptoms that are different than your usual symptoms.  You have serious  thoughts about hurting yourself or others.  You are taking medicine for panic attacks and have a serious side effect. MAKE SURE YOU:  Understand these  instructions.  Will watch your condition.  Will get help right away if you are not doing well or get worse. Document Released: 02/19/2005 Document Revised: 02/24/2013 Document Reviewed: 10/03/2012 [facility] Patient Information 2015 [place], [place]. This information is not intended to replace advice given to you by your health care provider. Make sure you discuss any questions you have with your health care provider.

## 2014-10-08 NOTE — ED Provider Notes (Signed)
CSN: [id number]     Arrival date & time 10/08/14  1149 History   First MD Initiated Contact with Patient 10/08/14 1230     Chief Complaint  Patient presents with  . Panic Attack     (Consider location/radiation/quality/duration/timing/severity/associated sxs/prior Treatment) HPI Patient reports he had a panic attack 3 hours prior to coming here. He suffers similar panic attacks frequently. Symptoms have resolved since he treated himself with Benadryl prior to coming here. He is presently asymptomatic. Nothing made symptoms better or worse. Panic attacks come frequently and are unprovoked. This one occurred while at work. No other associated symptoms Past Medical History  Diagnosis Date  . Panic attacks   . Anxiety   . Hyperlipemia   . Alcohol abuse   . Polysubstance abuse     crack, oxycodone, heroin, etoh  . Depression    denies alcohol or illicit drugs for the past 65 days. No history of IV drug use History reviewed. No pertinent past surgical history. Family History  Problem Relation Age of Onset  . Diabetes Other   . Hypertension Other   . Cancer Other   . Heart failure Other   . Alcohol abuse Father   . Depression Brother   . Bipolar disorder Brother    History  Substance Use Topics  . Smoking status: Current Every Day Smoker -- 1.00 packs/day    Types: Cigarettes  . Smokeless tobacco: Not on file  . Alcohol Use: 18.0 oz/week    30 Cans of beer per week    Review of Systems  Constitutional: Negative.   HENT: Negative.   Respiratory: Negative.   Cardiovascular: Negative.   Gastrointestinal: Negative.   Musculoskeletal: Negative.   Skin: Negative.   Neurological: Negative.   Psychiatric/Behavioral: The patient is nervous/anxious.   All other systems reviewed and are negative.     Allergies  Review of patient's allergies indicates no known allergies.  Home Medications   Prior to Admission medications   Medication Sig Start Date End Date Taking?  Authorizing Provider  DULoxetine (CYMBALTA) 30 MG capsule Take 3 capsules (90 mg total) by mouth daily. For depression 08/03/14   Sanjuana Kava, NP  gabapentin (NEURONTIN) 100 MG capsule Take 2 capsules (200 mg total) by mouth 2 (two) times daily. For agitation 08/03/14   Sanjuana Kava, NP  hydrOXYzine (ATARAX/VISTARIL) 50 MG tablet Take 1 tablet (50 mg total) by mouth at bedtime as needed (sleep). 08/03/14   Sanjuana Kava, NP  nicotine (NICODERM CQ - DOSED IN MG/24 HOURS) 21 mg/24hr patch Place 1 patch (21 mg total) onto the skin daily. For nicotine addiction 08/03/14   Sanjuana Kava, NP  traZODone (DESYREL) 100 MG tablet Take 1 tablet (100 mg total) by mouth at bedtime as needed for sleep. 08/03/14   Sanjuana Kava, NP   BP 150/94 mmHg  Pulse 138  Temp(Src) 98.6 F (37 C) (Oral)  Resp 18  Ht 6' (1.829 m)  Wt 280 lb (127.007 kg)  BMI 37.97 kg/m2  SpO2 100% Physical Exam  Constitutional: He appears well-developed and well-nourished.  HENT:  Head: Normocephalic and atraumatic.  Eyes: Conjunctivae are normal. Pupils are equal, round, and reactive to light.  Neck: Neck supple. No tracheal deviation present. No thyromegaly present.  Cardiovascular: Normal rate and regular rhythm.   No murmur heard. Pulmonary/Chest: Effort normal and breath sounds normal.  Abdominal: Soft. Bowel sounds are normal. He exhibits no distension. There is no tenderness.  Musculoskeletal: Normal range  of motion. He exhibits no edema or tenderness.  Neurological: He is alert. Coordination normal.  Skin: Skin is warm and dry. No rash noted.  Psychiatric: He has a normal mood and affect.  Nursing note and vitals reviewed.   ED Course  Procedures (including critical care time) Labs Review Labs Reviewed - No data to display  Imaging Review No results found.   EKG Interpretation   Date/Time:  Friday October 08 2014 12:18:51 EDT Ventricular Rate:  119 PR Interval:  138 QRS Duration: 92 QT Interval:  337 QTC  Calculation: 474 R Axis:   68 Text Interpretation:  Sinus tachycardia Inferior infarct, old  Anterolateral Q wave, probably normal for age No significant change since  last tracing Confirmed by Ethelda Chick  MD, Precious Segall [phone]) on 10/08/2014 12:30:46  PM     Patient's heart rate slowed spontaneously to normal rate. 12:45 PM he appears calm and feels ready to go home MDM  Plan follow-up with PMD as needed. Diagnosis panic attack Final diagnoses:  None        Doug Sou, MD 10/08/14 1248

## 2014-10-08 NOTE — ED Notes (Signed)
Pt states that he was at work and became very anxious.  States that he feels shaky and nervous.  Took Benadryl x3 to try and calm down but is not helping.  States feels like heart is racing.

## 2015-02-06 ENCOUNTER — Encounter (HOSPITAL_COMMUNITY): Payer: Self-pay

## 2015-02-06 ENCOUNTER — Emergency Department (HOSPITAL_COMMUNITY)
Admission: EM | Admit: 2015-02-06 | Discharge: 2015-02-06 | Disposition: A | Discharge status: Home / Self Care | Payer: Federal, State, Local not specified - PPO | Attending: Emergency Medicine | Admitting: Emergency Medicine

## 2015-02-06 DIAGNOSIS — F17210 Nicotine dependence, cigarettes, uncomplicated: Secondary | ICD-10-CM | POA: Diagnosis not present

## 2015-02-06 DIAGNOSIS — Z8639 Personal history of other endocrine, nutritional and metabolic disease: Secondary | ICD-10-CM | POA: Diagnosis not present

## 2015-02-06 DIAGNOSIS — F329 Major depressive disorder, single episode, unspecified: Secondary | ICD-10-CM | POA: Insufficient documentation

## 2015-02-06 DIAGNOSIS — F410 Panic disorder [episodic paroxysmal anxiety] without agoraphobia: Secondary | ICD-10-CM | POA: Insufficient documentation

## 2015-02-06 DIAGNOSIS — F1310 Sedative, hypnotic or anxiolytic abuse, uncomplicated: Secondary | ICD-10-CM | POA: Diagnosis not present

## 2015-02-06 DIAGNOSIS — F1010 Alcohol abuse, uncomplicated: Secondary | ICD-10-CM | POA: Diagnosis not present

## 2015-02-06 DIAGNOSIS — F419 Anxiety disorder, unspecified: Secondary | ICD-10-CM | POA: Diagnosis present

## 2015-02-06 LAB — COMPREHENSIVE METABOLIC PANEL
ALT: 60 U/L (ref 17–63)
AST: 37 U/L (ref 15–41)
Albumin: 4.6 g/dL (ref 3.5–5.0)
Alkaline Phosphatase: 81 U/L (ref 38–126)
Anion gap: 15 (ref 5–15)
BUN: 12 mg/dL (ref 6–20)
CHLORIDE: 106 mmol/L (ref 101–111)
CO2: 21 mmol/L — ABNORMAL LOW (ref 22–32)
Calcium: 9.3 mg/dL (ref 8.9–10.3)
Creatinine, Ser: 0.83 mg/dL (ref 0.61–1.24)
GFR calc non Af Amer: >60 mL/min (ref >60)
Glucose, Bld: 93 mg/dL (ref 65–99)
POTASSIUM: 3.7 mmol/L (ref 3.5–5.1)
SODIUM: 142 mmol/L (ref 135–145)
Total Bilirubin: 0.5 mg/dL (ref 0.3–1.2)
Total Protein: 8.1 g/dL (ref 6.5–8.1)

## 2015-02-06 LAB — CBC WITH DIFFERENTIAL/PLATELET
Basophils Absolute: 0 10*3/uL (ref 0.0–0.1)
Basophils Relative: 0 %
EOS ABS: 0 10*3/uL (ref 0.0–0.7)
EOS PCT: 0 %
HCT: 49 % (ref 39.0–52.0)
Hemoglobin: 17.8 g/dL — ABNORMAL HIGH (ref 13.0–17.0)
Lymphocytes Relative: 24 %
Lymphs Abs: 2.4 10*3/uL (ref 0.7–4.0)
MCH: 32.1 pg (ref 26.0–34.0)
MCHC: 36.3 g/dL — ABNORMAL HIGH (ref 30.0–36.0)
MCV: 88.3 fL (ref 78.0–100.0)
MONO ABS: 0.5 10*3/uL (ref 0.1–1.0)
MONOS PCT: 5 %
Neutro Abs: 7.1 10*3/uL (ref 1.7–7.7)
Neutrophils Relative %: 71 %
PLATELETS: 198 10*3/uL (ref 150–400)
RBC: 5.55 MIL/uL (ref 4.22–5.81)
RDW: 12.9 % (ref 11.5–15.5)
WBC: 10 10*3/uL (ref 4.0–10.5)

## 2015-02-06 LAB — ACETAMINOPHEN LEVEL: Acetaminophen (Tylenol), Serum: <10 ug/mL — ABNORMAL LOW (ref 10–30)

## 2015-02-06 LAB — ETHANOL: Alcohol, Ethyl (B): 178 mg/dL — ABNORMAL HIGH (ref <5)

## 2015-02-06 LAB — RAPID URINE DRUG SCREEN, HOSP PERFORMED
AMPHETAMINES: NOT DETECTED
BENZODIAZEPINES: POSITIVE — AB
Barbiturates: NOT DETECTED
Cocaine: NOT DETECTED
OPIATES: NOT DETECTED
Tetrahydrocannabinol: NOT DETECTED

## 2015-02-06 LAB — SALICYLATE LEVEL

## 2015-02-06 NOTE — ED Provider Notes (Signed)
CSN: [id number]     Arrival date & time 02/06/15  0125 History   First MD Initiated Contact with Patient 02/06/15 0228     Chief Complaint  Patient presents with  . Drug Overdose  . V70.1     Patient is a 24 y.o. male presenting with Overdose. The history is provided by the patient and a parent.  Drug Overdose This is a new problem. The current episode started 12 to 24 hours ago. The problem occurs constantly. The problem has been gradually improving. Pertinent negatives include no chest pain, no abdominal pain, no headaches and no shortness of breath. Nothing aggravates the symptoms. Nothing relieves the symptoms.    Past Medical History  Diagnosis Date  . Panic attacks   . Anxiety   . Hyperlipemia   . Alcohol abuse   . Polysubstance abuse     crack, oxycodone, heroin, etoh  . Depression    History reviewed. No pertinent past surgical history. Family History  Problem Relation Age of Onset  . Diabetes Other   . Hypertension Other   . Cancer Other   . Heart failure Other   . Alcohol abuse Father   . Depression Brother   . Bipolar disorder Brother    Social History  Substance Use Topics  . Smoking status: Current Every Day Smoker -- 1.00 packs/day    Types: Cigarettes  . Smokeless tobacco: None  . Alcohol Use: 18.0 oz/week    30 Cans of beer per week    Review of Systems  Constitutional: Negative for fever.  Respiratory: Negative for shortness of breath.   Cardiovascular: Negative for chest pain.  Gastrointestinal: Negative for abdominal pain.  Neurological: Negative for headaches.  Psychiatric/Behavioral: Negative for suicidal ideas. The patient is nervous/anxious.   All other systems reviewed and are negative.     Allergies  Review of patient's allergies indicates no known allergies.  Home Medications   Prior to Admission medications   Medication Sig Start Date End Date Taking? Authorizing Provider  DULoxetine (CYMBALTA) 30 MG capsule Take 3 capsules (90  mg total) by mouth daily. For depression Patient not taking: Reported on 10/08/2014 08/03/14   Sanjuana KavaAgnes I Nwoko, NP  gabapentin (NEURONTIN) 100 MG capsule Take 2 capsules (200 mg total) by mouth 2 (two) times daily. For agitation Patient not taking: Reported on 10/08/2014 08/03/14   Sanjuana KavaAgnes I Nwoko, NP  hydrOXYzine (ATARAX/VISTARIL) 50 MG tablet Take 1 tablet (50 mg total) by mouth at bedtime as needed (sleep). Patient not taking: Reported on 10/08/2014 08/03/14   Sanjuana KavaAgnes I Nwoko, NP  nicotine (NICODERM CQ - DOSED IN MG/24 HOURS) 21 mg/24hr patch Place 1 patch (21 mg total) onto the skin daily. For nicotine addiction Patient not taking: Reported on 10/08/2014 08/03/14   Sanjuana KavaAgnes I Nwoko, NP  traZODone (DESYREL) 100 MG tablet Take 1 tablet (100 mg total) by mouth at bedtime as needed for sleep. Patient not taking: Reported on 10/08/2014 08/03/14   Sanjuana KavaAgnes I Nwoko, NP   BP 126/66 mmHg  Pulse 104  Temp(Src) 98.7 F (37.1 C) (Oral)  Resp 16  Ht 6' (1.829 m)  Wt 127.914 kg  BMI 38.24 kg/m2  SpO2 98% Physical Exam CONSTITUTIONAL: Well developed/well nourished, anxious HEAD: Normocephalic/atraumatic EYES: EOMI/PERRL ENMT: Mucous membranes moist NECK: supple no meningeal signs SPINE/BACK:entire spine nontender CV: S1/S2 noted, no murmurs/rubs/gallops noted LUNGS: Lungs are clear to auscultation bilaterally, no apparent distress ABDOMEN: soft, nontender, no rebound or guarding, bowel sounds noted throughout abdomen NEURO: Pt  is awake/alert/appropriate, moves all extremitiesx4.  No facial droop.   EXTREMITIES: pulses normal/equal, full ROM SKIN: warm, color normal PSYCH: anxious  ED Course  Procedures  Labs Review Labs Reviewed  CBC WITH DIFFERENTIAL/PLATELET - Abnormal; Notable for the following:    Hemoglobin 17.8 (*)    MCHC 36.3 (*)    All other components within normal limits  COMPREHENSIVE METABOLIC PANEL - Abnormal; Notable for the following:    CO2 21 (*)    All other components within normal limits   ETHANOL - Abnormal; Notable for the following:    Alcohol, Ethyl (B) 178 (*)    All other components within normal limits  URINE RAPID DRUG SCREEN, HOSP PERFORMED - Abnormal; Notable for the following:    Benzodiazepines POSITIVE (*)    All other components within normal limits  ACETAMINOPHEN LEVEL - Abnormal; Notable for the following:    Acetaminophen (Tylenol), Serum <10 (*)    All other components within normal limits  SALICYLATE LEVEL    I have personally reviewed and evaluated these lab results as part of my medical decision-making.   EKG Interpretation   Date/Time:  Sunday February 06 2015 02:11:38 EST Ventricular Rate:  113 PR Interval:  142 QRS Duration: 82 QT Interval:  316 QTC Calculation: 433 R Axis:   151 Text Interpretation:  Sinus tachycardia Right axis deviation Inferior  infarct , age undetermined Abnormal ECG T wave inversion in lateral leads  Confirmed by Bebe Shaggy  MD, Maleeka Sabatino ([id number]) on 02/06/2015 2:29:58 AM     3:21 AM Pt reports over the past day he took ativan/valium and ETOH due to severe panic attacks He denies SI He is awake/alert, mildly anxious He would like to seek help but does not want to be admitted He does not have psychiatrist or therapist He is here with mother They are requesting outpatient info for close f/u I feel he is safe for d/c home given outpatient resources  MDM   Final diagnoses:  None    Nursing notes including past medical history and social history reviewed and considered in documentation Labs/vital reviewed myself and considered during evaluation     Jeffery Rhine, MD 02/06/15 [phone]

## 2015-02-06 NOTE — Discharge Instructions (Signed)
° °  [place] [facility] ° °[facility]  [employer] [facility] °[address]                 [address]         [address]  °[place]                                               [place]                              [place] °Phone:  [phone]                                  Phone:  [phone]                   Phone:  [phone] ° °[facility], [phone] °- [place] [facility] - [facility]- [phone] °      -     [facility] in [place], [address],                                  [phone], Insurance ° °[place] Child Abuse Hotline °(336) 342-1394 or (336) 342-3537 (After Hours) ° ° °Behavioral Health Services ° °Substance Abuse Resources: °- [facility]  336-882-2125 °- [facility] 336-784-9470 °- [facility] 336-285-9073 °- [facility] 336-845-3988 °- Residential & Outpatient Substance Abuse Program  800-659-3381 ° °Psychological Services: °- [facility]  832-9600 °- [facility]  378-7881 °- [facility], [address], [place], ACCESS LINE: 1-800-853-5163 or 336-641-4981, Http://www.[facility].com/services/adult.htm ° °-  °- [facility]- 342-8273 °- [facility]- 703-3100 °- [facility]- 570-6415 ° ° ° ° °

## 2015-02-06 NOTE — ED Notes (Signed)
Pt states he has taken 15 mg of Valium over the course of the day, and approx 6 mg of Ativan over the past couple of hours.  Pt denies taking the meds in an attempt to harm himself. Pt states he has panic attacks and anxiety and is tired of feeling that way.   Pt also admits to drinking a pint of whiskey and 8-9 beers.   Pt has history of SI attempts by trying to jump in front of a car or a train.

## 2015-03-31 ENCOUNTER — Emergency Department (HOSPITAL_COMMUNITY)
Admission: EM | Admit: 2015-03-31 | Discharge: 2015-03-31 | Disposition: A | Discharge status: Home / Self Care | Payer: Federal, State, Local not specified - PPO | Attending: Emergency Medicine | Admitting: Emergency Medicine

## 2015-03-31 ENCOUNTER — Encounter (HOSPITAL_COMMUNITY): Payer: Self-pay

## 2015-03-31 DIAGNOSIS — E860 Dehydration: Secondary | ICD-10-CM | POA: Insufficient documentation

## 2015-03-31 DIAGNOSIS — F1010 Alcohol abuse, uncomplicated: Secondary | ICD-10-CM

## 2015-03-31 DIAGNOSIS — F410 Panic disorder [episodic paroxysmal anxiety] without agoraphobia: Secondary | ICD-10-CM | POA: Diagnosis not present

## 2015-03-31 DIAGNOSIS — F329 Major depressive disorder, single episode, unspecified: Secondary | ICD-10-CM | POA: Diagnosis not present

## 2015-03-31 DIAGNOSIS — R000 Tachycardia, unspecified: Secondary | ICD-10-CM | POA: Insufficient documentation

## 2015-03-31 DIAGNOSIS — F17210 Nicotine dependence, cigarettes, uncomplicated: Secondary | ICD-10-CM | POA: Diagnosis not present

## 2015-03-31 DIAGNOSIS — F1110 Opioid abuse, uncomplicated: Secondary | ICD-10-CM | POA: Diagnosis not present

## 2015-03-31 LAB — ETHANOL: ALCOHOL ETHYL (B): 42 mg/dL — AB (ref <5)

## 2015-03-31 LAB — CBC WITH DIFFERENTIAL/PLATELET
BASOS ABS: 0 10*3/uL (ref 0.0–0.1)
BASOS PCT: 0 %
Eosinophils Absolute: 0.1 10*3/uL (ref 0.0–0.7)
Eosinophils Relative: 1 %
HCT: 54.6 % — ABNORMAL HIGH (ref 39.0–52.0)
HEMOGLOBIN: 19.6 g/dL — AB (ref 13.0–17.0)
Lymphocytes Relative: 35 %
Lymphs Abs: 4.3 10*3/uL — ABNORMAL HIGH (ref 0.7–4.0)
MCH: 31.4 pg (ref 26.0–34.0)
MCHC: 35.9 g/dL (ref 30.0–36.0)
MCV: 87.4 fL (ref 78.0–100.0)
Monocytes Absolute: 1 10*3/uL (ref 0.1–1.0)
Monocytes Relative: 8 %
NEUTROS ABS: 6.8 10*3/uL (ref 1.7–7.7)
NEUTROS PCT: 56 %
Platelets: 272 10*3/uL (ref 150–400)
RBC: 6.25 MIL/uL — ABNORMAL HIGH (ref 4.22–5.81)
RDW: 13.5 % (ref 11.5–15.5)
WBC: 12.2 10*3/uL — ABNORMAL HIGH (ref 4.0–10.5)

## 2015-03-31 LAB — COMPREHENSIVE METABOLIC PANEL
ALBUMIN: 4.9 g/dL (ref 3.5–5.0)
ALT: 74 U/L — ABNORMAL HIGH (ref 17–63)
AST: 41 U/L (ref 15–41)
Alkaline Phosphatase: 99 U/L (ref 38–126)
Anion gap: 17 — ABNORMAL HIGH (ref 5–15)
BILIRUBIN TOTAL: 0.8 mg/dL (ref 0.3–1.2)
BUN: 12 mg/dL (ref 6–20)
CO2: 21 mmol/L — ABNORMAL LOW (ref 22–32)
Calcium: 9.6 mg/dL (ref 8.9–10.3)
Chloride: 103 mmol/L (ref 101–111)
Creatinine, Ser: 1.1 mg/dL (ref 0.61–1.24)
GFR calc Af Amer: >60 mL/min (ref >60)
Glucose, Bld: 90 mg/dL (ref 65–99)
POTASSIUM: 3.1 mmol/L — AB (ref 3.5–5.1)
Sodium: 141 mmol/L (ref 135–145)
TOTAL PROTEIN: 8.8 g/dL — AB (ref 6.5–8.1)

## 2015-03-31 LAB — RAPID URINE DRUG SCREEN, HOSP PERFORMED
Amphetamines: NOT DETECTED
BENZODIAZEPINES: NOT DETECTED
Barbiturates: NOT DETECTED
COCAINE: NOT DETECTED
Opiates: NOT DETECTED
TETRAHYDROCANNABINOL: NOT DETECTED

## 2015-03-31 MED ORDER — SODIUM CHLORIDE 0.9 % IV BOLUS (SEPSIS): 1000.0000 mL | Freq: Once | INTRAVENOUS | Status: DC

## 2015-03-31 MED ORDER — POTASSIUM CHLORIDE CRYS ER 20 MEQ PO TBCR
40.0000 meq | EXTENDED_RELEASE_TABLET | Freq: Once | ORAL | Status: AC
  Administered 2015-03-31: 40 meq via ORAL
  Filled 2015-03-31: qty 2

## 2015-03-31 MED ORDER — SODIUM CHLORIDE 0.9 % IV BOLUS (SEPSIS)
1000.0000 mL | Freq: Once | INTRAVENOUS | Status: AC
  Administered 2015-03-31: 1000 mL via INTRAVENOUS

## 2015-03-31 MED ORDER — LORAZEPAM 2 MG/ML IJ SOLN
1.0000 mg | Freq: Once | INTRAMUSCULAR | Status: AC
  Administered 2015-03-31: 1 mg via INTRAVENOUS
  Filled 2015-03-31: qty 1

## 2015-03-31 NOTE — ED Provider Notes (Signed)
CSN: [id number]     Arrival date & time 03/31/15  1884 History   First MD Initiated Contact with Patient 03/31/15 [phone]     Chief Complaint  Patient presents with  . Addiction Problem     (Consider location/radiation/quality/duration/timing/severity/associated sxs/prior Treatment) HPI patient reports he started abusing alcohol and doing drugs at the age of 89. He states he was last in rehabilitation about 4 months ago and was sober for 3 months. However he has been relapsing off and on for the past month. He states he is abusing 3-4 times a week. He states he drinks a liter of whiskey and takes 30-40 mg of "oxys". He states he started using heroin about a year and a half ago. He states tonight he drank a liter of whiskey and took about 35 mg oxycodone. He states his whole body feels numb, he feels short of breath and feels like his heart is racing. He states he thinks he may be having a panic attack that he has had in the past. He denies chest pain, diaphoresis, nausea, vomiting, or fever. He does describe a dry smoker's cough.  Patient states he has not been on any of his medications for the past few months including Cymbalta, Neurontin, and trazodone    PCP [facility]  Past Medical History  Diagnosis Date  . Panic attacks   . Anxiety   . Hyperlipemia   . Alcohol abuse   . Polysubstance abuse     crack, oxycodone, heroin, etoh  . Depression    History reviewed. No pertinent past surgical history. Family History  Problem Relation Age of Onset  . Diabetes Other   . Hypertension Other   . Cancer Other   . Heart failure Other   . Alcohol abuse Father   . Depression Brother   . Bipolar disorder Brother    Social History  Substance Use Topics  . Smoking status: Current Every Day Smoker -- 1.00 packs/day    Types: Cigarettes  . Smokeless tobacco: None  . Alcohol Use: 18.0 oz/week    30 Cans of beer per week  employed  Review of Systems  All other systems reviewed and are  negative.     Allergies  Review of patient's allergies indicates no known allergies.  Home Medications   Prior to Admission medications   Medication Sig Start Date End Date Taking? Authorizing Provider  DULoxetine (CYMBALTA) 30 MG capsule Take 3 capsules (90 mg total) by mouth daily. For depression Patient not taking: Reported on 10/08/2014 08/03/14   Sanjuana Kava, NP  gabapentin (NEURONTIN) 100 MG capsule Take 2 capsules (200 mg total) by mouth 2 (two) times daily. For agitation Patient not taking: Reported on 10/08/2014 08/03/14   Sanjuana Kava, NP  hydrOXYzine (ATARAX/VISTARIL) 50 MG tablet Take 1 tablet (50 mg total) by mouth at bedtime as needed (sleep). Patient not taking: Reported on 10/08/2014 08/03/14   Sanjuana Kava, NP  nicotine (NICODERM CQ - DOSED IN MG/24 HOURS) 21 mg/24hr patch Place 1 patch (21 mg total) onto the skin daily. For nicotine addiction Patient not taking: Reported on 10/08/2014 08/03/14   Sanjuana Kava, NP  traZODone (DESYREL) 100 MG tablet Take 1 tablet (100 mg total) by mouth at bedtime as needed for sleep. Patient not taking: Reported on 10/08/2014 08/03/14   Sanjuana Kava, NP   BP 174/87 mmHg  Pulse 142  Temp(Src) 98.1 F (36.7 C) (Axillary)  Resp 26  Ht 6' (1.829 m)  Wt 280 lb (127.007 kg)  BMI 37.97 kg/m2  SpO2 100%  Vital signs normal except for hypertension and tachycardia  Physical Exam  Constitutional: He is oriented to person, place, and time. He appears well-developed and well-nourished.  Non-toxic appearance. He does not appear ill. He appears distressed.  Patient sitting on the side of bed, he states he feels better if he sits up rather than lays down.  HENT:  Head: Normocephalic and atraumatic.  Right Ear: External ear normal.  Left Ear: External ear normal.  Nose: Nose normal. No mucosal edema or rhinorrhea.  Mouth/Throat: Oropharynx is clear and moist and mucous membranes are normal. No dental abscesses or uvula swelling.  Eyes: Conjunctivae  and EOM are normal. Pupils are equal, round, and reactive to light.  Neck: Normal range of motion and full passive range of motion without pain. Neck supple.  Cardiovascular: Regular rhythm and normal heart sounds.  Tachycardia present.  Exam reveals no gallop and no friction rub.   No murmur heard. Pulmonary/Chest: Effort normal and breath sounds normal. No respiratory distress. He has no wheezes. He has no rhonchi. He has no rales. He exhibits no tenderness and no crepitus.  Abdominal: Soft. Normal appearance and bowel sounds are normal. He exhibits no distension. There is no tenderness. There is no rebound and no guarding.  Musculoskeletal: Normal range of motion. He exhibits no edema or tenderness.  Moves all extremities well.   Neurological: He is alert and oriented to person, place, and time. He has normal strength. No cranial nerve deficit.  Patient is noted to have some head twitching with his head twitching to his left shoulder  Skin: Skin is warm, dry and intact. No rash noted. No erythema. No pallor.  Face is flushed  Psychiatric: His speech is normal and behavior is normal. His mood appears anxious.  Nursing note and vitals reviewed.   ED Course  Procedures (including critical care time)  Medications  sodium chloride 0.9 % bolus 1,000 mL (1,000 mLs Intravenous Not Given 03/31/15 0545)  sodium chloride 0.9 % bolus 1,000 mL (0 mLs Intravenous Stopped 03/31/15 0451)  sodium chloride 0.9 % bolus 1,000 mL (0 mLs Intravenous Stopped 03/31/15 0642)  potassium chloride SA (K-DUR,KLOR-CON) CR tablet 40 mEq (40 mEq Oral Given 03/31/15 0545)  LORazepam (ATIVAN) injection 1 mg (1 mg Intravenous Given 03/31/15 0546)    Patient was given IV fluids. At this point I'm afraid giving Lopressor for his tachycardia and Baird Lyons has had some cocaine ingestion. I am going to wait for his urine drug screen to decide.  After reviewing patient's labs he was given Ativan 1 mg IV. He had gotten 2000 mL of IV  fluids. When I rechecked him at 5:35 AM he states he's feeling much better. He is now sitting comfortably on the stretcher. He is noted to have some episodic upper body jerking. His heart rate is now 113. He states he has not had the need to have urinary output. A third liter of normal saline was ordered.  Patient was given oral potassium for his hypokalemia.  Recheck at 6:30 AM. Patient's heart rate is 84. He has had his IV fluids. He states he has the need to have urinary output. He has also been drinking oral fluids. He states he knows he needs to stop drinking and taking drugs. He does not do it on a daily basis. He does not feel like he needs to go to rehabilitation this time. He will be given outpatient  referrals.  Patient has addiction to alcohol and narcotic pain medications. He does not abuse daily. He should be able to be detoxed without medications. He has recently been to rehabilitation so he should remember the steps to take to help him abstain. He states he is very motivated to see doesn't want to lose his house.  Labs Review Results for orders placed or performed during the hospital encounter of 03/31/15  Comprehensive metabolic panel  Result Value Ref Range   Sodium 141 135 - 145 mmol/L   Potassium 3.1 (L) 3.5 - 5.1 mmol/L   Chloride 103 101 - 111 mmol/L   CO2 21 (L) 22 - 32 mmol/L   Glucose, Bld 90 65 - 99 mg/dL   BUN 12 6 - 20 mg/dL   Creatinine, Ser 1.61 0.61 - 1.24 mg/dL   Calcium 9.6 8.9 - 09.6 mg/dL   Total Protein 8.8 (H) 6.5 - 8.1 g/dL   Albumin 4.9 3.5 - 5.0 g/dL   AST 41 15 - 41 U/L   ALT 74 (H) 17 - 63 U/L   Alkaline Phosphatase 99 38 - 126 U/L   Total Bilirubin 0.8 0.3 - 1.2 mg/dL   GFR calc non Af Amer >60 >60 mL/min   GFR calc Af Amer >60 >60 mL/min   Anion gap 17 (H) 5 - 15  Ethanol  Result Value Ref Range   Alcohol, Ethyl (B) 42 (H) <5 mg/dL  CBC with Differential  Result Value Ref Range   WBC 12.2 (H) 4.0 - 10.5 K/uL   RBC 6.25 (H) 4.22 - 5.81  MIL/uL   Hemoglobin 19.6 (H) 13.0 - 17.0 g/dL   HCT 04.5 (H) 40.9 - 81.1 %   MCV 87.4 78.0 - 100.0 fL   MCH 31.4 26.0 - 34.0 pg   MCHC 35.9 30.0 - 36.0 g/dL   RDW 91.4 78.2 - 95.6 %   Platelets 272 150 - 400 K/uL   Neutrophils Relative % 56 %   Neutro Abs 6.8 1.7 - 7.7 K/uL   Lymphocytes Relative 35 %   Lymphs Abs 4.3 (H) 0.7 - 4.0 K/uL   Monocytes Relative 8 %   Monocytes Absolute 1.0 0.1 - 1.0 K/uL   Eosinophils Relative 1 %   Eosinophils Absolute 0.1 0.0 - 0.7 K/uL   Basophils Relative 0 %   Basophils Absolute 0.0 0.0 - 0.1 K/uL  Urine rapid drug screen (hosp performed)  Result Value Ref Range   Opiates NONE DETECTED NONE DETECTED   Cocaine NONE DETECTED NONE DETECTED   Benzodiazepines NONE DETECTED NONE DETECTED   Amphetamines NONE DETECTED NONE DETECTED   Tetrahydrocannabinol NONE DETECTED NONE DETECTED   Barbiturates NONE DETECTED NONE DETECTED   Laboratory interpretation all normal except his hemoglobin has increased by about 2 g since his last visit consistent with dehydration, IV fluids have been ordered, mild metabolic acidosis again consistent with dehydration, mild alcohol ingestion     Imaging Review No results found. I have personally reviewed and evaluated these images and lab results as part of my medical decision-making.   EKG Interpretation   Date/Time:  Thursday March 31 2015 03:48:17 EST Ventricular Rate:  151 PR Interval:  129 QRS Duration: 75 QT Interval:  282 QTC Calculation: 447 R Axis:   83 Text Interpretation:  Sinus tachycardia Inferior infarct, old Since last  tracing rate faster (06 Feb 2015) Confirmed by [facility]  MD-I, Lamberto Dinapoli ([id number]) on  03/31/2015 4:47:28 AM      MDM  Final diagnoses:  Dehydration  Alcohol abuse  Narcotic abuse  Tachycardia    Plan discharge  Devoria Albe, MD, Concha Pyo, MD 03/31/15 [phone]

## 2015-03-31 NOTE — ED Notes (Signed)
Patient states he has taken a total of  oxycodone and a liter of Whiskey throughout the past 12 hours. Patient denies SI/HI.

## 2015-03-31 NOTE — ED Notes (Signed)
Patient resting in bed in a position of stated comfort. No needs voiced at this time. 

## 2015-03-31 NOTE — Discharge Instructions (Signed)
Drink plenty of fluids. STOP DRINKING AND TAKING THE PILLS!!!! Look at the outpatient therapy options and consider making an appointment to help you with your addiction and anxiety. Return if you feel like you might do something to harm yourself or others.     Emergency Department Resource Guide    Behavioral Health Resources in the Community: Intensive Outpatient Programs Organization         Address  Phone  Notes  [facility] Services [address], [place], [place] [phone]   [facility] [address], [place], [place] [phone]   [facility] [address] [place], [place]  [phone]   [facility] [address],  [place], [place] [phone] or [phone]   Substance Abuse Resources Organization         Address  Phone  Notes  [facility]  [phone]   [facility]  [phone]   [facility]  [phone]   [place]  [phone]   Residential & Outpatient Substance Abuse Program  [phone]   Psychological Services Organization         Address  Phone  Notes  [facility] Behavioral Health  [phone]   [facility] Services  [phone]   [facility] [address] [place] [phone] or [phone]    Mobile Crisis Teams Organization         Address  Phone  Notes  [facility], Mobile Crisis Care Unit  [phone]   [facility]  [address] [place], [place] [phone]   Doristine Locks [address] [place] [phone]    Self-Help/Support Groups Organization         Address  Phone             Notes  [facility] [place] - variety of support groups  336- I7437963 Call for more information  Narcotics Anonymous (NA), [facility] [address], [employer] [place]  2 meetings at this location   Statistician          Address  Phone  Notes  [facility] [address],    [place]  [phone]   [facility]  [address], [place] [address], [place], [place] [phone]   [facility] [address] [place], [place] [phone] Admissions: 8am-3pm M-F  [facility] [address],    [place], [place] [phone]   [facility] [address] [place], [place], [place] [phone]   [facility] [address]  [place], [place] [phone]   [facility] - Intensive Outpatient [address], [place], [place] [phone]   [facility] ([facility] [address] [place].,  [place], [place] [phone] or [phone]   [facility] ([facility]) [address], [place], [place] [phone] Accepts Medicaid  [facility] [address],  [place] [phone] Substance Abuse/Addiction Treatment   [facility] Organization         Address  Phone  Notes  [facility]  [phone]   Angie Fava, PhD [address] Ervin Knack [place], [place]   [phone] or ([phone]   [facility] Behavioral   [address] [place], [place] [phone]   [facility] [address] [place], [place] [phone] Insurance/Medicaid/sponsorship through [employer] and Families  [address]                                    [place], [place] [phone] Therapy/tele-psych/case  [facility] [address]   [place], [place] [phone]    Dr. Lolly Mustache  [phone]   [facility]  [employer] [facility] Dept. 1) [address], [place] 2) [address], [place] 3)  [address], [place] [phone]  [phone]   [facility] Child Abuse Hotline (308)550-5886 or [phone] (After Hours)

## 2015-03-31 NOTE — ED Notes (Signed)
Patient verbalizes understanding of discharge instructions, home care and follow up care. Patient ambulatory out of department at this time. 

## 2016-01-08 ENCOUNTER — Emergency Department (HOSPITAL_COMMUNITY)
Admission: EM | Admit: 2016-01-08 | Discharge: 2016-01-08 | Disposition: A | Discharge status: Home / Self Care | Payer: Federal, State, Local not specified - PPO | Attending: Emergency Medicine | Admitting: Emergency Medicine

## 2016-01-08 ENCOUNTER — Encounter (HOSPITAL_COMMUNITY): Payer: Self-pay

## 2016-01-08 ENCOUNTER — Emergency Department (HOSPITAL_COMMUNITY): Payer: Federal, State, Local not specified - PPO

## 2016-01-08 DIAGNOSIS — R0789 Other chest pain: Secondary | ICD-10-CM

## 2016-01-08 DIAGNOSIS — R05 Cough: Secondary | ICD-10-CM | POA: Diagnosis present

## 2016-01-08 DIAGNOSIS — J9801 Acute bronchospasm: Secondary | ICD-10-CM | POA: Diagnosis not present

## 2016-01-08 DIAGNOSIS — F17210 Nicotine dependence, cigarettes, uncomplicated: Secondary | ICD-10-CM | POA: Diagnosis not present

## 2016-01-08 DIAGNOSIS — Z79899 Other long term (current) drug therapy: Secondary | ICD-10-CM | POA: Diagnosis not present

## 2016-01-08 MED ORDER — HYDROXYZINE HCL 25 MG PO TABS: 25.0000 mg | ORAL_TABLET | Freq: Four times a day (QID) | ORAL | 0 refills | Status: DC | PRN

## 2016-01-08 MED ORDER — BENZONATATE 100 MG PO CAPS: 100.0000 mg | ORAL_CAPSULE | Freq: Three times a day (TID) | ORAL | 0 refills | Status: DC | PRN

## 2016-01-08 MED ORDER — PREDNISONE 20 MG PO TABS
ORAL_TABLET | ORAL | 0 refills | Status: DC
Start: 2016-01-08 — End: 2016-03-16

## 2016-01-08 MED ORDER — KETOROLAC TROMETHAMINE 60 MG/2ML IM SOLN
60.0000 mg | Freq: Once | INTRAMUSCULAR | Status: AC
  Administered 2016-01-08: 60 mg via INTRAMUSCULAR
  Filled 2016-01-08: qty 2

## 2016-01-08 MED ORDER — IBUPROFEN 600 MG PO TABS: 600.0000 mg | ORAL_TABLET | Freq: Four times a day (QID) | ORAL | 0 refills | Status: DC | PRN

## 2016-01-08 MED ORDER — LEVALBUTEROL HCL 1.25 MG/0.5ML IN NEBU
1.2500 mg | INHALATION_SOLUTION | Freq: Once | RESPIRATORY_TRACT | Status: AC
  Administered 2016-01-08: 1.25 mg via RESPIRATORY_TRACT
  Filled 2016-01-08: qty 0.5

## 2016-01-08 MED ORDER — ALBUTEROL SULFATE HFA 108 (90 BASE) MCG/ACT IN AERS
1.0000 | INHALATION_SPRAY | RESPIRATORY_TRACT | Status: DC | PRN
  Administered 2016-01-08: 2 via RESPIRATORY_TRACT
  Filled 2016-01-08: qty 6.7

## 2016-01-08 MED ORDER — ALPRAZOLAM 0.5 MG PO TABS
0.5000 mg | ORAL_TABLET | Freq: Once | ORAL | Status: AC
  Administered 2016-01-08: 0.5 mg via ORAL
  Filled 2016-01-08: qty 1

## 2016-01-08 NOTE — ED Triage Notes (Signed)
Pt reports having cold symptoms and coughing x 1 week. States he coughed hard this morning and heard a crack and felt a sharp pain in right side. Now hurts with certain movement. Denies Sob

## 2016-01-08 NOTE — ED Provider Notes (Signed)
[facility]-EMERGENCY DEPT Provider Note   CSN: [id number] Arrival date & time: 01/08/16  0909  By signing my name below, I, Nelwyn SalisburyJoshua Fowler, attest that this documentation has been prepared under the direction and in the presence of Loren Raceravid Shaquana Buel, MD . Electronically Signed: Nelwyn SalisburyJoshua Fowler, Scribe. 01/08/2016. 9:26 AM.  History   Chief Complaint Chief Complaint  Patient presents with  . Cough   The history is provided by the patient. No language interpreter was used.    HPI Comments:  Jeffery Hancock is a 25 y.o. male with PMHx of Anxiety and Panic attacks who presents to the Emergency Department complaining of sudden-onset right-sided chest pain after coughing. Pt describes his cough as being productive in the mornings and nonproductive as the day continues. Has had 1-2 weeks of cough. He reports that as he was coughing today he heard a crack on his right side. Pain is sharp and worse with deep breathing or coughing. Pt denies any fever, chills or LE swelling.   Past Medical History:  Diagnosis Date  . Alcohol abuse   . Anxiety   . Depression   . Hyperlipemia   . Panic attacks   . Polysubstance abuse    crack, oxycodone, heroin, etoh    Patient Active Problem List   Diagnosis Date Noted  . MDD (major depressive disorder), recurrent severe, without psychosis (HCC) 08/02/2014  . Alcohol use disorder, severe, dependence (HCC) 08/02/2014  . Alcohol withdrawal (HCC) 08/02/2014  . 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 Cocaine use disorder, moderate, dependence (HCC) 08/02/2014    History reviewed. No pertinent surgical history.     Home Medications    Prior to Admission medications   Medication Sig Start Date End Date Taking? Authorizing Provider  Multiple Vitamins-Minerals (ECHINACEA ACZ PO) Take 1 tablet by mouth 2 (two) times daily.   Yes Historical Provider, MD  nicotine polacrilex (NICORETTE)  4 MG gum Take 4 mg by mouth as needed for smoking cessation.   Yes Historical Provider, MD  benzonatate (TESSALON) 100 MG capsule Take 1 capsule (100 mg total) by mouth 3 (three) times daily as needed for cough. 01/08/16   Loren Raceravid Jailen Coward, MD  hydrOXYzine (ATARAX/VISTARIL) 25 MG tablet Take 1 tablet (25 mg total) by mouth every 6 (six) hours as needed for anxiety. 01/08/16   Loren Raceravid Jaelin Fackler, MD  ibuprofen (ADVIL,MOTRIN) 600 MG tablet Take 1 tablet (600 mg total) by mouth every 6 (six) hours as needed. 01/08/16   Loren Raceravid Navin Dogan, MD  predniSONE (DELTASONE) 20 MG tablet 3 tabs po day one, then 2 po daily x 4 days 01/08/16   Loren Raceravid Oval Moralez, MD    Family History Family History  Problem Relation Age of Onset  . Diabetes Other   . Hypertension Other   . Cancer Other   . Heart failure Other   . Alcohol abuse Father   . Depression Brother   . Bipolar disorder Brother     Social History Social History  Substance Use Topics  . Smoking status: Current Some Day Smoker    Packs/day: 1.00    Types: Cigarettes  . Smokeless tobacco: Never Used  . Alcohol use No     Comment: no alcohol 7 months     Allergies   Patient has no known allergies.   Review of Systems Review of Systems  Constitutional: Negative for chills and fever.  HENT: Negative for congestion, sinus pressure and sore throat.   Respiratory:  Positive for cough. Negative for shortness of breath and wheezing.   Cardiovascular: Positive for chest pain. Negative for palpitations and leg swelling.  Gastrointestinal: Negative for abdominal pain, diarrhea, nausea and vomiting.  Musculoskeletal: Negative for back pain and neck pain.  Skin: Negative for rash and wound.  Neurological: Negative for dizziness, weakness, numbness and headaches.  All other systems reviewed and are negative.    Physical Exam Updated Vital Signs BP 130/75   Pulse 73   Temp 99.1 F (37.3 C) (Oral)   Resp 16   Ht 6' (1.829 m)   Wt 280 lb (127 kg)    SpO2 98%   BMI 37.97 kg/m   Physical Exam  Constitutional: He is oriented to person, place, and time. He appears well-developed and well-nourished. No distress.  HENT:  Head: Normocephalic and atraumatic.  Mouth/Throat: Oropharynx is clear and moist. No oropharyngeal exudate.  Eyes: EOM are normal. Pupils are equal, round, and reactive to light.  Neck: Normal range of motion. Neck supple.  Cardiovascular: Normal rate and regular rhythm.   Pulmonary/Chest: Effort normal. He has wheezes (expiratory wheezing throughout). He exhibits tenderness (patient with right lateral inferior chest wall tenderness to palpation. No crepitance or deformity.).  Abdominal: Soft. Bowel sounds are normal. There is no tenderness. There is no rebound and no guarding.  Musculoskeletal: Normal range of motion. He exhibits no edema or tenderness.  No lower extremity swelling, asymmetry or tenderness.  Neurological: He is alert and oriented to person, place, and time.  Moves all extremities without deficit. Sensation is fully intact.  Skin: Skin is warm and dry. Capillary refill takes less than 2 seconds. No rash noted. No erythema.  Psychiatric: He has a normal mood and affect. His behavior is normal.  Nursing note and vitals reviewed.    ED Treatments / Results  DIAGNOSTIC STUDIES:  Oxygen Saturation is 97% on RA, normal by my interpretation.    COORDINATION OF CARE:  9:41 AM Discussed treatment plan with pt at bedside which includes a breathing treatment and pt agreed to plan.  Labs (all labs ordered are listed, but only abnormal results are displayed) Labs Reviewed - No data to display  EKG  EKG Interpretation None       Radiology Dg Ribs Unilateral W/chest Right  Result Date: 01/08/2016 CLINICAL DATA:  Cough and chest pain on the right side, initial encounter EXAM: RIGHT RIBS AND CHEST - 3+ VIEW COMPARISON:  None. FINDINGS: Cardiac shadow is within normal limits. The lungs are clear  bilaterally. No pneumothorax is seen. No rib fractures identified. No underlying pneumothorax is seen. IMPRESSION: No acute abnormality noted. Electronically Signed   By: Alcide Clever M.D.   On: 01/08/2016 10:29    Procedures Procedures (including critical care time)  Medications Ordered in ED Medications  ALPRAZolam Prudy Feeler) tablet 0.5 mg (0.5 mg Oral Given 01/08/16 0959)  ketorolac (TORADOL) injection 60 mg (60 mg Intramuscular Given 01/08/16 1000)  levalbuterol (XOPENEX) nebulizer solution 1.25 mg (1.25 mg Nebulization Given 01/08/16 1002)     Initial Impression / Assessment and Plan / ED Course  I have reviewed the triage vital signs and the nursing notes.  Pertinent labs & imaging results that were available during my care of the patient were reviewed by me and considered in my medical decision making (see chart for details).  Clinical Course    X-ray without acute findings. Improved air movement after breathing treatment. We'll give short course of steroids and given MDI in the emergency  department. Return precautions given.   Final Clinical Impressions(s) / ED Diagnoses   Final diagnoses:  Right-sided chest wall pain  Bronchospasm    New Prescriptions Discharge Medication List as of 01/08/2016 11:25 AM    START taking these medications   Details  benzonatate (TESSALON) 100 MG capsule Take 1 capsule (100 mg total) by mouth 3 (three) times daily as needed for cough., Starting Sun 01/08/2016, Print    hydrOXYzine (ATARAX/VISTARIL) 25 MG tablet Take 1 tablet (25 mg total) by mouth every 6 (six) hours as needed for anxiety., Starting Sun 01/08/2016, Print    ibuprofen (ADVIL,MOTRIN) 600 MG tablet Take 1 tablet (600 mg total) by mouth every 6 (six) hours as needed., Starting Sun 01/08/2016, Print    predniSONE (DELTASONE) 20 MG tablet 3 tabs po day one, then 2 po daily x 4 days, Print      I personally performed the services described in this documentation, which was scribed in  my presence. The recorded information has been reviewed and is accurate.      Loren Raceravid Emmanuel Ercole, MD 01/09/16 1530

## 2016-01-23 ENCOUNTER — Encounter (HOSPITAL_COMMUNITY): Payer: Self-pay | Admitting: Emergency Medicine

## 2016-01-23 ENCOUNTER — Emergency Department (HOSPITAL_COMMUNITY)
Admission: EM | Admit: 2016-01-23 | Discharge: 2016-01-24 | Disposition: A | Discharge status: Home / Self Care | Payer: Federal, State, Local not specified - PPO | Attending: Emergency Medicine | Admitting: Emergency Medicine

## 2016-01-23 DIAGNOSIS — Z79899 Other long term (current) drug therapy: Secondary | ICD-10-CM | POA: Insufficient documentation

## 2016-01-23 DIAGNOSIS — F410 Panic disorder [episodic paroxysmal anxiety] without agoraphobia: Secondary | ICD-10-CM

## 2016-01-23 DIAGNOSIS — F1910 Other psychoactive substance abuse, uncomplicated: Secondary | ICD-10-CM | POA: Insufficient documentation

## 2016-01-23 DIAGNOSIS — E876 Hypokalemia: Secondary | ICD-10-CM | POA: Diagnosis not present

## 2016-01-23 DIAGNOSIS — F17210 Nicotine dependence, cigarettes, uncomplicated: Secondary | ICD-10-CM | POA: Insufficient documentation

## 2016-01-23 NOTE — ED Triage Notes (Signed)
Pt states he snorted what he thought was heroin x ago which brought on panic attack. Pt states he took 0.5mg  of xanax before coming to hospital.

## 2016-01-24 LAB — URINALYSIS, ROUTINE W REFLEX MICROSCOPIC
BILIRUBIN URINE: NEGATIVE
GLUCOSE, UA: NEGATIVE mg/dL
KETONES UR: NEGATIVE mg/dL
Leukocytes, UA: NEGATIVE
NITRITE: NEGATIVE
PH: 6.5 (ref 5.0–8.0)
Protein, ur: NEGATIVE mg/dL
Specific Gravity, Urine: <1.005 — ABNORMAL LOW (ref 1.005–1.030)

## 2016-01-24 LAB — RAPID URINE DRUG SCREEN, HOSP PERFORMED
Amphetamines: NOT DETECTED
BARBITURATES: NOT DETECTED
Benzodiazepines: NOT DETECTED
Cocaine: NOT DETECTED
Opiates: NOT DETECTED
Tetrahydrocannabinol: NOT DETECTED

## 2016-01-24 LAB — BASIC METABOLIC PANEL
Anion gap: 10 (ref 5–15)
BUN: 12 mg/dL (ref 6–20)
CALCIUM: 9.3 mg/dL (ref 8.9–10.3)
CO2: 22 mmol/L (ref 22–32)
CREATININE: 0.93 mg/dL (ref 0.61–1.24)
Chloride: 103 mmol/L (ref 101–111)
GFR calc non Af Amer: >60 mL/min (ref >60)
GLUCOSE: 90 mg/dL (ref 65–99)
Potassium: 3 mmol/L — ABNORMAL LOW (ref 3.5–5.1)
Sodium: 135 mmol/L (ref 135–145)

## 2016-01-24 LAB — ETHANOL

## 2016-01-24 LAB — URINE MICROSCOPIC-ADD ON
Bacteria, UA: NONE SEEN
SQUAMOUS EPITHELIAL / LPF: NONE SEEN
WBC UA: NONE SEEN WBC/hpf (ref 0–5)

## 2016-01-24 MED ORDER — POTASSIUM CHLORIDE CRYS ER 20 MEQ PO TBCR
40.0000 meq | EXTENDED_RELEASE_TABLET | Freq: Once | ORAL | Status: AC
  Administered 2016-01-24: 40 meq via ORAL
  Filled 2016-01-24: qty 2

## 2016-01-24 MED ORDER — LORAZEPAM 1 MG PO TABS
1.0000 mg | ORAL_TABLET | Freq: Once | ORAL | Status: AC
  Administered 2016-01-24: 1 mg via ORAL
  Filled 2016-01-24: qty 1

## 2016-01-24 MED ORDER — LORAZEPAM 0.5 MG PO TABS: 0.5000 mg | ORAL_TABLET | Freq: Three times a day (TID) | ORAL | 0 refills | Status: DC | PRN

## 2016-01-24 NOTE — Discharge Instructions (Signed)
Please see the physicians at [facility], or outpatient addiction treatment services of [place]. Use 1 Ativan tablet up to 3 times daily if needed for panic attacks.

## 2016-01-24 NOTE — ED Provider Notes (Signed)
[facility]-EMERG[facility] DEPT Provider Note   CSN: [id number] Arrival date & time: 01/23/16  2340     History   Chief Complaint Chief Complaint  Patient presents with  . Anxiety    HPI Jeffery Hancock is a 25 y.o. male.  Patient is a 25 year old male who presents to the emergency department with a complaint of anxiety and relapsed to drugs.  The patient states that he has a history of anxiety and panic attacks. He had been on on Xanax. He states that his physician to come off of the Xanax because he did not want him to get addicted to it. The patient states that since he's been off the Xanax he has been having bouts of panic attacks, and he uses drugs to help him with these. On yesterday he used cocaine and alcohol. Today he states that he had herion. He states however that assist the first time that he has done this in 6 months. He has been told by his physician to see a psychiatrist, but he has not made the appointment yet. Patient states that the drugs help him deal with his panic attacks. Tonight the patient felt that his anxiety was heroin related he took Xanax instead he felt a little better but he was still having problems with elevated heart rate and came to the emergency department for evaluation. He denies suicidal or homicidal ideations.    Anxiety  Pertinent negatives include no chest pain, no abdominal pain and no shortness of breath.    Past Medical History:  Diagnosis Date  . Alcohol abuse   . Anxiety   . Depression   . Hyperlipemia   . Panic attacks   . Polysubstance abuse    crack, oxycodone, heroin, etoh    Patient Active Problem List   Diagnosis Date Noted  . MDD (major depressive disorder), recurrent severe, without psychosis (HCC) 08/02/2014  . Alcohol use disorder, severe, dependence (HCC) 08/02/2014  . Alcohol withdrawal (HCC) 08/02/2014  . 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 Cocaine use disorder, moderate, dependence (HCC) 08/02/2014    History reviewed. No pertinent surgical history.     Home Medications    Prior to Admission medications   Medication Sig Start Date End Date Taking? Authorizing Provider  benzonatate (TESSALON) 100 MG capsule Take 1 capsule (100 mg total) by mouth 3 (three) times daily as needed for cough. 01/08/16   Loren Racer, MD  hydrOXYzine (ATARAX/VISTARIL) 25 MG tablet Take 1 tablet (25 mg total) by mouth every 6 (six) hours as needed for anxiety. 01/08/16   Loren Racer, MD  ibuprofen (ADVIL,MOTRIN) 600 MG tablet Take 1 tablet (600 mg total) by mouth every 6 (six) hours as needed. 01/08/16   Loren Racer, MD  Multiple Vitamins-Minerals (ECHINACEA ACZ PO) Take 1 tablet by mouth 2 (two) times daily.    Historical Provider, MD  nicotine polacrilex (NICORETTE) 4 MG gum Take 4 mg by mouth as needed for smoking cessation.    Historical Provider, MD  predniSONE (DELTASONE) 20 MG tablet 3 tabs po day one, then 2 po daily x 4 days 01/08/16   Loren Racer, MD    Family History Family History  Problem Relation Age of Onset  . Diabetes Other   . Hypertension Other   . Cancer Other   . Heart failure Other   . Alcohol abuse Father   . Depression Brother   . Bipolar disorder Brother  Social History Social History  Substance Use Topics  . Smoking status: Current Some Day Smoker    Packs/day: 1.00    Types: Cigarettes  . Smokeless tobacco: Never Used  . Alcohol use No     Comment: no alcohol 7 months     Allergies   Patient has no known allergies.   Review of Systems Review of Systems  Constitutional: Negative for activity change.       All ROS Neg except as noted in HPI  HENT: Negative for nosebleeds.   Eyes: Negative for photophobia and discharge.  Respiratory: Negative for cough, shortness of breath and wheezing.   Cardiovascular: Negative for chest pain and palpitations.  Gastrointestinal: Negative for  abdominal pain and blood in stool.  Genitourinary: Negative for dysuria, frequency and hematuria.  Musculoskeletal: Negative for arthralgias, back pain and neck pain.  Skin: Negative.   Neurological: Negative for dizziness, seizures and speech difficulty.  Psychiatric/Behavioral: Negative for confusion, hallucinations and suicidal ideas. The patient is nervous/anxious.      Physical Exam Updated Vital Signs BP (!) 157/103   Pulse (!) 128   Temp 97.7 F (36.5 C)   Resp (!) 27   Ht 6' (1.829 m)   Wt 127 kg   SpO2 99%   BMI 37.97 kg/m   Physical Exam  Constitutional: He is oriented to person, place, and time. He appears well-developed and well-nourished.  Non-toxic appearance.  HENT:  Head: Normocephalic.  Right Ear: Tympanic membrane and external ear normal.  Left Ear: Tympanic membrane and external ear normal.  Eyes: EOM and lids are normal. Pupils are equal, round, and reactive to light.  Neck: Normal range of motion. Neck supple. Carotid bruit is not present.  Cardiovascular: Regular rhythm, normal heart sounds, intact distal pulses and normal pulses.  Tachycardia present.   Pulmonary/Chest: Breath sounds normal. No respiratory distress.  Abdominal: Soft. Bowel sounds are normal. There is no tenderness. There is no guarding.  Musculoskeletal: Normal range of motion.  Lymphadenopathy:       Head (right side): No submandibular adenopathy present.       Head (left side): No submandibular adenopathy present.    He has no cervical adenopathy.  Neurological: He is alert and oriented to person, place, and time. He has normal strength. No cranial nerve deficit or sensory deficit.  Skin: Skin is warm and dry.  Psychiatric: His speech is normal and behavior is normal. His mood appears anxious. He is not actively hallucinating. He expresses no homicidal and no suicidal ideation. He expresses no suicidal plans and no homicidal plans. He exhibits normal recent memory and normal remote  memory.  Nursing note and vitals reviewed.    ED Treatments / Results  Labs (all labs ordered are listed, but only abnormal results are displayed) Labs Reviewed  BASIC METABOLIC PANEL  URINALYSIS, ROUTINE W REFLEX MICROSCOPIC (NOT AT [facility])  RAPID URINE DRUG SCREEN, HOSP PERFORMED  ETHANOL    EKG  EKG Interpretation  Date/Time:  Monday January 23 2016 23:59:18 EST Ventricular Rate:  129 PR Interval:    QRS Duration: 90 QT Interval:  319 QTC Calculation: 468 R Axis:   71 Text Interpretation:  Sinus tachycardia Consider right atrial enlargement Probable inferior infarct, old Baseline wander in lead(s) I III aVL V1 V2 Confirmed by HORTON  MD, COURTNEY ([id number]) on 01/24/2016 12:13:51 AM       Radiology No results found.  Procedures Procedures (including critical care time)  Medications Ordered in ED Medications  LORazepam (ATIVAN) tablet 1 mg (1 mg Oral Given 01/24/16 0018)     Initial Impression / Assessment and Plan / ED Course  I have reviewed the triage vital signs and the nursing notes.  Pertinent labs & imaging results that were available during my care of the patient were reviewed by me and considered in my medical decision making (see chart for details).  Clinical Course     *I have reviewed nursing notes, vital signs, and all appropriate lab and imaging results for this patient.**  Final Clinical Impressions(s) / ED Diagnoses  The initial blood pressure is elevated 157/103, and the initial heart rate is elevated at 128. Pulse oximetry is 99% on room air.  Potassium is low at 3.0. Potassium given to the patient. The rapid urine screen is negative. Ethanol is negative. The electrocardiogram was negative for acute event.  The patient is [facility] to [facility], and the direction and rehabilitation services of [place] for outpatient care and therapy. Patient invited to return to the emergency department if any emergent changes, problems, or  concerns. Prescription for Ativan 0.5 mg 3 times a day as needed given to the patient.    Final diagnoses:  Panic attack  Polysubstance abuse  Hypokalemia    New Prescriptions New Prescriptions   No medications on file     Ivery QualeHobson Joncarlos Atkison, PA-C 01/24/16 0129    Shon Batonourtney F Horton, MD 01/24/16 [phone]

## 2016-03-16 ENCOUNTER — Encounter (HOSPITAL_COMMUNITY): Payer: Self-pay | Admitting: Emergency Medicine

## 2016-03-16 ENCOUNTER — Emergency Department (HOSPITAL_COMMUNITY)
Admission: EM | Admit: 2016-03-16 | Discharge: 2016-03-16 | Disposition: A | Discharge status: Home / Self Care | Payer: Federal, State, Local not specified - PPO | Attending: Emergency Medicine | Admitting: Emergency Medicine

## 2016-03-16 DIAGNOSIS — F17210 Nicotine dependence, cigarettes, uncomplicated: Secondary | ICD-10-CM | POA: Insufficient documentation

## 2016-03-16 DIAGNOSIS — Y929 Unspecified place or not applicable: Secondary | ICD-10-CM | POA: Diagnosis not present

## 2016-03-16 DIAGNOSIS — Y939 Activity, unspecified: Secondary | ICD-10-CM | POA: Diagnosis not present

## 2016-03-16 DIAGNOSIS — W57XXXA Bitten or stung by nonvenomous insect and other nonvenomous arthropods, initial encounter: Secondary | ICD-10-CM | POA: Insufficient documentation

## 2016-03-16 DIAGNOSIS — Y999 Unspecified external cause status: Secondary | ICD-10-CM | POA: Insufficient documentation

## 2016-03-16 DIAGNOSIS — S50862A Insect bite (nonvenomous) of left forearm, initial encounter: Secondary | ICD-10-CM | POA: Insufficient documentation

## 2016-03-16 DIAGNOSIS — Z79899 Other long term (current) drug therapy: Secondary | ICD-10-CM | POA: Insufficient documentation

## 2016-03-16 MED ORDER — DOXYCYCLINE HYCLATE 100 MG PO CAPS: 100.0000 mg | ORAL_CAPSULE | Freq: Two times a day (BID) | ORAL | 0 refills | Status: DC

## 2016-03-16 NOTE — Discharge Instructions (Signed)
Your vital signs within normal limits. Please use warm compresses 2 or 3 times a day, or warm tub soak to the arm daily. Use doxycycline 2 times daily with food until all taken. Please see your primary physician, or return to the emergency department if any signs of advancing infection.

## 2016-03-16 NOTE — ED Provider Notes (Signed)
[facility] DEPT Provider Note   CSN: [id number] Arrival date & time: 03/16/16  1011     History   Chief Complaint Chief Complaint  Patient presents with  . Insect Bite    HPI Jeffery Hancock is a 26 y.o. male.  Patient is a 26 year old male who presents to the emergency department with a complaint of insect bite to the left arm.  The patient states that on Saturday, January 6 he was in a crawl space. He felt something bite him on his arm. The next day he noticed a reddened area. Day following that the area continued to get larger and more firm. The patient states he is concerned because it seems as though the area continues to get more firm each day and he is concerned for infection. He has not had chills, he's not measured an elevation in his temperature. He has not had drainage from the area. He has not noted red streaking. He is not diabetic, and does not have any medical issues that would cause insult to his immune system.   The history is provided by the patient.    Past Medical History:  Diagnosis Date  . Alcohol abuse   . Anxiety   . Depression   . Hyperlipemia   . Panic attacks   . Polysubstance abuse    crack, oxycodone, heroin, etoh    Patient Active Problem List   Diagnosis Date Noted  . MDD (major depressive disorder), recurrent severe, without psychosis (HCC) 08/02/2014  . Alcohol use disorder, severe, dependence (HCC) 08/02/2014  . Alcohol withdrawal (HCC) 08/02/2014  . 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 Cocaine use disorder, moderate, dependence (HCC) 08/02/2014    History reviewed. No pertinent surgical history.     Home Medications    Prior to Admission medications   Medication Sig Start Date End Date Taking? Authorizing Provider  acetaminophen (TYLENOL) 500 MG tablet Take 1,000 mg by mouth every 6 (six) hours as needed for moderate pain.   Yes Historical  Provider, MD  albuterol (PROVENTIL HFA;VENTOLIN HFA) 108 (90 Base) MCG/ACT inhaler Inhale 1-2 puffs into the lungs every 6 (six) hours as needed for wheezing or shortness of breath.   Yes Historical Provider, MD  LORazepam (ATIVAN) 0.5 MG tablet Take 1 tablet (0.5 mg total) by mouth 3 (three) times daily as needed for anxiety. Patient not taking: Reported on 03/16/2016 01/24/16   Ivery Quale, PA-C    Family History Family History  Problem Relation Age of Onset  . Diabetes Other   . Hypertension Other   . Cancer Other   . Heart failure Other   . Alcohol abuse Father   . Depression Brother   . Bipolar disorder Brother     Social History Social History  Substance Use Topics  . Smoking status: Current Some Day Smoker    Packs/day: 1.00    Types: Cigarettes  . Smokeless tobacco: Never Used  . Alcohol use No     Comment: no alcohol 7 months     Allergies   Patient has no known allergies.   Review of Systems Review of Systems  Constitutional: Negative for activity change.       All ROS Neg except as noted in HPI  HENT: Negative for nosebleeds.   Eyes: Negative for photophobia and discharge.  Respiratory: Negative for cough, shortness of breath and wheezing.   Cardiovascular: Negative for chest pain and palpitations.  Gastrointestinal:  Negative for abdominal pain and blood in stool.  Genitourinary: Negative for dysuria, frequency and hematuria.  Musculoskeletal: Negative for arthralgias, back pain and neck pain.  Skin: Positive for wound.  Neurological: Negative for dizziness, seizures and speech difficulty.  Psychiatric/Behavioral: Negative for confusion and hallucinations. The patient is nervous/anxious.      Physical Exam Updated Vital Signs BP 131/84 (BP Location: Left Arm)   Pulse 89   Temp 98.7 F (37.1 C) (Oral)   Resp 18   Ht 6' (1.829 m)   Wt 127 kg   SpO2 99%   BMI 37.97 kg/m   Physical Exam  Constitutional: He is oriented to person, place, and  time. He appears well-developed and well-nourished.  Non-toxic appearance.  HENT:  Head: Normocephalic.  Right Ear: Tympanic membrane and external ear normal.  Left Ear: Tympanic membrane and external ear normal.  Eyes: EOM and lids are normal. Pupils are equal, round, and reactive to light.  Neck: Normal range of motion. Neck supple. Carotid bruit is not present.  Cardiovascular: Normal rate, regular rhythm, normal heart sounds, intact distal pulses and normal pulses.   Pulmonary/Chest: Breath sounds normal. No respiratory distress.  Abdominal: Soft. Bowel sounds are normal. There is no tenderness. There is no guarding.  Musculoskeletal: Normal range of motion.       Arms: There is full range of motion of the left shoulder, elbow, wrist, and fingers. The patient can pronate and supinate without any problem whatsoever.  Lymphadenopathy:       Head (right side): No submandibular adenopathy present.       Head (left side): No submandibular adenopathy present.    He has no cervical adenopathy.  Neurological: He is alert and oriented to person, place, and time. He has normal strength. No cranial nerve deficit or sensory deficit.  Skin: Skin is warm and dry.  Psychiatric: He has a normal mood and affect. His speech is normal.  Nursing note and vitals reviewed.    ED Treatments / Results  Labs (all labs ordered are listed, but only abnormal results are displayed) Labs Reviewed - No data to display  EKG  EKG Interpretation None       Radiology No results found.  Procedures Procedures (including critical care time)  Medications Ordered in ED Medications - No data to display   Initial Impression / Assessment and Plan / ED Course  I have reviewed the triage vital signs and the nursing notes.  Pertinent labs & imaging results that were available during my care of the patient were reviewed by me and considered in my medical decision making (see chart for details).  Clinical  Course     **I have reviewed nursing notes, vital signs, and all appropriate lab and imaging results for this patient.*  Final Clinical Impressions(s) / ED Diagnoses  Vital signs within normal limits. The patient has what is thought to be an insect bite to the palmar surface of the left forearm. There is a slight red raised area there. There is no red streaks or drainage appreciated.  The patient is asked to use warm compresses to the forearm to 3 times daily. He will be treated with doxycycline 2 times daily. He is to see his primary physician, or return to the emergency department if any signs of advancing infection or other concerns.    Final diagnoses:  None    New Prescriptions New Prescriptions   No medications on file     Ivery Quale, PA-C 03/16/16  1109    Bethann BerkshireJoseph Zammit, MD 03/16/16 1547

## 2016-03-16 NOTE — ED Triage Notes (Signed)
Patient states he was in a crawl space last Saturday and noticed a place on left forearm. Pt states place is hard and red and has gotten larger since Saturday. NAD.

## 2016-06-28 ENCOUNTER — Emergency Department (HOSPITAL_COMMUNITY): Payer: Federal, State, Local not specified - PPO

## 2016-06-28 ENCOUNTER — Emergency Department (HOSPITAL_COMMUNITY)
Admission: EM | Admit: 2016-06-28 | Discharge: 2016-06-28 | Disposition: A | Discharge status: Home / Self Care | Payer: Federal, State, Local not specified - PPO | Attending: Emergency Medicine | Admitting: Emergency Medicine

## 2016-06-28 ENCOUNTER — Encounter (HOSPITAL_COMMUNITY): Payer: Self-pay | Admitting: Emergency Medicine

## 2016-06-28 DIAGNOSIS — L02413 Cutaneous abscess of right upper limb: Secondary | ICD-10-CM | POA: Insufficient documentation

## 2016-06-28 DIAGNOSIS — Z791 Long term (current) use of non-steroidal anti-inflammatories (NSAID): Secondary | ICD-10-CM | POA: Insufficient documentation

## 2016-06-28 DIAGNOSIS — F17210 Nicotine dependence, cigarettes, uncomplicated: Secondary | ICD-10-CM | POA: Insufficient documentation

## 2016-06-28 DIAGNOSIS — L02511 Cutaneous abscess of right hand: Secondary | ICD-10-CM

## 2016-06-28 DIAGNOSIS — Z79899 Other long term (current) drug therapy: Secondary | ICD-10-CM | POA: Insufficient documentation

## 2016-06-28 LAB — COMPREHENSIVE METABOLIC PANEL
ALBUMIN: 4.6 g/dL (ref 3.5–5.0)
ALK PHOS: 89 U/L (ref 38–126)
ALT: 99 U/L — AB (ref 17–63)
ANION GAP: 12 (ref 5–15)
AST: 55 U/L — ABNORMAL HIGH (ref 15–41)
BUN: 13 mg/dL (ref 6–20)
CALCIUM: 9.7 mg/dL (ref 8.9–10.3)
CHLORIDE: 100 mmol/L — AB (ref 101–111)
CO2: 24 mmol/L (ref 22–32)
CREATININE: 1.06 mg/dL (ref 0.61–1.24)
GFR calc Af Amer: >60 mL/min (ref >60)
GFR calc non Af Amer: >60 mL/min (ref >60)
GLUCOSE: 90 mg/dL (ref 65–99)
Potassium: 4.1 mmol/L (ref 3.5–5.1)
SODIUM: 136 mmol/L (ref 135–145)
Total Bilirubin: 1.1 mg/dL (ref 0.3–1.2)
Total Protein: 8.8 g/dL — ABNORMAL HIGH (ref 6.5–8.1)

## 2016-06-28 LAB — CBC WITH DIFFERENTIAL/PLATELET
BASOS PCT: 0 %
Basophils Absolute: 0 10*3/uL (ref 0.0–0.1)
EOS ABS: 0 10*3/uL (ref 0.0–0.7)
Eosinophils Relative: 0 %
HCT: 51.4 % (ref 39.0–52.0)
Hemoglobin: 18.4 g/dL — ABNORMAL HIGH (ref 13.0–17.0)
Lymphocytes Relative: 19 %
Lymphs Abs: 1.9 10*3/uL (ref 0.7–4.0)
MCH: 31.5 pg (ref 26.0–34.0)
MCHC: 35.8 g/dL (ref 30.0–36.0)
MCV: 87.9 fL (ref 78.0–100.0)
Monocytes Absolute: 1.1 10*3/uL — ABNORMAL HIGH (ref 0.1–1.0)
Monocytes Relative: 11 %
NEUTROS PCT: 70 %
Neutro Abs: 6.7 10*3/uL (ref 1.7–7.7)
PLATELETS: 284 10*3/uL (ref 150–400)
RBC: 5.85 MIL/uL — AB (ref 4.22–5.81)
RDW: 13.4 % (ref 11.5–15.5)
WBC: 9.7 10*3/uL (ref 4.0–10.5)

## 2016-06-28 LAB — LACTIC ACID, PLASMA: Lactic Acid, Venous: 1 mmol/L (ref 0.5–1.9)

## 2016-06-28 MED ORDER — DOXYCYCLINE HYCLATE 100 MG PO CAPS: 100.0000 mg | ORAL_CAPSULE | Freq: Two times a day (BID) | ORAL | 0 refills | Status: DC

## 2016-06-28 MED ORDER — DOXYCYCLINE HYCLATE 100 MG PO TABS
100.0000 mg | ORAL_TABLET | Freq: Once | ORAL | Status: AC
  Administered 2016-06-28: 100 mg via ORAL
  Filled 2016-06-28: qty 1

## 2016-06-28 MED ORDER — LIDOCAINE-EPINEPHRINE (PF) 2 %-1:200000 IJ SOLN
20.0000 mL | Freq: Once | INTRAMUSCULAR | Status: AC
  Administered 2016-06-28: 20 mL
  Filled 2016-06-28: qty 20

## 2016-06-28 NOTE — ED Notes (Signed)
Pt alert & oriented x4, stable gait. Patient given discharge instructions, paperwork & prescription(s). Patient  instructed to stop at the registration desk to finish any additional paperwork. Patient verbalized understanding. Pt left department w/ no further questions. 

## 2016-06-28 NOTE — ED Triage Notes (Signed)
Pt reports abscess to right hand.

## 2016-06-28 NOTE — Discharge Instructions (Signed)
Soak in warm salt water twice a day for the next 2 days. Prescription for antibiotic for 5 days. Tylenol or Motrin for pain.

## 2016-06-29 NOTE — ED Provider Notes (Signed)
[facility] Provider Note   CSN: [id number] Arrival date & time: 06/28/16  1637     History   Chief Complaint Chief Complaint  Patient presents with  . Abscess    HPI Jeffery Hancock is a 26 y.o. male.  Patient presents with abscess on the dorsum of his right hand for 2-3 days. He thinks he accidentally bumped it at work. No fever, sweats, chills. Severity of symptoms moderate.      Past Medical History:  Diagnosis Date  . Alcohol abuse   . Anxiety   . Depression   . Hyperlipemia   . Panic attacks   . Polysubstance abuse    crack, oxycodone, heroin, etoh    Patient Active Problem List   Diagnosis Date Noted  . MDD (major depressive disorder), recurrent severe, without psychosis (HCC) 08/02/2014  . Alcohol use disorder, severe, dependence (HCC) 08/02/2014  . Alcohol withdrawal (HCC) 08/02/2014  . 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 Cocaine use disorder, moderate, dependence (HCC) 08/02/2014    History reviewed. No pertinent surgical history.     Home Medications    Prior to Admission medications   Medication Sig Start Date End Date Taking? Authorizing Provider  acetaminophen (TYLENOL) 500 MG tablet Take 1,000 mg by mouth every 6 (six) hours as needed for moderate pain.   Yes Historical Provider, MD  albuterol (PROVENTIL HFA;VENTOLIN HFA) 108 (90 Base) MCG/ACT inhaler Inhale 1-2 puffs into the lungs every 6 (six) hours as needed for wheezing or shortness of breath.   Yes Historical Provider, MD  ibuprofen (ADVIL,MOTRIN) 400 MG tablet Take 400 mg by mouth every 6 (six) hours as needed.   Yes Historical Provider, MD  doxycycline (VIBRAMYCIN) 100 MG capsule Take 1 capsule (100 mg total) by mouth 2 (two) times daily. 06/28/16   Donnetta Hutching, MD  LORazepam (ATIVAN) 0.5 MG tablet Take 1 tablet (0.5 mg total) by mouth 3 (three) times daily as needed for anxiety. Patient not  taking: Reported on 03/16/2016 01/24/16   Ivery Quale, PA-C    Family History Family History  Problem Relation Age of Onset  . Diabetes Other   . Hypertension Other   . Cancer Other   . Heart failure Other   . Alcohol abuse Father   . Depression Brother   . Bipolar disorder Brother     Social History Social History  Substance Use Topics  . Smoking status: Current Some Day Smoker    Packs/day: 1.00    Types: Cigarettes  . Smokeless tobacco: Never Used  . Alcohol use No     Comment: no alcohol 7 months     Allergies   Patient has no known allergies.   Review of Systems Review of Systems  All other systems reviewed and are negative.    Physical Exam Updated Vital Signs BP (!) 156/85 (BP Location: Right Arm)   Pulse 84   Temp 98.7 F (37.1 C) (Oral)   Resp 20   Ht 6' (1.829 m)   Wt 280 lb (127 kg)   SpO2 98%   BMI 37.97 kg/m   Physical Exam  Constitutional: He is oriented to person, place, and time. He appears well-developed and well-nourished.  HENT:  Head: Normocephalic and atraumatic.  Eyes: Conjunctivae are normal.  Neck: Neck supple.  Musculoskeletal: Normal range of motion.  Neurological: He is alert and oriented to person, place, and time.  Skin:  Dorsum of right hand:  1.5 x 1.5 cm abscess  Psychiatric: He has a normal mood and affect. His behavior is normal.  Nursing note and vitals reviewed.    ED Treatments / Results  Labs (all labs ordered are listed, but only abnormal results are displayed) Labs Reviewed  CBC WITH DIFFERENTIAL/PLATELET - Abnormal; Notable for the following:       Result Value   RBC 5.85 (*)    Hemoglobin 18.4 (*)    Monocytes Absolute 1.1 (*)    All other components within normal limits  COMPREHENSIVE METABOLIC PANEL - Abnormal; Notable for the following:    Chloride 100 (*)    Total Protein 8.8 (*)    AST 55 (*)    ALT 99 (*)    All other components within normal limits  LACTIC ACID, PLASMA    EKG  EKG  Interpretation None       Radiology Dg Hand Complete Right  Result Date: 06/28/2016 CLINICAL DATA:  26 year old presenting with 3-4 day history of edema, erythema and palpable mass involving the right hand between the heads of the second and third metacarpals. No known injuries. EXAM: RIGHT HAND - COMPLETE 3+ VIEW COMPARISON:  None. FINDINGS: Soft tissue swelling corresponding to the area of concern involving the dorsum of the hand. No evidence of acute fracture or dislocation. No evidence of underlying osteomyelitis. Well-preserved bone mineral density. Slight dorsal tilt to the lunate on the lateral image, though this may be positional. IMPRESSION: No acute osseous abnormality. Electronically Signed   By: Hulan Saas M.D.   On: 06/28/2016 17:32    Procedures .Marland KitchenIncision and Drainage Date/Time: 06/28/2016 8:00 PM Performed by: Donnetta Hutching Authorized by: Donnetta Hutching   Consent:    Consent obtained:  Verbal   Consent given by:  Patient   Risks discussed:  Pain Location:    Type:  Abscess   Size:  1.5 by 1.5 cm   Location:  Upper extremity   Upper extremity location:  Hand   Hand location:  R hand Pre-procedure details:    Skin preparation:  Betadine Anesthesia (see MAR for exact dosages):    Anesthesia method:  Local infiltration   Local anesthetic:  Lidocaine 2% WITH epi Procedure type:    Complexity:  Complex Procedure details:    Needle aspiration: no     Incision types:  Stab incision   Incision depth:  Dermal   Scalpel blade:  11   Wound management:  Probed and deloculated and irrigated with saline   Drainage:  Purulent   Drainage amount:  Copious   Wound treatment:  Wound left open   Packing materials:  None Post-procedure details:    Patient tolerance of procedure:  Tolerated well, no immediate complications   (including critical care time)  Medications Ordered in ED Medications  lidocaine-EPINEPHrine (XYLOCAINE W/EPI) 2 %-1:200000 (PF) injection 20 mL (20  mLs Infiltration Given by Other 06/28/16 2116)  doxycycline (VIBRA-TABS) tablet 100 mg (100 mg Oral Given 06/28/16 2116)     Initial Impression / Assessment and Plan / ED Course  I have reviewed the triage vital signs and the nursing notes.  Pertinent labs & imaging results that were available during my care of the patient were reviewed by me and considered in my medical decision making (see chart for details).     Status post incision and drainage of abscess on right hand. Patient tolerated procedure well. Discharge medications doxycycline.  Final Clinical Impressions(s) / ED Diagnoses   Final diagnoses:  Abscess  of right hand    New Prescriptions Discharge Medication List as of 06/28/2016  9:18 PM       Donnetta Hutching, MD 06/29/16 2105

## 2016-08-08 ENCOUNTER — Emergency Department (HOSPITAL_COMMUNITY)
Admission: EM | Admit: 2016-08-08 | Discharge: 2016-08-08 | Disposition: A | Discharge status: Home / Self Care | Payer: Self-pay | Attending: Emergency Medicine | Admitting: Emergency Medicine

## 2016-08-08 ENCOUNTER — Encounter (HOSPITAL_COMMUNITY): Payer: Self-pay | Admitting: *Deleted

## 2016-08-08 DIAGNOSIS — R05 Cough: Secondary | ICD-10-CM | POA: Insufficient documentation

## 2016-08-08 DIAGNOSIS — R000 Tachycardia, unspecified: Secondary | ICD-10-CM | POA: Insufficient documentation

## 2016-08-08 DIAGNOSIS — F410 Panic disorder [episodic paroxysmal anxiety] without agoraphobia: Secondary | ICD-10-CM

## 2016-08-08 DIAGNOSIS — F17210 Nicotine dependence, cigarettes, uncomplicated: Secondary | ICD-10-CM | POA: Insufficient documentation

## 2016-08-08 DIAGNOSIS — F419 Anxiety disorder, unspecified: Secondary | ICD-10-CM | POA: Insufficient documentation

## 2016-08-08 DIAGNOSIS — Z79899 Other long term (current) drug therapy: Secondary | ICD-10-CM | POA: Insufficient documentation

## 2016-08-08 MED ORDER — LORAZEPAM 2 MG/ML IJ SOLN
1.0000 mg | Freq: Once | INTRAMUSCULAR | Status: AC
  Administered 2016-08-08: 1 mg via INTRAMUSCULAR
  Filled 2016-08-08: qty 1

## 2016-08-08 MED ORDER — HYDROXYZINE HCL 25 MG PO TABS: 25.0000 mg | ORAL_TABLET | Freq: Four times a day (QID) | ORAL | 0 refills | Status: DC | PRN

## 2016-08-08 MED ORDER — HYDROXYZINE HCL 25 MG PO TABS: 25.0000 mg | ORAL_TABLET | Freq: Four times a day (QID) | ORAL | 0 refills | Status: DC

## 2016-08-08 NOTE — ED Triage Notes (Signed)
Pt c/o having a panic attack that started x 1 hour ago

## 2016-08-08 NOTE — ED Provider Notes (Signed)
[facility] Provider Note   CSN: [id number] Arrival date & time: 08/08/16  0423     History   Chief Complaint Chief Complaint  Patient presents with  . Panic Attack    HPI Jeffery Hancock is a 26 y.o. male.  Patient complains of "panic attack" this started about one hour ago her resting at home. States he has a history of panic attacks is typical for same but does not take medications for them because he is a polysubstance abuser. States he injected some Suboxone earlier today. Denies any cocaine use, benzodiazepine use. Did have a few drinks of alcohol tonight. States he is not prescribed any medications on a regular basis other than inhalers. Denies any chest pain or shortness of breath. Denies abdominal pain, nausea or vomiting. He feels extremely anxious and jittery.   The history is provided by the patient.    Past Medical History:  Diagnosis Date  . Alcohol abuse   . Anxiety   . Depression   . Hyperlipemia   . Panic attacks   . Polysubstance abuse    crack, oxycodone, heroin, etoh    Patient Active Problem List   Diagnosis Date Noted  . MDD (major depressive disorder), recurrent severe, without psychosis (HCC) 08/02/2014  . Alcohol use disorder, severe, dependence (HCC) 08/02/2014  . Alcohol withdrawal (HCC) 08/02/2014  . 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 Cocaine use disorder, moderate, dependence (HCC) 08/02/2014    History reviewed. No pertinent surgical history.     Home Medications    Prior to Admission medications   Medication Sig Start Date End Date Taking? Authorizing Provider  acetaminophen (TYLENOL) 500 MG tablet Take 1,000 mg by mouth every 6 (six) hours as needed for moderate pain.    [provider]  albuterol (PROVENTIL HFA;VENTOLIN HFA) 108 (90 Base) MCG/ACT inhaler Inhale 1-2 puffs into the lungs every 6 (six) hours as needed for wheezing or  shortness of breath.    [provider]  ibuprofen (ADVIL,MOTRIN) 400 MG tablet Take 400 mg by mouth every 6 (six) hours as needed.    [provider]    Family History Family History  Problem Relation Age of Onset  . Diabetes Other   . Hypertension Other   . Cancer Other   . Heart failure Other   . Alcohol abuse Father   . Depression Brother   . Bipolar disorder Brother     Social History Social History  Substance Use Topics  . Smoking status: Current Some Day Smoker    Packs/day: 1.00    Types: Cigarettes  . Smokeless tobacco: Never Used  . Alcohol use No     Comment: no alcohol 7 months     Allergies   Patient has no known allergies.   Review of Systems Review of Systems  Constitutional: Negative for activity change, appetite change, fatigue and fever.  HENT: Negative for congestion.   Respiratory: Positive for cough. Negative for chest tightness and shortness of breath.   Cardiovascular: Negative for chest pain.  Gastrointestinal: Negative for abdominal pain, nausea and vomiting.  Genitourinary: Negative for dysuria, hematuria and testicular pain.  Musculoskeletal: Negative for arthralgias and myalgias.  Neurological: Negative for dizziness and headaches.  Psychiatric/Behavioral: Negative for suicidal ideas. The patient is nervous/anxious.    all other systems are negative except as noted in the HPI and PMH.     Physical Exam Updated Vital Signs BP Marland Kitchen)  152/121 (BP Location: Right Arm)   Pulse (!) 149   Temp 98.4 F (36.9 C) (Oral)   Resp 20   Ht 6' (1.829 m)   Wt 127 kg (280 lb)   SpO2 98%   BMI 37.97 kg/m   Physical Exam  Constitutional: He is oriented to person, place, and time. He appears well-developed and well-nourished. No distress.  Anxious, dry cough, pacing around room.   HENT:  Head: Normocephalic and atraumatic.  Mouth/Throat: Oropharynx is clear and moist. No oropharyngeal exudate.  Eyes: Conjunctivae and EOM are  normal. Pupils are equal, round, and reactive to light.  Neck: Normal range of motion. Neck supple.  No meningismus.  Cardiovascular: Normal rate, normal heart sounds and intact distal pulses.   No murmur heard. tachycardic  Pulmonary/Chest: Effort normal and breath sounds normal. No respiratory distress. He exhibits no tenderness.  Abdominal: Soft. There is no tenderness. There is no rebound and no guarding.  Musculoskeletal: Normal range of motion. He exhibits no edema or tenderness.  Neurological: He is alert and oriented to person, place, and time. No cranial nerve deficit. He exhibits normal muscle tone. Coordination normal.  No ataxia on finger to nose bilaterally. No pronator drift. 5/5 strength throughout. CN 2-12 intact.Equal grip strength. Sensation intact.   Skin: Skin is warm. Capillary refill takes less than 2 seconds.  Psychiatric: He has a normal mood and affect. His behavior is normal.  Nursing note and vitals reviewed.    ED Treatments / Results  Labs (all labs ordered are listed, but only abnormal results are displayed) Labs Reviewed - No data to display  EKG  EKG Interpretation  Date/Time:  Wednesday August 08 2016 05:39:15 EDT Ventricular Rate:  86 PR Interval:    QRS Duration: 93 QT Interval:  345 QTC Calculation: 413 R Axis:   63 Text Interpretation:  Sinus rhythm Abnormal inferior Q waves Rate slower Confirmed by Glynn Octaveancour, Milad Bublitz [id number]) on 08/08/2016 5:44:54 AM       Radiology No results found.  Procedures Procedures (including critical care time)  Medications Ordered in ED Medications  LORazepam (ATIVAN) injection 1 mg (not administered)     Initial Impression / Assessment and Plan / ED Course  I have reviewed the triage vital signs and the nursing notes.  Pertinent labs & imaging results that were available during my care of the patient were reviewed by me and considered in my medical decision making (see chart for details).     Patient  with "panic attack", hypertensive and tachycardic. Denies chest pain or shortness of breath.  Patient greatly improved after IM Ativan. Denies any chest pain or shortness of breath. Heart rate and blood pressure have normalized. States he smoked some crack two days ago.   He denies any chest pain or shortness of breath currently. He is requesting to be discharged. He denies any suicidal or homicidal thoughts.  Follow up with PCP. Return precautions discussed. Cessation of drug use discussed.  BP 108/73 (BP Location: Right Arm)   Pulse 76   Temp 98.1 F (36.7 C) (Oral)   Resp 18   Ht 6' (1.829 m)   Wt 127 kg (280 lb)   SpO2 98%   BMI 37.97 kg/m    Final Clinical Impressions(s) / ED Diagnoses   Final diagnoses:  Anxiety attack    New Prescriptions New Prescriptions   No medications on file     Glynn Octaveancour, Nery Frappier, MD 08/08/16 [phone]

## 2016-08-08 NOTE — ED Notes (Signed)
Pt made aware to return if symptoms worsen or if any life threatening symptoms occur.   

## 2016-08-08 NOTE — Discharge Instructions (Signed)
Follow up with a primary doctor. Stop using drugs. Return to the ED if you develop new or worsening symptoms.

## 2016-09-26 ENCOUNTER — Emergency Department (HOSPITAL_COMMUNITY)
Admission: EM | Admit: 2016-09-26 | Discharge: 2016-09-26 | Disposition: A | Discharge status: Home / Self Care | Payer: Self-pay | Attending: Emergency Medicine | Admitting: Emergency Medicine

## 2016-09-26 ENCOUNTER — Encounter (HOSPITAL_COMMUNITY): Payer: Self-pay | Admitting: Emergency Medicine

## 2016-09-26 DIAGNOSIS — F419 Anxiety disorder, unspecified: Secondary | ICD-10-CM | POA: Insufficient documentation

## 2016-09-26 DIAGNOSIS — F17210 Nicotine dependence, cigarettes, uncomplicated: Secondary | ICD-10-CM | POA: Insufficient documentation

## 2016-09-26 DIAGNOSIS — F410 Panic disorder [episodic paroxysmal anxiety] without agoraphobia: Secondary | ICD-10-CM

## 2016-09-26 MED ORDER — LORAZEPAM 1 MG PO TABS
1.0000 mg | ORAL_TABLET | Freq: Once | ORAL | Status: AC
  Administered 2016-09-26: 1 mg via ORAL
  Filled 2016-09-26: qty 1

## 2016-09-26 NOTE — ED Provider Notes (Signed)
[facility] Provider Note   CSN: [id number] Arrival date & time: 09/26/16  0301     History   Chief Complaint Chief Complaint  Patient presents with  . Panic Attack    HPI Jeffery Hancock is a 26 y.o. male.  HPI  This is a 26 year old male who presents with complaints of a "panic attack." Patient reports a history of the same. He states that they come on all of a sudden without any provocation. Denies increased stressors. He states "I'm a recovering addict." He states that he does not trust himself with medications and therefore he does not have any medications at home for his anxiety. He has tried Vistaril in the past with no improvement. He denies any physical complaints including chest pain, shortness of breath. He does report tremor. This is classic for his anxiety symptoms. Denies recent alcohol or drug use.  Past Medical History:  Diagnosis Date  . Alcohol abuse   . Anxiety   . Depression   . Hyperlipemia   . Panic attacks   . Polysubstance abuse    crack, oxycodone, heroin, etoh    Patient Active Problem List   Diagnosis Date Noted  . MDD (major depressive disorder), recurrent severe, without psychosis (HCC) 08/02/2014  . Alcohol use disorder, severe, dependence (HCC) 08/02/2014  . Alcohol withdrawal (HCC) 08/02/2014  . 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 Cocaine use disorder, moderate, dependence (HCC) 08/02/2014    History reviewed. No pertinent surgical history.     Home Medications    Prior to Admission medications   Medication Sig Start Date End Date Taking? Authorizing Provider  acetaminophen (TYLENOL) 500 MG tablet Take 1,000 mg by mouth every 6 (six) hours as needed for moderate pain.    [provider]  albuterol (PROVENTIL HFA;VENTOLIN HFA) 108 (90 Base) MCG/ACT inhaler Inhale 1-2 puffs into the lungs every 6 (six) hours as needed for wheezing or  shortness of breath.    [provider]  hydrOXYzine (ATARAX/VISTARIL) 25 MG tablet Take 1 tablet (25 mg total) by mouth every 6 (six) hours as needed for anxiety. 08/08/16   Rancour, Jeannett SeniorStephen, MD  ibuprofen (ADVIL,MOTRIN) 400 MG tablet Take 400 mg by mouth every 6 (six) hours as needed.    [provider]    Family History Family History  Problem Relation Age of Onset  . Diabetes Other   . Hypertension Other   . Cancer Other   . Heart failure Other   . Alcohol abuse Father   . Depression Brother   . Bipolar disorder Brother     Social History Social History  Substance Use Topics  . Smoking status: Current Some Day Smoker    Packs/day: 1.00    Types: Cigarettes  . Smokeless tobacco: Never Used  . Alcohol use No     Comment: no alcohol 7 months     Allergies   Patient has no known allergies.   Review of Systems Review of Systems  Constitutional: Positive for fever.  Respiratory: Negative for shortness of breath.   Cardiovascular: Negative for chest pain and palpitations.  Neurological: Positive for tremors.  All other systems reviewed and are negative.    Physical Exam Updated Vital Signs BP 134/66   Pulse (!) 109   Temp 98.6 F (37 C)   Resp 14   Ht 6' (1.829 m)   Wt 127 kg (280 lb)   SpO2 99%  BMI 37.97 kg/m   Physical Exam  Constitutional: He is oriented to person, place, and time. He appears well-developed and well-nourished.  Anxious appearing, pressured speech  HENT:  Head: Normocephalic and atraumatic.  Cardiovascular: Regular rhythm and normal heart sounds.   No murmur heard. Tachycardia  Pulmonary/Chest: Effort normal and breath sounds normal. No respiratory distress. He has no wheezes.  Increased respiratory rate  Abdominal: Soft. There is no tenderness.  Neurological: He is alert and oriented to person, place, and time.  Skin: Skin is warm and dry.  Psychiatric: He has a normal mood and affect.  Nursing note and vitals  reviewed.    ED Treatments / Results  Labs (all labs ordered are listed, but only abnormal results are displayed) Labs Reviewed - No data to display  EKG  EKG Interpretation  Date/Time:  Wednesday September 26 2016 03:41:33 EDT Ventricular Rate:  142 PR Interval:    QRS Duration: 85 QT Interval:  285 QTC Calculation: 438 R Axis:   64 Text Interpretation:  Sinus tachycardia Anterolateral Q wave, probably normal for age tachycardia when compared to prior Confirmed by Ross MarcusHorton, Courtney ([id number]) on 09/26/2016 4:05:28 AM       Radiology No results found.  Procedures Procedures (including critical care time)  Medications Ordered in ED Medications  LORazepam (ATIVAN) tablet 1 mg (1 mg Oral Given 09/26/16 0333)     Initial Impression / Assessment and Plan / ED Course  I have reviewed the triage vital signs and the nursing notes.  Pertinent labs & imaging results that were available during my care of the patient were reviewed by me and considered in my medical decision making (see chart for details).    Patient presents with reported anxiety attack. Vital signs noted for tachycardia. Sinus tachycardia on EKG. On previous presentation with anxiety attack, he had tachycardia and hypertension. Patient was given Ativan. On multiple rechecks, heart rate improving. On last recheck, heart rate 100. He states he feels much better.  After history, exam, and medical workup I feel the patient has been appropriately medically screened and is safe for discharge home. Pertinent diagnoses were discussed with the patient. Patient was given return precautions.   Final Clinical Impressions(s) / ED Diagnoses   Final diagnoses:  Anxiety attack    New Prescriptions New Prescriptions   No medications on file     Shon BatonHorton, Courtney F, MD 09/26/16 [phone]

## 2016-09-26 NOTE — ED Triage Notes (Signed)
Pt c/o having panic attack x 45 minutes.

## 2016-11-02 ENCOUNTER — Emergency Department (HOSPITAL_COMMUNITY)
Admission: EM | Admit: 2016-11-02 | Discharge: 2016-11-02 | Disposition: A | Discharge status: Home / Self Care | Payer: Self-pay | Attending: Emergency Medicine | Admitting: Emergency Medicine

## 2016-11-02 ENCOUNTER — Encounter (HOSPITAL_COMMUNITY): Payer: Self-pay

## 2016-11-02 ENCOUNTER — Emergency Department (HOSPITAL_COMMUNITY): Payer: Self-pay

## 2016-11-02 DIAGNOSIS — H538 Other visual disturbances: Secondary | ICD-10-CM | POA: Insufficient documentation

## 2016-11-02 DIAGNOSIS — R0602 Shortness of breath: Secondary | ICD-10-CM | POA: Insufficient documentation

## 2016-11-02 DIAGNOSIS — F17210 Nicotine dependence, cigarettes, uncomplicated: Secondary | ICD-10-CM | POA: Insufficient documentation

## 2016-11-02 DIAGNOSIS — R110 Nausea: Secondary | ICD-10-CM | POA: Insufficient documentation

## 2016-11-02 DIAGNOSIS — F1910 Other psychoactive substance abuse, uncomplicated: Secondary | ICD-10-CM | POA: Insufficient documentation

## 2016-11-02 LAB — CBC WITH DIFFERENTIAL/PLATELET
BASOS PCT: 0 %
Basophils Absolute: 0 10*3/uL (ref 0.0–0.1)
EOS ABS: 0.1 10*3/uL (ref 0.0–0.7)
EOS PCT: 1 %
HCT: 44.7 % (ref 39.0–52.0)
HEMOGLOBIN: 16.1 g/dL (ref 13.0–17.0)
Lymphocytes Relative: 25 %
Lymphs Abs: 2.4 10*3/uL (ref 0.7–4.0)
MCH: 31.4 pg (ref 26.0–34.0)
MCHC: 36 g/dL (ref 30.0–36.0)
MCV: 87.3 fL (ref 78.0–100.0)
MONO ABS: 0.8 10*3/uL (ref 0.1–1.0)
MONOS PCT: 8 %
NEUTROS PCT: 66 %
Neutro Abs: 6.4 10*3/uL (ref 1.7–7.7)
PLATELETS: 229 10*3/uL (ref 150–400)
RBC: 5.12 MIL/uL (ref 4.22–5.81)
RDW: 13.8 % (ref 11.5–15.5)
WBC: 9.6 10*3/uL (ref 4.0–10.5)

## 2016-11-02 LAB — SALICYLATE LEVEL: Salicylate Lvl: <7 mg/dL (ref 2.8–30.0)

## 2016-11-02 LAB — COMPREHENSIVE METABOLIC PANEL
ALT: 31 U/L (ref 17–63)
AST: 33 U/L (ref 15–41)
Albumin: 4.6 g/dL (ref 3.5–5.0)
Alkaline Phosphatase: 80 U/L (ref 38–126)
Anion gap: 14 (ref 5–15)
BILIRUBIN TOTAL: 0.7 mg/dL (ref 0.3–1.2)
BUN: 9 mg/dL (ref 6–20)
CHLORIDE: 105 mmol/L (ref 101–111)
CO2: 19 mmol/L — ABNORMAL LOW (ref 22–32)
CREATININE: 0.9 mg/dL (ref 0.61–1.24)
Calcium: 9.2 mg/dL (ref 8.9–10.3)
Glucose, Bld: 99 mg/dL (ref 65–99)
Potassium: 3.5 mmol/L (ref 3.5–5.1)
Sodium: 138 mmol/L (ref 135–145)
TOTAL PROTEIN: 8.9 g/dL — AB (ref 6.5–8.1)

## 2016-11-02 LAB — RAPID URINE DRUG SCREEN, HOSP PERFORMED
Amphetamines: NOT DETECTED
BARBITURATES: NOT DETECTED
BENZODIAZEPINES: NOT DETECTED
COCAINE: POSITIVE — AB
Opiates: POSITIVE — AB
TETRAHYDROCANNABINOL: NOT DETECTED

## 2016-11-02 LAB — TROPONIN I

## 2016-11-02 LAB — ACETAMINOPHEN LEVEL

## 2016-11-02 LAB — ETHANOL: ALCOHOL ETHYL (B): 88 mg/dL — AB (ref <5)

## 2016-11-02 MED ORDER — LORAZEPAM 2 MG/ML IJ SOLN
1.0000 mg | Freq: Once | INTRAMUSCULAR | Status: AC
  Administered 2016-11-02: 1 mg via INTRAVENOUS
  Filled 2016-11-02: qty 1

## 2016-11-02 MED ORDER — SODIUM CHLORIDE 0.9 % IV BOLUS (SEPSIS)
1000.0000 mL | Freq: Once | INTRAVENOUS | Status: AC
  Administered 2016-11-02: 1000 mL via INTRAVENOUS

## 2016-11-02 MED ORDER — IOPAMIDOL (ISOVUE-370) INJECTION 76%
100.0000 mL | Freq: Once | INTRAVENOUS | Status: AC | PRN
  Administered 2016-11-02: 100 mL via INTRAVENOUS

## 2016-11-02 NOTE — ED Notes (Signed)
Patient transported to CT 

## 2016-11-02 NOTE — ED Notes (Signed)
Pt states he came is because of shaking legs and double vision, never experienced that before, states vision is better at present, pt is sinus tach on monitor

## 2016-11-02 NOTE — ED Provider Notes (Signed)
[facility]-EMERGENCY DEPT Provider Note   CSN: [id number] Arrival date & time: 11/02/16  1818     History   Chief Complaint Chief Complaint  Patient presents with  . Drug Overdose    HPI Jeffery Hancock is a 26 y.o. male.  HPI  Pt was seen at 1830. Per pt, c/o sudden onset and resolution of one episode of "not feeling right after I shot up heroin" approximately PTA. Pt states he shot up heroin from a different batch than usual PTA, and then felt "shaky all over," SOB, nauseated, and his "vision blurred." Pt states he has hx of panic attacks and "thinks maybe I had one" when he initially started to feel shaky after shooting up. States he feels "a little anxious" now." Endorses cocaine use earlier today. Denies palpitations/CP, no cough, no abd pain, no vomiting/diarrhea, no back or neck pain, no focal motor weakness, no tingling/numbness in extremities.   Past Medical History:  Diagnosis Date  . Alcohol abuse   . Anxiety   . Depression   . Hyperlipemia   . Panic attacks   . Polysubstance abuse    crack, oxycodone, heroin, etoh    Patient Active Problem List   Diagnosis Date Noted  . MDD (major depressive disorder), recurrent severe, without psychosis (HCC) 08/02/2014  . Alcohol use disorder, severe, dependence (HCC) 08/02/2014  . Alcohol withdrawal (HCC) 08/02/2014  . 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 Cocaine use disorder, moderate, dependence (HCC) 08/02/2014    History reviewed. No pertinent surgical history.   Home Medications    Prior to Admission medications   Medication Sig Start Date End Date Taking? Authorizing Provider  ibuprofen (ADVIL,MOTRIN) 400 MG tablet Take 400 mg by mouth every 6 (six) hours as needed.   Yes [provider]  albuterol (PROVENTIL HFA;VENTOLIN HFA) 108 (90 Base) MCG/ACT inhaler Inhale 1-2 puffs into the lungs every 6 (six) hours as needed for  wheezing or shortness of breath.    [provider]    Family History Family History  Problem Relation Age of Onset  . Diabetes Other   . Hypertension Other   . Cancer Other   . Heart failure Other   . Alcohol abuse Father   . Depression Brother   . Bipolar disorder Brother     Social History Social History  Substance Use Topics  . Smoking status: Current Some Day Smoker    Packs/day: 1.00    Types: Cigarettes  . Smokeless tobacco: Never Used  . Alcohol use Yes     Comment: drinks daily- 4-5 40 oz of beer     Allergies   Patient has no known allergies.   Review of Systems Review of Systems ROS: Statement: All systems negative except as marked or noted in the HPI; Constitutional: Negative for fever and chills. +"shaky all over," "blurry vision," panic attack.; ; Eyes: Negative for eye pain, redness and discharge. ; ; ENMT: Negative for ear pain, hoarseness, nasal congestion, sinus pressure and sore throat. ; ; Cardiovascular: Negative for chest pain, palpitations, diaphoresis, dyspnea and peripheral edema. ; ; Respiratory: Negative for cough, wheezing and stridor. ; ; Gastrointestinal: +nausea. Negative for vomiting, diarrhea, abdominal pain, blood in stool, hematemesis, jaundice and rectal bleeding. . ; ; Genitourinary: Negative for dysuria, flank pain and hematuria. ; ; Musculoskeletal: Negative for back pain and neck pain. Negative for swelling and trauma.; ; Skin: Negative for pruritus, rash, abrasions, blisters,  bruising and skin lesion.; ; Neuro: Negative for headache, lightheadedness and neck stiffness. Negative for weakness, altered level of consciousness, altered mental status, extremity weakness, paresthesias, involuntary movement, seizure and syncope.; Psych:  +anxiety/panic attack. No SI, no SA, no HI, no hallucinations.      Physical Exam Updated Vital Signs BP 119/64   Pulse (!) 104   Temp 100.3 F (37.9 C) (Oral)   Resp 13   Wt 127 kg (280 lb)    SpO2 97%   BMI 37.97 kg/m   Patient Vitals for the past 24 hrs:  BP Temp Temp src Pulse Resp SpO2 Weight  11/02/16 2052 - 98.7 F (37.1 C) - - - - -  11/02/16 2042 119/69 - - 87 - 99 % -  11/02/16 2040 - - - 89 - 99 % -  11/02/16 2030 123/65 - - 96 17 99 % -  11/02/16 2015 - - - (!) 105 - 95 % -  11/02/16 2001 124/88 - - (!) 106 - 99 % -  11/02/16 1900 119/64 - - (!) 104 13 97 % -  11/02/16 1830 133/79 - - (!) 112 13 91 % -  11/02/16 1823 (!) 167/93 100.3 F (37.9 C) Oral (!) 137 14 97 % 127 kg (280 lb)      Physical Exam 1835: Physical examination:  Nursing notes reviewed; Vital signs and O2 SAT reviewed;  Constitutional: Well developed, Well nourished, Well hydrated, In no acute distress; Head:  Normocephalic, atraumatic; Eyes: EOMI, PERRL, No scleral icterus; ENMT: Mouth and pharynx normal, Mucous membranes moist; Neck: Supple, Full range of motion, No lymphadenopathy; Cardiovascular: Tachycardic rate and rhythm, No gallop; Respiratory: Breath sounds clear & equal bilaterally, No wheezes.  Speaking full sentences with ease, Normal respiratory effort/excursion; Chest: Nontender, Movement normal; Abdomen: Soft, Nontender, Nondistended, Normal bowel sounds; Genitourinary: No CVA tenderness; Extremities: Pulses normal, No tenderness, No edema, No calf edema or asymmetry.; Neuro: AA&Ox3, Major CN grossly intact.  Speech clear. No gross focal motor or sensory deficits in extremities.; Skin: Color normal, Warm, Diaphoretic.; Psych:  Anxious, rapid/pressured speech.    ED Treatments / Results  Labs (all labs ordered are listed, but only abnormal results are displayed)   EKG  EKG Interpretation  Date/Time:  Friday November 02 2016 18:23:19 EDT Ventricular Rate:  136 PR Interval:    QRS Duration: 81 QT Interval:  289 QTC Calculation: 435 R Axis:   85 Text Interpretation:  Sinus tachycardia Probable inferior infarct, old When compared with ECG of 09/26/2016 No significant change was  found Confirmed by Samuel Jester [phone]) on 11/02/2016 7:36:08 PM       Radiology   Procedures Procedures (including critical care time)  Medications Ordered in ED Medications  LORazepam (ATIVAN) injection 1 mg (1 mg Intravenous Given 11/02/16 1849)  sodium chloride 0.9 % bolus 1,000 mL (1,000 mLs Intravenous New Bag/Given 11/02/16 1849)  iopamidol (ISOVUE-370) 76 % injection 100 mL (100 mLs Intravenous Contrast Given 11/02/16 1919)     Initial Impression / Assessment and Plan / ED Course  I have reviewed the triage vital signs and the nursing notes.  Pertinent labs & imaging results that were available during my care of the patient were reviewed by me and considered in my medical decision making (see chart for details).  MDM Reviewed: previous chart, nursing note and vitals Reviewed previous: ECG and labs Interpretation: labs, ECG and CT scan   Results for orders placed or performed during the hospital encounter of 11/02/16  Urine rapid drug screen (hosp performed)  Result Value Ref Range   Opiates POSITIVE (A) NONE DETECTED   Cocaine POSITIVE (A) NONE DETECTED   Benzodiazepines NONE DETECTED NONE DETECTED   Amphetamines NONE DETECTED NONE DETECTED   Tetrahydrocannabinol NONE DETECTED NONE DETECTED   Barbiturates NONE DETECTED NONE DETECTED  Comprehensive metabolic panel  Result Value Ref Range   Sodium 138 135 - 145 mmol/L   Potassium 3.5 3.5 - 5.1 mmol/L   Chloride 105 101 - 111 mmol/L   CO2 19 (L) 22 - 32 mmol/L   Glucose, Bld 99 65 - 99 mg/dL   BUN 9 6 - 20 mg/dL   Creatinine, Ser 1.61 0.61 - 1.24 mg/dL   Calcium 9.2 8.9 - 09.6 mg/dL   Total Protein 8.9 (H) 6.5 - 8.1 g/dL   Albumin 4.6 3.5 - 5.0 g/dL   AST 33 15 - 41 U/L   ALT 31 17 - 63 U/L   Alkaline Phosphatase 80 38 - 126 U/L   Total Bilirubin 0.7 0.3 - 1.2 mg/dL   GFR calc non Af Amer >60 >60 mL/min   GFR calc Af Amer >60 >60 mL/min   Anion gap 14 5 - 15  Acetaminophen level  Result Value Ref Range    Acetaminophen (Tylenol), Serum <10 (L) 10 - 30 ug/mL  Ethanol  Result Value Ref Range   Alcohol, Ethyl (B) 88 (H) <5 mg/dL  Salicylate level  Result Value Ref Range   Salicylate Lvl <7.0 2.8 - 30.0 mg/dL  CBC with Differential  Result Value Ref Range   WBC 9.6 4.0 - 10.5 K/uL   RBC 5.12 4.22 - 5.81 MIL/uL   Hemoglobin 16.1 13.0 - 17.0 g/dL   HCT 04.5 40.9 - 81.1 %   MCV 87.3 78.0 - 100.0 fL   MCH 31.4 26.0 - 34.0 pg   MCHC 36.0 30.0 - 36.0 g/dL   RDW 91.4 78.2 - 95.6 %   Platelets 229 150 - 400 K/uL   Neutrophils Relative % 66 %   Neutro Abs 6.4 1.7 - 7.7 K/uL   Lymphocytes Relative 25 %   Lymphs Abs 2.4 0.7 - 4.0 K/uL   Monocytes Relative 8 %   Monocytes Absolute 0.8 0.1 - 1.0 K/uL   Eosinophils Relative 1 %   Eosinophils Absolute 0.1 0.0 - 0.7 K/uL   Basophils Relative 0 %   Basophils Absolute 0.0 0.0 - 0.1 K/uL  Troponin I  Result Value Ref Range   Troponin I <0.03 <0.03 ng/mL   Ct Head Wo Contrast Result Date: 11/02/2016 CLINICAL DATA:  Blurry vision after taking heroin 15 minutes ago. Difficulty breathing. EXAM: CT HEAD WITHOUT CONTRAST TECHNIQUE: Contiguous axial images were obtained from the base of the skull through the vertex without intravenous contrast. COMPARISON:  None. FINDINGS: Brain: No evidence of acute infarction, hemorrhage, hydrocephalus, extra-axial collection or mass lesion/mass effect. Vascular: No hyperdense vessel or unexpected calcification. Skull: Normal. Negative for fracture or focal lesion. Sinuses/Orbits: No acute finding. Other: None. IMPRESSION: No acute intracranial abnormality Electronically Signed   By: Tollie Eth M.D.   On: 11/02/2016 19:36   Ct Angio Chest Pe W/cm &/or Wo Cm Result Date: 11/02/2016 CLINICAL DATA:  Dyspnea after heroin use 15 minutes ago. EXAM: CT ANGIOGRAPHY CHEST WITH CONTRAST TECHNIQUE: Multidetector CT imaging of the chest was performed using the standard protocol during bolus administration of intravenous contrast.  Multiplanar CT image reconstructions and MIPs were obtained to evaluate the vascular anatomy. CONTRAST:  100 cc Isovue 370 IV COMPARISON:  CXR 01/08/2016 FINDINGS: Cardiovascular: The study is of quality for the evaluation of pulmonary embolism. There are no filling defects in the central, lobar, segmental or subsegmental pulmonary artery branches to suggest acute pulmonary embolism. Great vessels are normal in course and caliber. Normal heart size. No significant pericardial fluid/thickening. Mediastinum/Nodes: No discrete thyroid nodules. Unremarkable esophagus. No pathologically enlarged axillary, mediastinal or hilar lymph nodes. Lungs/Pleura: No pneumothorax. No pleural effusion. No pulmonary edema or pneumonic consolidation. Upper abdomen: Unremarkable. Musculoskeletal:  No aggressive appearing focal osseous lesions. Review of the MIP images confirms the above findings. IMPRESSION: 1. No active cardiopulmonary disease. 2. No pulmonary embolus or pneumonic consolidation. 3. No pulmonary edema. Electronically Signed   By: Tollie Ethavid  Kwon M.D.   On: 11/02/2016 19:45    2050:  Pt states he "feels better now" after IV ativan and IVF. VS improved. Neuro exam remains intact/unchanged, resps easy, abd benign. Pt has ambulated with steady gait, easy resps, NAD. No clear indication for admission at this time and pt states he feels better and wants to go home. Return precautions given. Dx and testing d/w pt. Questions answered.  Verb understanding, agreeable to d/c home with outpt f/u.   Final Clinical Impressions(s) / ED Diagnoses   Final diagnoses:  None    New Prescriptions New Prescriptions   No medications on file     Samuel JesterMcManus, Tasheika Kitzmiller, DO 11/07/16 1327

## 2016-11-02 NOTE — ED Triage Notes (Signed)
Pt reports that he shot up with heroin approx 15 minutes ago and instantly began having blurred vision, shaking all over and difficulty breathing. Pt reports this was a different batch. Had previously done heroin earlier and didn't have this experience

## 2016-11-02 NOTE — Discharge Instructions (Signed)
Substance Abuse Treatment Programs ° °Intensive Outpatient Programs °[facility]     °[address]      °[place], [place]                   °[phone]      ° °[facility] °[address] °[place], [place] °[phone] ° °[facility]     °(Inpatient and outpatient)     °[address]           °[phone]   ° °[facility] °[phone] (Suboxone and Methadone) ° °[address]      °[place], [place] [address]      °[phone]      ° °[address] °[place], [place] °852-3033 ° °[facility] (Outpatient/Inpatient, Chemical)    °(insurance only) [phone]      °       °[facility] (Groups & Residential) °[place], [place] °[phone] ° °   °[facility]     °[address]     °[place], [place]      °[phone]      ° °[facility] (for caregivers and family) °[address] °[place], [place] °336-299-4655 ° ° ° ° ° °Residential Treatment Programs °[facility]      °[address], [place], [place] [address]  °[phone]      ° °[facility] °[address] [place], [place] [address] °[phone] ° °[facility]        °[phone]      ° °[facility] °[phone] ° °[facility]             °[address]                                         °[place], [place]                                                °[phone] or [phone]                              ° °[facility] [place] °[address] °[place], [address] °[phone] ° °[facility]    °[address]      °, [place]     °[phone]      ° °[facility] °[address] °, [place] °336-285-9073 ° °[facility]   °[address]     °[place], [place] [address]     °[phone]      °Admissions: 8am-3pm M-F ° °[facility] °[address] °[place] °336-227-7417 ° °[facility]: Residential Program (90 Days)   °[place], [place]      °[phone] or [phone]    ° °[facility] °[place], [place] °(Walk in Hours over the weekend or by referral) ° °[facility] °[address], [place], [place] [address] °([phone] ° °Crisis Mobile: [facility]:  1-877-626-1772 (for crisis response 24 hours a day) °[facility] Hotline:      1-800-256-2452 °Outpatient Psychiatry and Counseling ° °[facility]: Mobile Crisis   Management 24 hours:  1-877-626-1772 ° °[facility] sliding scale fee and walk in schedule: M-F 8am-12pm/1pm-3pm °[address]  °[place], [place] [address] °[phone] ° °[facility] °[address] °[place], [place] [address] °[phone] ° °[facility] ([facility])- new patient walk-in appointments available Monday - Friday 8am -3pm.          °[address] °[place], [place] [address] °336-676-6840 or crisis line- 336-676-6905 ° °[facility] Intensive [facility]patient Therapy Program °[address] °[place], [place] [address] °336-832-9804 ° °[facility]                  °Crisis Services      °[phone]      °[address]     °[place], [place] [address]                ° °[facility]   °[facility] °[phone] °[address] °[place], [place] [address] ° ° °[facility]          °[address]  °[place], [place] [address]       °[phone] ° °[facility] °[address] °[place], [place] [address] °336-292-1510 ° °[facility]    °[address]    °[place], [place] [address]     °[phone]      ° °Parish McKinney, MD     °[address]     °[place] [address]     °336-282-1251     °  °[facility] °[address] °[place] [address] ° °[facility]     °[address]     °[place], [place]      °336-542-2076      ° °[facility] °Shamsher Ahluwalia, MD °[address] °[place], [place] [address] °[phone] ° °[facility]     °[address]     °[place], [place] [address]     °336-274-1237      ° °[facility] °[address] °[place], [place] [address] °336-854-4450 °Resources for Temporary Residential Assistance/Crisis Centers ° °DAY CENTERS °[facility] °M-F 8am-3pm   °[address] [place], [place] [address]   336-332-0824 °Services include: laundry, barbering, support groups, case management, phone  & computer access, showers, AA/NA mtgs, mental health/substance abuse nurse, job skills class, disability information, VA assistance, spiritual classes, etc.  ° °HOMELESS SHELTERS ° °[facility]     °[facility]   °[address], [place]     °[phone]       °       °[facility] (women and children)       °[address] °[place], [place] [address] °336-275-0820 °[email or web address] for application and process °Application Required ° °[facility]   °[address]    °[place] [address]     °[phone]       °             °[facility] °[address] °[place], [place] [address] °336.273.5572 °336-235-0363(schedule application appt.) °Application Required ° °[facility] (women only)    °[address]     °[place], [place] [address]     °336-884-1039      °  Intake starts 6pm daily Need valid ID, SSC, & Police report [facility] [place] [address] [place], [place] [phone] Application Required  [facility] (men only)     [address]      [place], [place]     [phone]       [facility] (Pregnant women only) [address] [place], [place] [phone]  [facility]      [address]      [place], [place] [address]     [phone]             [facility] [address] [place], [place] [phone] 90 day commitment/SA/Application process  [facility](men only)     [address]     [place]     [phone]       Check-in at [facility]7pm            [facility] [address] [place], [place] [phone] Men/Women/Women and Children must be there by 7 pm  [facility] [place] [phone]                  Take your usual prescriptions as previously directed.  Call your regular medical doctor on Tuesday to schedule a follow up appointment within the next 3 to 4 days. Call the substance abuse resources given you to today if you are interested in detox.  Return to the Emergency Department immediately sooner if worsening.

## 2017-03-11 ENCOUNTER — Encounter (HOSPITAL_COMMUNITY): Payer: Self-pay | Admitting: Emergency Medicine

## 2017-03-11 ENCOUNTER — Emergency Department (HOSPITAL_COMMUNITY)
Admission: EM | Admit: 2017-03-11 | Discharge: 2017-03-11 | Disposition: A | Discharge status: Home / Self Care | Payer: Self-pay | Attending: Emergency Medicine | Admitting: Emergency Medicine

## 2017-03-11 ENCOUNTER — Other Ambulatory Visit: Payer: Self-pay

## 2017-03-11 DIAGNOSIS — F17210 Nicotine dependence, cigarettes, uncomplicated: Secondary | ICD-10-CM | POA: Insufficient documentation

## 2017-03-11 DIAGNOSIS — F410 Panic disorder [episodic paroxysmal anxiety] without agoraphobia: Secondary | ICD-10-CM | POA: Insufficient documentation

## 2017-03-11 DIAGNOSIS — F1910 Other psychoactive substance abuse, uncomplicated: Secondary | ICD-10-CM | POA: Insufficient documentation

## 2017-03-11 DIAGNOSIS — F1420 Cocaine dependence, uncomplicated: Secondary | ICD-10-CM | POA: Insufficient documentation

## 2017-03-11 DIAGNOSIS — F1020 Alcohol dependence, uncomplicated: Secondary | ICD-10-CM | POA: Insufficient documentation

## 2017-03-11 LAB — COMPREHENSIVE METABOLIC PANEL
ALBUMIN: 4.6 g/dL (ref 3.5–5.0)
ALT: 24 U/L (ref 17–63)
AST: 30 U/L (ref 15–41)
Alkaline Phosphatase: 72 U/L (ref 38–126)
Anion gap: 13 (ref 5–15)
BILIRUBIN TOTAL: 0.6 mg/dL (ref 0.3–1.2)
BUN: 17 mg/dL (ref 6–20)
CHLORIDE: 105 mmol/L (ref 101–111)
CO2: 23 mmol/L (ref 22–32)
Calcium: 9.6 mg/dL (ref 8.9–10.3)
Creatinine, Ser: 0.94 mg/dL (ref 0.61–1.24)
GFR calc Af Amer: >60 mL/min (ref >60)
GFR calc non Af Amer: >60 mL/min (ref >60)
GLUCOSE: 79 mg/dL (ref 65–99)
POTASSIUM: 3.8 mmol/L (ref 3.5–5.1)
Sodium: 141 mmol/L (ref 135–145)
Total Protein: 8.6 g/dL — ABNORMAL HIGH (ref 6.5–8.1)

## 2017-03-11 LAB — CBC WITH DIFFERENTIAL/PLATELET
BASOS ABS: 0 10*3/uL (ref 0.0–0.1)
Basophils Relative: 0 %
EOS ABS: 0.1 10*3/uL (ref 0.0–0.7)
EOS PCT: 1 %
HCT: 46.2 % (ref 39.0–52.0)
Hemoglobin: 16 g/dL (ref 13.0–17.0)
Lymphocytes Relative: 21 %
Lymphs Abs: 2.1 10*3/uL (ref 0.7–4.0)
MCH: 30.5 pg (ref 26.0–34.0)
MCHC: 34.6 g/dL (ref 30.0–36.0)
MCV: 88 fL (ref 78.0–100.0)
Monocytes Absolute: 0.9 10*3/uL (ref 0.1–1.0)
Monocytes Relative: 9 %
Neutro Abs: 6.7 10*3/uL (ref 1.7–7.7)
Neutrophils Relative %: 69 %
PLATELETS: 214 10*3/uL (ref 150–400)
RBC: 5.25 MIL/uL (ref 4.22–5.81)
RDW: 13.6 % (ref 11.5–15.5)
WBC: 9.9 10*3/uL (ref 4.0–10.5)

## 2017-03-11 LAB — RAPID URINE DRUG SCREEN, HOSP PERFORMED
AMPHETAMINES: NOT DETECTED
BARBITURATES: NOT DETECTED
BENZODIAZEPINES: POSITIVE — AB
Cocaine: POSITIVE — AB
Opiates: NOT DETECTED
TETRAHYDROCANNABINOL: NOT DETECTED

## 2017-03-11 LAB — ETHANOL: Alcohol, Ethyl (B): 19 mg/dL — ABNORMAL HIGH (ref <10)

## 2017-03-11 NOTE — ED Notes (Signed)
EDP at bedside  

## 2017-03-11 NOTE — ED Notes (Signed)
Pt states he drank 4-5 beers, did "some cocaine", and had 2 xanax bars today.

## 2017-03-11 NOTE — ED Notes (Signed)
TTS in process 

## 2017-03-11 NOTE — ED Notes (Signed)
Patient is resting comfortably. 
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
## 2017-03-11 NOTE — ED Notes (Signed)
Pt sleeping at this time.

## 2017-03-11 NOTE — ED Notes (Signed)
Sitter D/C'ed

## 2017-03-11 NOTE — ED Notes (Signed)
Pt wanded by security prior to and after changing into purple scrubs. Belongings placed in labeled and locked locker.
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
## 2017-03-11 NOTE — ED Notes (Signed)
Pt resting with eyes closed, respirations even and unlabored. MD Mesner notified of pt's persistent lethargy.

## 2017-03-11 NOTE — Discharge Instructions (Signed)
Follow-up as instructed by behavioral health do not use any illegal drugs
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
## 2017-03-11 NOTE — ED Notes (Signed)
MD Mesner notified of downward trend in BP.

## 2017-03-11 NOTE — ED Notes (Signed)
Patient wanded by security. 

## 2017-03-11 NOTE — ED Notes (Addendum)
Pt unable to provide urine specimen at this time. Dinner tray provided and pt notified of need for urine specimen. Pt lethargic but able to hold a conversation and correctly states name, DOB, year, place, and situation.

## 2017-03-14 ENCOUNTER — Inpatient Hospital Stay (HOSPITAL_COMMUNITY)
Admission: AD | Admit: 2017-03-14 | Discharge: 2017-03-19 | DRG: 885 | Disposition: A | Discharge status: Home / Self Care | Payer: Federal, State, Local not specified - Other | Source: Intra-hospital | Attending: Psychiatry | Admitting: Psychiatry

## 2017-03-14 ENCOUNTER — Encounter (HOSPITAL_COMMUNITY): Payer: Self-pay | Admitting: *Deleted

## 2017-03-14 ENCOUNTER — Emergency Department (HOSPITAL_COMMUNITY)
Admission: EM | Admit: 2017-03-14 | Discharge: 2017-03-14 | Disposition: A | Discharge status: Other Acute Inpatient Hospital | Payer: Self-pay | Attending: Emergency Medicine | Admitting: Emergency Medicine

## 2017-03-14 ENCOUNTER — Encounter (HOSPITAL_COMMUNITY): Payer: Self-pay

## 2017-03-14 ENCOUNTER — Other Ambulatory Visit: Payer: Self-pay

## 2017-03-14 DIAGNOSIS — F17210 Nicotine dependence, cigarettes, uncomplicated: Secondary | ICD-10-CM | POA: Diagnosis not present

## 2017-03-14 DIAGNOSIS — F1994 Other psychoactive substance use, unspecified with psychoactive substance-induced mood disorder: Secondary | ICD-10-CM | POA: Diagnosis not present

## 2017-03-14 DIAGNOSIS — Z811 Family history of alcohol abuse and dependence: Secondary | ICD-10-CM | POA: Diagnosis not present

## 2017-03-14 DIAGNOSIS — E785 Hyperlipidemia, unspecified: Secondary | ICD-10-CM | POA: Diagnosis present

## 2017-03-14 DIAGNOSIS — F1490 Cocaine use, unspecified, uncomplicated: Secondary | ICD-10-CM | POA: Diagnosis present

## 2017-03-14 DIAGNOSIS — F1910 Other psychoactive substance abuse, uncomplicated: Secondary | ICD-10-CM | POA: Insufficient documentation

## 2017-03-14 DIAGNOSIS — F1094 Alcohol use, unspecified with alcohol-induced mood disorder: Secondary | ICD-10-CM | POA: Diagnosis present

## 2017-03-14 DIAGNOSIS — F410 Panic disorder [episodic paroxysmal anxiety] without agoraphobia: Secondary | ICD-10-CM | POA: Diagnosis present

## 2017-03-14 DIAGNOSIS — R45851 Suicidal ideations: Secondary | ICD-10-CM | POA: Diagnosis present

## 2017-03-14 DIAGNOSIS — F322 Major depressive disorder, single episode, severe without psychotic features: Secondary | ICD-10-CM | POA: Diagnosis present

## 2017-03-14 DIAGNOSIS — F1020 Alcohol dependence, uncomplicated: Secondary | ICD-10-CM | POA: Diagnosis not present

## 2017-03-14 DIAGNOSIS — F1024 Alcohol dependence with alcohol-induced mood disorder: Secondary | ICD-10-CM | POA: Diagnosis present

## 2017-03-14 DIAGNOSIS — Z813 Family history of other psychoactive substance abuse and dependence: Secondary | ICD-10-CM | POA: Diagnosis not present

## 2017-03-14 DIAGNOSIS — Z818 Family history of other mental and behavioral disorders: Secondary | ICD-10-CM | POA: Diagnosis not present

## 2017-03-14 DIAGNOSIS — F419 Anxiety disorder, unspecified: Secondary | ICD-10-CM | POA: Diagnosis present

## 2017-03-14 DIAGNOSIS — Z79899 Other long term (current) drug therapy: Secondary | ICD-10-CM | POA: Insufficient documentation

## 2017-03-14 DIAGNOSIS — G4700 Insomnia, unspecified: Secondary | ICD-10-CM | POA: Diagnosis present

## 2017-03-14 LAB — COMPREHENSIVE METABOLIC PANEL
ALT: 37 U/L (ref 17–63)
AST: 33 U/L (ref 15–41)
Albumin: 4.6 g/dL (ref 3.5–5.0)
Alkaline Phosphatase: 76 U/L (ref 38–126)
Anion gap: 16 — ABNORMAL HIGH (ref 5–15)
BUN: 11 mg/dL (ref 6–20)
CHLORIDE: 106 mmol/L (ref 101–111)
CO2: 20 mmol/L — ABNORMAL LOW (ref 22–32)
CREATININE: 0.88 mg/dL (ref 0.61–1.24)
Calcium: 9.2 mg/dL (ref 8.9–10.3)
GFR calc Af Amer: >60 mL/min (ref >60)
Glucose, Bld: 117 mg/dL — ABNORMAL HIGH (ref 65–99)
Potassium: 3.6 mmol/L (ref 3.5–5.1)
Sodium: 142 mmol/L (ref 135–145)
Total Bilirubin: 0.2 mg/dL — ABNORMAL LOW (ref 0.3–1.2)
Total Protein: 8.7 g/dL — ABNORMAL HIGH (ref 6.5–8.1)

## 2017-03-14 LAB — CBC WITH DIFFERENTIAL/PLATELET
Basophils Absolute: 0 10*3/uL (ref 0.0–0.1)
Basophils Relative: 0 %
EOS ABS: 0.1 10*3/uL (ref 0.0–0.7)
EOS PCT: 1 %
HCT: 47.8 % (ref 39.0–52.0)
Hemoglobin: 16.9 g/dL (ref 13.0–17.0)
LYMPHS ABS: 2.5 10*3/uL (ref 0.7–4.0)
Lymphocytes Relative: 34 %
MCH: 31.3 pg (ref 26.0–34.0)
MCHC: 35.4 g/dL (ref 30.0–36.0)
MCV: 88.5 fL (ref 78.0–100.0)
MONO ABS: 0.5 10*3/uL (ref 0.1–1.0)
Monocytes Relative: 7 %
Neutro Abs: 4.3 10*3/uL (ref 1.7–7.7)
Neutrophils Relative %: 58 %
PLATELETS: 248 10*3/uL (ref 150–400)
RBC: 5.4 MIL/uL (ref 4.22–5.81)
RDW: 13.1 % (ref 11.5–15.5)
WBC: 7.4 10*3/uL (ref 4.0–10.5)

## 2017-03-14 LAB — RAPID URINE DRUG SCREEN, HOSP PERFORMED
Amphetamines: NOT DETECTED
Barbiturates: NOT DETECTED
Benzodiazepines: POSITIVE — AB
Cocaine: POSITIVE — AB
OPIATES: NOT DETECTED
Tetrahydrocannabinol: NOT DETECTED

## 2017-03-14 LAB — ACETAMINOPHEN LEVEL

## 2017-03-14 LAB — ETHANOL: ALCOHOL ETHYL (B): 52 mg/dL — AB (ref <10)

## 2017-03-14 LAB — SALICYLATE LEVEL: Salicylate Lvl: <7 mg/dL (ref 2.8–30.0)

## 2017-03-14 MED ORDER — LORAZEPAM 2 MG/ML IJ SOLN
2.0000 mg | Freq: Once | INTRAMUSCULAR | Status: AC
  Administered 2017-03-14: 2 mg via INTRAVENOUS
  Filled 2017-03-14: qty 1

## 2017-03-14 MED ORDER — THIAMINE HCL 100 MG/ML IJ SOLN: 100.0000 mg | Freq: Once | INTRAMUSCULAR | Status: DC

## 2017-03-14 MED ORDER — LOPERAMIDE HCL 2 MG PO CAPS: 2.0000 mg | ORAL_CAPSULE | ORAL | Status: AC | PRN

## 2017-03-14 MED ORDER — ONDANSETRON 4 MG PO TBDP: 4.0000 mg | ORAL_TABLET | Freq: Four times a day (QID) | ORAL | Status: AC | PRN

## 2017-03-14 MED ORDER — NICOTINE 21 MG/24HR TD PT24
21.0000 mg | MEDICATED_PATCH | Freq: Every day | TRANSDERMAL | Status: DC
  Administered 2017-03-14 – 2017-03-19 (×6): 21 mg via TRANSDERMAL
  Filled 2017-03-14 (×8): qty 1

## 2017-03-14 MED ORDER — ALBUTEROL SULFATE HFA 108 (90 BASE) MCG/ACT IN AERS
1.0000 | INHALATION_SPRAY | RESPIRATORY_TRACT | Status: DC | PRN
  Filled 2017-03-14: qty 6.7

## 2017-03-14 MED ORDER — VITAMIN B-1 100 MG PO TABS
100.0000 mg | ORAL_TABLET | Freq: Every day | ORAL | Status: DC
  Administered 2017-03-15 – 2017-03-19 (×5): 100 mg via ORAL
  Filled 2017-03-14 (×9): qty 1

## 2017-03-14 MED ORDER — VITAMIN B-1 100 MG PO TABS
100.0000 mg | ORAL_TABLET | Freq: Every day | ORAL | Status: DC
  Administered 2017-03-14: 100 mg via ORAL
  Filled 2017-03-14: qty 1

## 2017-03-14 MED ORDER — SODIUM CHLORIDE 0.9 % IV BOLUS (SEPSIS)
1000.0000 mL | Freq: Once | INTRAVENOUS | Status: AC
  Administered 2017-03-14: 1000 mL via INTRAVENOUS

## 2017-03-14 MED ORDER — LORAZEPAM 1 MG PO TABS: 0.0000 mg | ORAL_TABLET | Freq: Four times a day (QID) | ORAL | Status: DC

## 2017-03-14 MED ORDER — ACETAMINOPHEN 325 MG PO TABS: 650.0000 mg | ORAL_TABLET | Freq: Four times a day (QID) | ORAL | Status: DC | PRN

## 2017-03-14 MED ORDER — HYDROXYZINE HCL 25 MG PO TABS
25.0000 mg | ORAL_TABLET | Freq: Four times a day (QID) | ORAL | Status: AC | PRN
  Administered 2017-03-14 – 2017-03-16 (×3): 25 mg via ORAL
  Filled 2017-03-14 (×3): qty 1

## 2017-03-14 MED ORDER — CHLORDIAZEPOXIDE HCL 25 MG PO CAPS
25.0000 mg | ORAL_CAPSULE | Freq: Four times a day (QID) | ORAL | Status: AC | PRN
  Administered 2017-03-14 – 2017-03-16 (×3): 25 mg via ORAL
  Filled 2017-03-14 (×3): qty 1

## 2017-03-14 MED ORDER — LORAZEPAM 2 MG/ML IJ SOLN: 0.0000 mg | Freq: Two times a day (BID) | INTRAMUSCULAR | Status: DC

## 2017-03-14 MED ORDER — LORAZEPAM 2 MG/ML IJ SOLN: 0.0000 mg | Freq: Four times a day (QID) | INTRAMUSCULAR | Status: DC

## 2017-03-14 MED ORDER — MAGNESIUM HYDROXIDE 400 MG/5ML PO SUSP: 30.0000 mL | Freq: Every day | ORAL | Status: DC | PRN

## 2017-03-14 MED ORDER — THIAMINE HCL 100 MG/ML IJ SOLN: 100.0000 mg | Freq: Every day | INTRAMUSCULAR | Status: DC

## 2017-03-14 MED ORDER — ADULT MULTIVITAMIN W/MINERALS CH
1.0000 | ORAL_TABLET | Freq: Every day | ORAL | Status: DC
  Administered 2017-03-14 – 2017-03-19 (×6): 1 via ORAL
  Filled 2017-03-14 (×11): qty 1

## 2017-03-14 MED ORDER — LORAZEPAM 1 MG PO TABS: 0.0000 mg | ORAL_TABLET | Freq: Two times a day (BID) | ORAL | Status: DC

## 2017-03-14 MED ORDER — ALUM & MAG HYDROXIDE-SIMETH 200-200-20 MG/5ML PO SUSP: 30.0000 mL | ORAL | Status: DC | PRN

## 2017-03-14 NOTE — ED Notes (Signed)
ED Provider at bedside. 

## 2017-03-14 NOTE — ED Triage Notes (Signed)
Pt states "I'm tired of my life, I'm tired of everything, just wish I weren't here anymore"  Pt admits to drug and alcohol abuse.  Pt admits to using suboxone, crack use, alcohol use and klonopin, states used today.  Pt states he wants help and has had SI thoughts of killing himself by train.  Pt denies HI

## 2017-03-14 NOTE — Progress Notes (Addendum)
Per Jeffery Hancock , [facility] ; Accepting provider is Assunta FoundShuvon Rankin, NP; Attending provider is Dr. Jama Flavorsobos.   Patient can arrive at 12:30p. Number for report is [phone].   Jeffery Hancock, [facility] notified Jeffery AuLeslie, Consulting civil engineerCharge RN.   Jeffery DaubJolan Bronwyn Hancock, MSW, LCSWA Clinical Social Worker (Disposition) [facility]  [phone]

## 2017-03-14 NOTE — BH Assessment (Signed)
Tele Assessment Note   Patient Name: Jeffery Hancock MRN: [id number] Referring Physician: Dr. Bebe Shaggy Location of Patient: [facility] Location of Provider: Behavioral Health TTS Department  Jeffery Hancock is an 27 y.o. male.  -Clinician reviewed note by Dr. Bebe Shaggy.  Patient with history of polysubstance abuse presents with continued drug usage as well as suicidal thoughts.  He reports that he drinks alcohol, recently injected Suboxone, uses crack cocaine, also uses Klonopin.  He also reports having thoughts of harming himself by jumping in front of a train.  He was recently seen in the emergency department for substance abuse.  He feels very anxious, but denies any other pain complaints  Patient says that he is tired of life and that everything is bringing him down.  Patient says he is tired of using drugs and does not want to be "living this kind of life."  Patient had a plan to overdose on suboxone and pass out on train tracks so that a train would run over him.  He reports having taken "a bunch of benzos" last night in an effort to kill himself.  He had not had a suicide attempt prior to that until then.  Patient denies any HI or A/V hallucinations.  Patient says he uses ETOH, suboxone, cocaine on a regular basis.  He has been using cocaine daily since early December.  Patient was assessed on 03/11/17 and said he used cocaine about once a month.  He reports using suboxone once or twice in a week since he was discharged from [facility] on 02-05-17.  Pt says he has been drinking daily about two 40's per day since d/c from [facility].  Patient has no current outpatient care.  He has been at [facility] in 07/2014.  -Clinician discussed patient care with Donell Sievert, PA.  He recommends inpatient psychiatric care.  Clinician informed Dr. Bebe Shaggy of disposition.  TTS to seek placement.  Diagnosis: F33.2 MDD recurrent severe; F10.20 ETOH use d/o severe; F14.20 Cocaine use d/o severe  Past  Medical History:  Past Medical History:  Diagnosis Date  . Alcohol abuse   . Anxiety   . Depression   . Hyperlipemia   . Panic attacks   . Polysubstance abuse (HCC)    crack, oxycodone, heroin, etoh    History reviewed. No pertinent surgical history.  Family History:  Family History  Problem Relation Age of Onset  . Diabetes Other   . Hypertension Other   . Cancer Other   . Heart failure Other   . Alcohol abuse Father   . Depression Brother   . Bipolar disorder Brother     Social History:  reports that he has been smoking cigarettes.  He has been smoking about 1.00 pack per day. he has never used smokeless tobacco. He reports that he drinks alcohol. He reports that he uses drugs. Drugs: Benzodiazepines, IV, and Cocaine.  Additional Social History:  Alcohol / Drug Use Pain Medications: Pt using suboxone off the street. Prescriptions: None Over the Counter: K supplement History of alcohol / drug use?: Yes Negative Consequences of Use: Personal relationships Substance #1 Name of Substance 1: Cocaine (crack) 1 - Age of First Use: 27 years of age 11 - Amount (size/oz): Depends on finances. 1 - Frequency: Daily 1 - Duration: Since December 5 when he got out of [facility] 1 - Last Use / Amount: 01/09 Substance #2 Name of Substance 2: ETOH (beer) 2 - Age of First Use: 27 years  of age 67 - Amount (size/oz): One or two 40's per day 2 - Frequency: Daily use 2 - Duration: Since December 5 when [facility].  Off and on previously. 2 - Last Use / Amount: 01/09 Three 24 oz cans. Substance #3 Name of Substance 3: Suboxone 3 - Age of First Use: 27 years of age 53 - Amount (size/oz): 1/4 of an 8mg  strip  3 - Frequency: Two to three times per week 3 - Duration: off and on 3 - Last Use / Amount: 01/09  CIWA: CIWA-Ar BP: (!) 116/59 Pulse Rate: (!) 109 Nausea and Vomiting: no nausea and no vomiting Tactile Disturbances: none Tremor: no  tremor Auditory Disturbances: not present Paroxysmal Sweats: no sweat visible Visual Disturbances: not present Anxiety: no anxiety, at ease Headache, Fullness in Head: none present Agitation: normal activity Orientation and Clouding of Sensorium: oriented and can do serial additions CIWA-Ar Total: 0 COWS: Clinical Opiate Withdrawal Scale (COWS) Resting Pulse Rate: Pulse Rate 81-100 Sweating: No report of chills or flushing Restlessness: Able to sit still Pupil Size: Pupils pinned or normal size for room light Bone or Joint Aches: Not present Runny Nose or Tearing: Not present GI Upset: No GI symptoms Tremor: No tremor Yawning: No yawning Anxiety or Irritability: None Gooseflesh Skin: Skin is smooth COWS Total Score: 1  PATIENT STRENGTHS: (choose at least two) Ability for insight Average or above average intelligence Capable of independent living Communication skills Motivation for treatment/growth  Allergies: No Known Allergies  Home Medications:  (Not in a hospital admission)  OB/GYN Status:  No LMP for male patient.  General Assessment Data Location of Assessment: [facility] TTS Assessment: In system Is this a Tele or Face-to-Face Assessment?: Tele Assessment Is this an Initial Assessment or a Re-assessment for this encounter?: Initial Assessment Marital status: Single Is patient pregnant?: No Pregnancy Status: No Living Arrangements: Alone Can pt return to current living arrangement?: Yes Admission Status: Voluntary Is patient capable of signing voluntary admission?: Yes Referral Source: Self/Family/Friend(Pt got someone to drive him to [facility].) Insurance type: self pay     Crisis Care Plan Living Arrangements: Alone Name of Psychiatrist: None Name of Therapist: None  Education Status Is patient currently in school?: No Highest grade of school patient has completed: 12th grade  Risk to self with the past 6 months Suicidal Ideation: Yes-Currently  Present Has patient been a risk to self within the past 6 months prior to admission? : Yes Suicidal Intent: Yes-Currently Present Has patient had any suicidal intent within the past 6 months prior to admission? : No Is patient at risk for suicide?: Yes Suicidal Plan?: Yes-Currently Present Has patient had any suicidal plan within the past 6 months prior to admission? : No Specify Current Suicidal Plan: Take sedatives then lie down on train tracks Access to Means: Yes Specify Access to Suicidal Means: Drugs and trains What has been your use of drugs/alcohol within the last 12 months?: ETOH, benzos, cocaine Previous Attempts/Gestures: Yes How many times?: 1 Other Self Harm Risks: None Triggers for Past Attempts: None known Intentional Self Injurious Behavior: None Family Suicide History: No Recent stressful life event(s): Financial Problems, Job Loss, Turmoil (Comment)(Not getting along w/ roommate; drug problesms) Persecutory voices/beliefs?: No Depression: Yes Depression Symptoms: Despondent, Guilt, Insomnia, Feeling worthless/self pity, Loss of interest in usual pleasures, Isolating Substance abuse history and/or treatment for substance abuse?: Yes Suicide prevention information given to non-admitted patients: Not applicable  Risk to Others within  the past 6 months Homicidal Ideation: No Does patient have any lifetime risk of violence toward others beyond the six months prior to admission? : No Thoughts of Harm to Others: No Current Homicidal Intent: No Current Homicidal Plan: No Access to Homicidal Means: No Identified Victim: No one History of harm to others?: Yes Assessment of Violence: In distant past Violent Behavior Description: None currently Does patient have access to weapons?: No Criminal Charges Pending?: No Does patient have a court date: No Is patient on probation?: No  Psychosis Hallucinations: None noted Delusions: None noted  Mental Status  Report Appearance/Hygiene: Disheveled, Body odor, Unremarkable, In scrubs Eye Contact: Good Motor Activity: Freedom of movement, Unremarkable Speech: Logical/coherent Level of Consciousness: Alert Mood: Depressed, Anxious, Despair, Empty, Helpless, Guilty Affect: Appropriate to circumstance Anxiety Level: Panic Attacks Most recent panic attack: "Been awhile Thought Processes: Coherent, Relevant Judgement: Unimpaired Orientation: Person, Place, Time, Situation Obsessive Compulsive Thoughts/Behaviors: None  Cognitive Functioning Concentration: Poor Memory: Remote Intact, Recent Intact IQ: Average Insight: Poor Impulse Control: Poor Appetite: Poor Weight Loss: 0(Haven't eatern much in last 3 dayus) Weight Gain: 0 Sleep: Decreased Total Hours of Sleep: (6-8 hours on a normal night) Vegetative Symptoms: None  ADLScreening [facility] Assessment Services) Patient's cognitive ability adequate to safely complete daily activities?: Yes Patient able to express need for assistance with ADLs?: Yes Independently performs ADLs?: Yes (appropriate for developmental age)  Prior Inpatient Therapy Prior Inpatient Therapy: Yes Prior Therapy Dates: November 2018, 2017, 2016, 2015 Prior Therapy Facilty/Provider(s): [facility] and [facility] Reason for Treatment: SA, anxiety  Prior Outpatient Therapy Prior Outpatient Therapy: No Prior Therapy Dates: N/A Prior Therapy Facilty/Provider(s): N/A Reason for Treatment: N/A Does patient have an ACCT team?: No Does patient have Intensive In-House Services?  : No Does patient have [facility] services? : No Does patient have P4CC services?: No  ADL Screening (condition at time of admission) Patient's cognitive ability adequate to safely complete daily activities?: Yes Is the patient deaf or have difficulty hearing?: No Does the patient have difficulty seeing, even when wearing glasses/contacts?: No(Wears glasses.  ) Does the patient have  difficulty concentrating, remembering, or making decisions?: No Patient able to express need for assistance with ADLs?: Yes Does the patient have difficulty dressing or bathing?: No Independently performs ADLs?: Yes (appropriate for developmental age) Does the patient have difficulty walking or climbing stairs?: No Weakness of Legs: None Weakness of Arms/Hands: None       Abuse/Neglect Assessment (Assessment to be complete while patient is alone) Physical Abuse: Denies Verbal Abuse: Denies Sexual Abuse: Denies Exploitation of patient/patient's resources: Denies Self-Neglect: Denies     Merchant navy officer (For Healthcare) Does Patient Have a Medical Advance Directive?: No Would patient like information on creating a medical advance directive?: No - Patient declined    Additional Information 1:1 In Past 12 Months?: No CIRT Risk: No Elopement Risk: No Does patient have medical clearance?: Yes     Disposition:  Disposition Initial Assessment Completed for this Encounter: Yes Disposition of Patient: Inpatient treatment program, Referred to Type of inpatient treatment program: Adult Patient referred to: Other (Comment)(Reviewed by PA)  This service was provided via telemedicine using a 2-way, interactive audio and video technology.  Names of all persons participating in this telemedicine service and their role in this encounter. Name:  Role:   Name:  Role:   Name:  Role:   Name:  Role:     Alexandria Lodge 03/14/2017 3:58 AM

## 2017-03-14 NOTE — Progress Notes (Signed)
D   Pt visible in the dayroom   He interacts minimally but appropriately with others    Pt has had no complaints up to present and has not complained of withdrawal symptoms A   Verbal support given   Medications offered and evaluated for effectiveness monitored    Q 15 min checks R   Pt is safe at present

## 2017-03-14 NOTE — Progress Notes (Signed)
Patient stated having an okay day. Patient's goal for today was to come [facility] from [facility].

## 2017-03-14 NOTE — ED Provider Notes (Signed)
[facility] EMERGENCY DEPARTMENT Provider Note   CSN: [id number] Arrival date & time: 03/14/17  0018     History   Chief Complaint Chief Complaint  Patient presents with  . V70.1    HPI Jeffery Hancock is a 27 y.o. male.  The history is provided by the patient and a parent.  Mental Health Problem  Presenting symptoms: suicidal thoughts   Patient accompanied by:  Parent Degree of incapacity (severity):  Severe Onset quality:  Gradual Timing:  Constant Progression:  Worsening Chronicity:  Recurrent Context: alcohol use and drug abuse   Relieved by:  Nothing Worsened by:  Nothing Associated symptoms: anxiety   Associated symptoms: no abdominal pain   Patient with history of polysubstance abuse presents with continued drug usage as well as suicidal thoughts He reports that he drinks alcohol, recently injected Suboxone, uses crack cocaine, also uses Klonopin. He also reports having thoughts of harming himself by jumping in front of a train He was recently seen in the emergency department for substance abuse He feels very anxious, but denies any other pain complaints  Past Medical History:  Diagnosis Date  . Alcohol abuse   . Anxiety   . Depression   . Hyperlipemia   . Panic attacks   . Polysubstance abuse (HCC)    crack, oxycodone, heroin, etoh    Patient Active Problem List   Diagnosis Date Noted  . MDD (major depressive disorder), recurrent severe, without psychosis (HCC) 08/02/2014  . Alcohol use disorder, severe, dependence (HCC) 08/02/2014  . Alcohol withdrawal (HCC) 08/02/2014  . 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 Cocaine use disorder, moderate, dependence (HCC) 08/02/2014    History reviewed. No pertinent surgical history.     Home Medications    Prior to Admission medications   Medication Sig Start Date End Date Taking? Authorizing Provider  albuterol (PROVENTIL  HFA;VENTOLIN HFA) 108 (90 Base) MCG/ACT inhaler Inhale 1-2 puffs into the lungs every 6 (six) hours as needed for wheezing or shortness of breath.   Yes [provider]    Family History Family History  Problem Relation Age of Onset  . Diabetes Other   . Hypertension Other   . Cancer Other   . Heart failure Other   . Alcohol abuse Father   . Depression Brother   . Bipolar disorder Brother     Social History Social History   Tobacco Use  . Smoking status: Current Some Day Smoker    Packs/day: 1.00    Types: Cigarettes  . Smokeless tobacco: Never Used  Substance Use Topics  . Alcohol use: Yes    Comment: drinks daily- 4-5 40 oz of beer  . Drug use: Yes    Types: Benzodiazepines, IV, Cocaine    Comment: heroin     Allergies   Patient has no known allergies.   Review of Systems Review of Systems  Constitutional: Negative for fever.  Gastrointestinal: Negative for abdominal pain.  Psychiatric/Behavioral: Positive for suicidal ideas. The patient is nervous/anxious.   All other systems reviewed and are negative.    Physical Exam Updated Vital Signs BP 137/89   Pulse (!) 133   Temp 98.1 F (36.7 C) (Oral)   Resp 20   Ht 1.829 m (6')   Wt 122.5 kg (270 lb)   SpO2 96%   BMI 36.62 kg/m   Physical Exam CONSTITUTIONAL: Disheveled, anxious HEAD: Normocephalic/atraumatic EYES: EOMI/PERRL, pupils pinpoint ENMT: Mucous membranes moist  NECK: supple no meningeal signs SPINE/BACK:entire spine nontender CV: S1/S2 noted, no murmurs/rubs/gallops noted, tachycardic LUNGS: Lungs are clear to auscultation bilaterally, no apparent distress ABDOMEN: soft, nontender GU:no cva tenderness NEURO: Pt is awake/alert/appropriate, moves all extremitiesx4.  No facial droop.   EXTREMITIES: pulses normal/equal, full ROM, track marks noted arms, but no signs of abscess or cellulitis SKIN: warm, color normal PSYCH: Anxious ED Treatments / Results  Labs (all labs ordered are  listed, but only abnormal results are displayed) Labs Reviewed  COMPREHENSIVE METABOLIC PANEL - Abnormal; Notable for the following components:      Result Value   CO2 20 (*)    Glucose, Bld 117 (*)    Total Protein 8.7 (*)    Total Bilirubin 0.2 (*)    Anion gap 16 (*)    All other components within normal limits  RAPID URINE DRUG SCREEN, HOSP PERFORMED - Abnormal; Notable for the following components:   Cocaine POSITIVE (*)    Benzodiazepines POSITIVE (*)    All other components within normal limits  ACETAMINOPHEN LEVEL - Abnormal; Notable for the following components:   Acetaminophen (Tylenol), Serum <10 (*)    All other components within normal limits  ETHANOL - Abnormal; Notable for the following components:   Alcohol, Ethyl (B) 52 (*)    All other components within normal limits  CBC WITH DIFFERENTIAL/PLATELET  SALICYLATE LEVEL    EKG  EKG Interpretation  Date/Time:  Thursday March 14 2017 00:55:48 EST Ventricular Rate:  117 PR Interval:  140 QRS Duration: 84 QT Interval:  328 QTC Calculation: 457 R Axis:   69 Text Interpretation:  Sinus tachycardia Possible Inferior infarct , age undetermined Abnormal ECG Confirmed by [name] ([id number]) on 03/14/2017 1:05:59 AM       Radiology No results found.  Procedures Procedures (including critical care time)  Medications Ordered in ED Medications  LORazepam (ATIVAN) injection 0-4 mg (not administered)    Or  LORazepam (ATIVAN) tablet 0-4 mg (not administered)  LORazepam (ATIVAN) injection 0-4 mg (not administered)    Or  LORazepam (ATIVAN) tablet 0-4 mg (not administered)  thiamine (VITAMIN B-1) tablet 100 mg (not administered)    Or  thiamine (B-1) injection 100 mg (not administered)  sodium chloride 0.9 % bolus 1,000 mL (0 mLs Intravenous Stopped 03/14/17 0209)  LORazepam (ATIVAN) injection 2 mg (2 mg Intravenous Given 03/14/17 0055)     Initial Impression / Assessment and Plan / ED Course  I have  reviewed the triage vital signs and the nursing notes.  Pertinent labs  results that were available during my care of the patient were reviewed by me and considered in my medical decision making (see chart for details).     12:43 AM Patient in the ED with polysubstance abuse as well as thoughts of suicidality Plan to initiate medical clearance, will also need IV fluids and IV Ativan due to significant tachycardia He will likely need to be admitted due to suicidal ideation 2:19 AM Vitals improved BP (!) 116/59 (BP Location: Left Arm)   Pulse (!) 109   Temp (!) 97.5 F (36.4 C) (Oral)   Resp 20   Ht 1.829 m (6')   Wt 122.5 kg (270 lb)   SpO2 100%   BMI 36.62 kg/m  Pt medically stable Awaiting psych evaluation  Final Clinical Impressions(s) / ED Diagnoses   Final diagnoses:  Polysubstance abuse (HCC)  Suicidal ideation    ED Discharge Orders    None  Zadie RhineWickline, Willis Holquin, MD 03/14/17 [phone]

## 2017-03-14 NOTE — ED Notes (Signed)
Security at bedside for second wanding aftter pt has changed into purple scrubs.

## 2017-03-14 NOTE — Progress Notes (Signed)
Pt was given tray this morning, stated he didn't want to eat. Pt did drink fluids from his tray.

## 2017-03-14 NOTE — Tx Team (Signed)
Initial Treatment Plan 03/14/2017 4:12 PM Jeffery JaegerMark Hancock RUE:[id number]    PATIENT STRESSORS: Financial difficulties Occupational concerns Substance abuse   PATIENT STRENGTHS: Average or above average intelligence Capable of independent living Communication skills Physical Health Supportive family/friends   PATIENT IDENTIFIED PROBLEMS: "Work on my substance abuse issues"  "Work on my anxiety issues so I don't have to use"  Ineffective coping  Depression  Polysubstance abuse  Suicidal Ideation           DISCHARGE CRITERIA:  Improved stabilization in mood, thinking, and/or behavior Need for constant or close observation no longer present Verbal commitment to aftercare and medication compliance Withdrawal symptoms are absent or subacute and managed without 24-hour nursing intervention  PRELIMINARY DISCHARGE PLAN: Attend 12-step recovery group Outpatient therapy Return to previous living arrangement  PATIENT/FAMILY INVOLVEMENT: This treatment plan has been presented to and reviewed with the patient, Jeffery JaegerMark Hancock.  The patient and family have been given the opportunity to ask questions and make suggestions.  Cranford MonBeaudry, Sarha Bartelt Evans, RN 03/14/2017, 4:12 PM

## 2017-03-14 NOTE — Progress Notes (Signed)
Admission note:  Patient is a 27 yo male admitted voluntarily for polysubstance abuse and suicidal ideation with a plan to walk in front of a train.  Patient's UDS was positive for benzos and cocaine.  He states that he also took 9-10 bars (2 mg) of xanax in an overdose attempt.  He states he got the xanax "off the street."  Patient states, "I'm tired of my drug abuse issues."  Patient recently left [facility] on December 4th and immediately relapsed.  He states his drug of choice is crack cocaine and uses $20 to $200-$300 per day.  He states, "it just depends on how much money I have that day."  Patient also states he "drinks 2-3 40 ounce beers daily.  "I've had the shakes, but never had seizures."  His medical hx includes asthma, but no other pertinent medical hx.  Patient states he has good support from his family.  He lives in a private [place] in [place], [place].  Patient states he has been to rehab several times with no significant periods of sobriety.  He denies SI at this time; he denies HI/AVH.  He had a prior admit here in 2016.  Patient states he has used suboxone once/twice a week since [facility].  Patient states he was brought into [facility] by police after his mother and ex-roommate called the police.  He is pleasant and cooperative.  No significant withdrawal symptoms noted.  Patient was oriented to room and unit.

## 2017-03-14 NOTE — ED Notes (Signed)
Report given to Dot LanesKrista, [facility]. [phone]

## 2017-03-15 DIAGNOSIS — F17210 Nicotine dependence, cigarettes, uncomplicated: Secondary | ICD-10-CM

## 2017-03-15 DIAGNOSIS — F322 Major depressive disorder, single episode, severe without psychotic features: Principal | ICD-10-CM

## 2017-03-15 DIAGNOSIS — Z811 Family history of alcohol abuse and dependence: Secondary | ICD-10-CM

## 2017-03-15 DIAGNOSIS — F419 Anxiety disorder, unspecified: Secondary | ICD-10-CM

## 2017-03-15 DIAGNOSIS — Z818 Family history of other mental and behavioral disorders: Secondary | ICD-10-CM

## 2017-03-15 DIAGNOSIS — G4700 Insomnia, unspecified: Secondary | ICD-10-CM

## 2017-03-15 DIAGNOSIS — R450 Nervousness: Secondary | ICD-10-CM

## 2017-03-15 DIAGNOSIS — F1020 Alcohol dependence, uncomplicated: Secondary | ICD-10-CM

## 2017-03-15 DIAGNOSIS — Z813 Family history of other psychoactive substance abuse and dependence: Secondary | ICD-10-CM

## 2017-03-15 DIAGNOSIS — R109 Unspecified abdominal pain: Secondary | ICD-10-CM

## 2017-03-15 MED ORDER — CHLORDIAZEPOXIDE HCL 25 MG PO CAPS
25.0000 mg | ORAL_CAPSULE | ORAL | Status: AC
  Administered 2017-03-17 (×2): 25 mg via ORAL
  Filled 2017-03-15 (×2): qty 1

## 2017-03-15 MED ORDER — DULOXETINE HCL 30 MG PO CPEP
30.0000 mg | ORAL_CAPSULE | Freq: Every day | ORAL | Status: DC
  Administered 2017-03-15 – 2017-03-16 (×2): 30 mg via ORAL
  Filled 2017-03-15 (×4): qty 1

## 2017-03-15 MED ORDER — TRAZODONE HCL 50 MG PO TABS
50.0000 mg | ORAL_TABLET | Freq: Every evening | ORAL | Status: DC | PRN
  Administered 2017-03-15 – 2017-03-17 (×3): 50 mg via ORAL
  Filled 2017-03-15 (×3): qty 1

## 2017-03-15 MED ORDER — ARIPIPRAZOLE 2 MG PO TABS
2.0000 mg | ORAL_TABLET | Freq: Every day | ORAL | Status: DC
  Administered 2017-03-15 – 2017-03-19 (×5): 2 mg via ORAL
  Filled 2017-03-15 (×2): qty 1
  Filled 2017-03-15: qty 7
  Filled 2017-03-15 (×2): qty 1
  Filled 2017-03-15: qty 7
  Filled 2017-03-15 (×3): qty 1

## 2017-03-15 MED ORDER — CHLORDIAZEPOXIDE HCL 25 MG PO CAPS
25.0000 mg | ORAL_CAPSULE | Freq: Every day | ORAL | Status: AC
  Administered 2017-03-18: 25 mg via ORAL
  Filled 2017-03-15: qty 1

## 2017-03-15 MED ORDER — CHLORDIAZEPOXIDE HCL 25 MG PO CAPS
25.0000 mg | ORAL_CAPSULE | Freq: Three times a day (TID) | ORAL | Status: AC
  Administered 2017-03-16 (×3): 25 mg via ORAL
  Filled 2017-03-15 (×3): qty 1

## 2017-03-15 MED ORDER — CHLORDIAZEPOXIDE HCL 25 MG PO CAPS
25.0000 mg | ORAL_CAPSULE | Freq: Four times a day (QID) | ORAL | Status: AC
  Administered 2017-03-15 (×4): 25 mg via ORAL
  Filled 2017-03-15 (×4): qty 1

## 2017-03-15 NOTE — [facility] Group Notes (Signed)
LCSW Group Therapy Note  03/15/2017 1:15pm  Type of Therapy/Topic:  Group Therapy:  Emotion Regulation  Participation Level:  Did Not Attend--invited. Chose to remain in bed.    Description of Group:   The purpose of this group is to assist patients in learning to regulate negative emotions and experience positive emotions. Patients will be guided to discuss ways in which they have been vulnerable to their negative emotions. These vulnerabilities will be juxtaposed with experiences of positive emotions or situations, and patients will be challenged to use positive emotions to combat negative ones. Special emphasis will be placed on coping with negative emotions in conflict situations, and patients will process healthy conflict resolution skills.  Therapeutic Goals: 1. Patient will identify two positive emotions or experiences to reflect on in order to balance out negative emotions 2. Patient will label two or more emotions that they find the most difficult to experience 3. Patient will demonstrate positive conflict resolution skills through discussion and/or role plays  Summary of Patient Progress:   x    Therapeutic Modalities:   Cognitive Behavioral Therapy Feelings Identification Dialectical Behavioral Therapy   Ledell PeoplesHeather N Smart, LCSW 03/15/2017 2:22 PM

## 2017-03-15 NOTE — Progress Notes (Signed)
Pt attend AA meeting 

## 2017-03-15 NOTE — H&P (Signed)
Psychiatric Admission Assessment Adult  Patient Identification: Jeffery Hancock  MRN:  [id number]  Date of Evaluation:  03/15/2017  Chief Complaint: Threats of self-harm & suicide by cops while alcohol/drug intoxicated.  Principal Diagnosis: Substance induced mood disorder ([place])  Diagnosis:   Patient Active Problem List   Diagnosis Date Noted  . Alcohol use disorder, severe, dependence ([place]) [F10.20] 08/02/2014    Priority: High  . Substance induced mood disorder ([place]) [F19.94] 07/30/2014    Priority: High  . Cocaine use disorder, moderate, dependence ([place]) [F14.20] 08/02/2014    Priority: Medium  . MDD (major depressive disorder), severe ([place]) [F32.2] 03/14/2017  . MDD (major depressive disorder), recurrent severe, without psychosis ([place]) [F33.2] 08/02/2014  . Alcohol withdrawal ([address]) [F10.239] 08/02/2014  . 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 His reports indicated that he has dabbled in Benzodiazepine & opoid abuse (Xanax & Suboxen). He is admitted to the [facility] from the [facility] with complaints of self-harm verbiage while intoxicated. Chart review indicated that patient had wanted the cops to shoot him. This happened after an argument with his roommate. He has apparently relapsed after getting out of substance abuse program just of recent.  During this assessment, [name] reports, "I was initially taken to the [facility] on Wednesday by the cops, got discharged, but was not feeling well still. Then, my mother took me back to the hospital the same day because I needed help. I did not remember everything that happened on that day. I don't remember when I was discharged from the hospital on that day as well because I was out of it. I  know the cops came to the house after my room-mate called my mother & my mother called the cops. I was extremely intoxicated & punched the wall. I was completely blacked out of all that happened. I just got out of Rehab on 02-05-17 ([facility]) in [place]. I relapsed the day after getting discharged from the center. I don't really know why. I just felt the urge to get high & I did it. Alcohol & cocaine has been my biggest problems. I have also used Suboxen & Xanax. I have been on drugs & alcohol since age 22. My longest sobriety was 9 months total between 2016 & 2017. I was diagnosed with depression & anxiety about 5 years ago. Had tried Zoloft, made me real angry, Cymbalta helped. I'm having tremors & some anxiety now. I have a brother who died of drug overdose 4 years ago. I'm tired of living this way".  Associated Signs/Symptoms:  Depression Symptoms:  depressed mood, feelings of worthlessness/guilt, hopelessness, anxiety,  (Hypo) Manic Symptoms:  Impulsivity, Irritable Mood,  Anxiety Symptoms:  Excessive Worry,  Psychotic Symptoms:  Denies any hallucinations, delusiona or paranoia  PTSD Symptoms: Denies any PTSD symptoms or events  Total Time spent with patient: 1 hour  Past Psychiatric History: Alcohol use disorder, severe, Cocaine use disorder, severe, Major depression, Anxiety.  Is the patient at risk to self? No.  Has the patient been a risk to self in the past 6 months? Yes.    Has the patient been a risk to self within the distant past? No.  Is the patient a risk to others? No.  Has the patient been a risk  to others in the past 6 months? No.  Has the patient been a risk to others within the distant past? No.   Prior Inpatient Therapy: Yes ([facility] x 2) ([facility] x 3, [facility] x 1). Prior Outpatient Therapy: None reported.  Alcohol Screening: 1. How often do you have a drink containing alcohol?: 4 or more times a week 2. How many drinks containing alcohol do  you have on a typical day when you are drinking?: 3 or 4 3. How often do you have six or more drinks on one occasion?: Daily or almost daily AUDIT-C Score: 9 4. How often during the last year have you found that you were not able to stop drinking once you had started?: Daily or almost daily 5. How often during the last year have you failed to do what was normally expected from you becasue of drinking?: Daily or almost daily 6. How often during the last year have you needed a first drink in the morning to get yourself going after a heavy drinking session?: Daily or almost daily 7. How often during the last year have you had a feeling of guilt of remorse after drinking?: Daily or almost daily 8. How often during the last year have you been unable to remember what happened the night before because you had been drinking?: Never 9. Have you or someone else been injured as a result of your drinking?: No 10. Has a relative or friend or a doctor or another health worker been concerned about your drinking or suggested you cut down?: Yes, but not in the last year Alcohol Use Disorder Identification Test Final Score (AUDIT): 27 Intervention/Follow-up: Alcohol Education  Substance Abuse History in the last 12 months:  Yes.    Consequences of Substance Abuse: Medical Consequences:  Liver damage, Possible death by overdose Legal Consequences:  Arrests, jail time, Loss of driving privilege. Family Consequences:  Family discord, divorce and or separation.  Previous Psychotropic Medications: Yes (Sertraline & Cymbalta).  Psychological Evaluations: No   Past Medical History:  Past Medical History:  Diagnosis Date  . Alcohol abuse   . Anxiety   . Depression   . Hyperlipemia   . Panic attacks   . Polysubstance abuse (HCC)    crack, oxycodone, heroin, etoh   History reviewed. No pertinent surgical history. Family History:  Family History  Problem Relation Age of Onset  . Diabetes Other   .  Hypertension Other   . Cancer Other   . Heart failure Other   . Alcohol abuse Father   . Depression Brother   . Bipolar disorder Brother    Family Psychiatric  History: Alcoholism: Father,  Drug addiction: brother.  Tobacco Screening: Have you used any form of tobacco in the last 30 days? (Cigarettes, Smokeless Tobacco, Cigars, and/or Pipes): Yes Tobacco use, Select all that apply: 5 or more cigarettes per day Are you interested in Tobacco Cessation Medications?: No, patient refused Counseled patient on smoking cessation including recognizing danger situations, developing coping skills and basic information about quitting provided: Refused/Declined practical counseling  Social History: Single, no children, unemployed, High school graduate.  Social History   Substance and Sexual Activity  Alcohol Use Yes   Comment: drinks daily- 4-5 40 oz of beer     Social History   Substance and Sexual Activity  Drug Use Yes  . Types: Benzodiazepines, IV, Cocaine   Comment: heroin    Additional Social History:  Allergies:  No Known Allergies  Lab  Results:  Results for orders placed or performed during the hospital encounter of 03/14/17 (from the past 48 hour(s))  Comprehensive metabolic panel     Status: Abnormal   Collection Time: 03/14/17 12:34 AM  Result Value Ref Range   Sodium 142 135 - 145 mmol/L   Potassium 3.6 3.5 - 5.1 mmol/L   Chloride 106 101 - 111 mmol/L   CO2 20 (L) 22 - 32 mmol/L   Glucose, Bld 117 (H) 65 - 99 mg/dL   BUN 11 6 - 20 mg/dL   Creatinine, Ser 0.88 0.61 - 1.24 mg/dL   Calcium 9.2 8.9 - 10.3 mg/dL   Total Protein 8.7 (H) 6.5 - 8.1 g/dL   Albumin 4.6 3.5 - 5.0 g/dL   AST 33 15 - 41 U/L   ALT 37 17 - 63 U/L   Alkaline Phosphatase 76 38 - 126 U/L   Total Bilirubin 0.2 (L) 0.3 - 1.2 mg/dL   GFR calc non Af Amer >60 >60 mL/min   GFR calc Af Amer >60 >60 mL/min    Comment: (NOTE) The eGFR has been calculated using the CKD EPI equation. This calculation has  not been validated in all clinical situations. eGFR's persistently <60 mL/min signify possible Chronic Kidney Disease.    Anion gap 16 (H) 5 - 15  CBC with Differential/Platelet     Status: None   Collection Time: 03/14/17 12:34 AM  Result Value Ref Range   WBC 7.4 4.0 - 10.5 K/uL   RBC 5.40 4.22 - 5.81 MIL/uL   Hemoglobin 16.9 13.0 - 17.0 g/dL   HCT 47.8 39.0 - 52.0 %   MCV 88.5 78.0 - 100.0 fL   MCH 31.3 26.0 - 34.0 pg   MCHC 35.4 30.0 - 36.0 g/dL   RDW 13.1 11.5 - 15.5 %   Platelets 248 150 - 400 K/uL   Neutrophils Relative % 58 %   Neutro Abs 4.3 1.7 - 7.7 K/uL   Lymphocytes Relative 34 %   Lymphs Abs 2.5 0.7 - 4.0 K/uL   Monocytes Relative 7 %   Monocytes Absolute 0.5 0.1 - 1.0 K/uL   Eosinophils Relative 1 %   Eosinophils Absolute 0.1 0.0 - 0.7 K/uL   Basophils Relative 0 %   Basophils Absolute 0.0 0.0 - 0.1 K/uL  Rapid urine drug screen (hospital performed)     Status: Abnormal   Collection Time: 03/14/17 12:34 AM  Result Value Ref Range   Opiates NONE DETECTED NONE DETECTED   Cocaine POSITIVE (A) NONE DETECTED   Benzodiazepines POSITIVE (A) NONE DETECTED   Amphetamines NONE DETECTED NONE DETECTED   Tetrahydrocannabinol NONE DETECTED NONE DETECTED   Barbiturates NONE DETECTED NONE DETECTED    Comment: (NOTE) DRUG SCREEN FOR MEDICAL PURPOSES ONLY.  IF CONFIRMATION IS NEEDED FOR ANY PURPOSE, NOTIFY LAB WITHIN 5 DAYS. LOWEST DETECTABLE LIMITS FOR URINE DRUG SCREEN Drug Class                     Cutoff (ng/mL) Amphetamine and metabolites    1000 Barbiturate and metabolites    200 Benzodiazepine                 993 Tricyclics and metabolites     300 Opiates and metabolites        300 Cocaine and metabolites        300 THC  50   Salicylate level     Status: None   Collection Time: 03/14/17 12:41 AM  Result Value Ref Range   Salicylate Lvl <7.8 2.8 - 30.0 mg/dL  Acetaminophen level     Status: Abnormal   Collection Time: 03/14/17  12:41 AM  Result Value Ref Range   Acetaminophen (Tylenol), Serum <10 (L) 10 - 30 ug/mL    Comment:        THERAPEUTIC CONCENTRATIONS VARY SIGNIFICANTLY. A RANGE OF 10-30 ug/mL MAY BE AN EFFECTIVE CONCENTRATION FOR MANY PATIENTS. HOWEVER, SOME ARE BEST TREATED AT CONCENTRATIONS OUTSIDE THIS RANGE. ACETAMINOPHEN CONCENTRATIONS >150 ug/mL AT 4 HOURS AFTER INGESTION AND >50 ug/mL AT 12 HOURS AFTER INGESTION ARE OFTEN ASSOCIATED WITH TOXIC REACTIONS.   Ethanol     Status: Abnormal   Collection Time: 03/14/17 12:41 AM  Result Value Ref Range   Alcohol, Ethyl (B) 52 (H) <10 mg/dL    Comment:        LOWEST DETECTABLE LIMIT FOR SERUM ALCOHOL IS 10 mg/dL FOR MEDICAL PURPOSES ONLY    Blood Alcohol level:  Lab Results  Component Value Date   ETH 52 (H) 03/14/2017   ETH 19 (H) 93/81/0175   Metabolic Disorder Labs:  No results found for: HGBA1C, MPG No results found for: PROLACTIN No results found for: CHOL, TRIG, HDL, CHOLHDL, VLDL, LDLCALC  Current Medications: Current Facility-Administered Medications  Medication Dose Route Frequency Provider Last Rate Last Dose  . acetaminophen (TYLENOL) tablet 650 mg  650 mg Oral Q6H PRN Rankin, Shuvon B, NP      . albuterol (PROVENTIL HFA;VENTOLIN HFA) 108 (90 Base) MCG/ACT inhaler 1-2 puff  1-2 puff Inhalation Q4H PRN Nwoko, Agnes I, NP      . alum & mag hydroxide-simeth (MAALOX/MYLANTA) 200-200-20 MG/5ML suspension 30 mL  30 mL Oral Q4H PRN Rankin, Shuvon B, NP      . ARIPiprazole (ABILIFY) tablet 2 mg  2 mg Oral Daily Nwoko, Agnes I, NP      . chlordiazePOXIDE (LIBRIUM) capsule 25 mg  25 mg Oral Q6H PRN Rankin, Shuvon B, NP   25 mg at 03/15/17 0820  . chlordiazePOXIDE (LIBRIUM) capsule 25 mg  25 mg Oral QID Cobos, Myer Peer, MD   25 mg at 03/15/17 1201   Followed by  . [START ON 03/16/2017] chlordiazePOXIDE (LIBRIUM) capsule 25 mg  25 mg Oral TID Cobos, Myer Peer, MD       Followed by  . [START ON 03/17/2017] chlordiazePOXIDE (LIBRIUM)  capsule 25 mg  25 mg Oral BH-qamhs Cobos, Myer Peer, MD       Followed by  . [START ON 03/18/2017] chlordiazePOXIDE (LIBRIUM) capsule 25 mg  25 mg Oral Daily Cobos, Fernando A, MD      . DULoxetine (CYMBALTA) DR capsule 30 mg  30 mg Oral Daily Nwoko, Agnes I, NP      . hydrOXYzine (ATARAX/VISTARIL) tablet 25 mg  25 mg Oral Q6H PRN Rankin, Shuvon B, NP   25 mg at 03/14/17 1838  . loperamide (IMODIUM) capsule 2-4 mg  2-4 mg Oral PRN Rankin, Shuvon B, NP      . magnesium hydroxide (MILK OF MAGNESIA) suspension 30 mL  30 mL Oral Daily PRN Rankin, Shuvon B, NP      . multivitamin with minerals tablet 1 tablet  1 tablet Oral Daily Rankin, Shuvon B, NP   1 tablet at 03/15/17 0820  . nicotine (NICODERM CQ - dosed in mg/24 hours) patch 21 mg  21 mg  Transdermal Daily Lindell Spar I, NP   21 mg at 03/15/17 0819  . ondansetron (ZOFRAN-ODT) disintegrating tablet 4 mg  4 mg Oral Q6H PRN Rankin, Shuvon B, NP      . thiamine (B-1) injection 100 mg  100 mg Intramuscular Once Rankin, Shuvon B, NP      . thiamine (VITAMIN B-1) tablet 100 mg  100 mg Oral Daily Rankin, Shuvon B, NP   100 mg at 03/15/17 0820   PTA Medications: Medications Prior to Admission  Medication Sig Dispense Refill Last Dose  . albuterol (PROVENTIL HFA;VENTOLIN HFA) 108 (90 Base) MCG/ACT inhaler Inhale 1-2 puffs into the lungs every 6 (six) hours as needed for wheezing or shortness of breath.   unknown   Musculoskeletal: Strength & Muscle Tone: within normal limits Gait & Station: normal Patient leans: N/A  Psychiatric Specialty Exam: Physical Exam  Constitutional: He appears well-developed.  HENT:  Head: Normocephalic.  Eyes: Pupils are equal, round, and reactive to light.  Neck: Normal range of motion.  Cardiovascular: Normal rate.  Respiratory: Effort normal.  GI: Soft.  Genitourinary:  Genitourinary Comments: Deferred  Musculoskeletal: Normal range of motion.  Neurological: He is alert.  Skin: Skin is warm.    Review of  Systems  Constitutional: Positive for chills, diaphoresis and malaise/fatigue.  HENT: Negative.   Eyes: Negative.   Respiratory: Negative.   Cardiovascular: Negative.   Gastrointestinal: Positive for abdominal pain.  Genitourinary: Negative.   Musculoskeletal: Negative.   Skin: Negative.   Neurological: Negative.   Endo/Heme/Allergies: Negative.   Psychiatric/Behavioral: Positive for depression and substance abuse (BAL 52, UDS (+) for Benzodizaepine & Cocaine). Negative for memory loss and suicidal ideas. The patient is nervous/anxious and has insomnia.     Blood pressure 129/78, pulse 70, temperature 98.9 F (37.2 C), resp. rate 16, height 6' (1.829 m), weight 122.5 kg (270 lb).Body mass index is 36.62 kg/m.  General Appearance: Casual and Fairly Groomed  Eye Contact:  Good  Speech:  Clear and Coherent and Normal Rate  Volume:  Normal  Mood:  Anxious, Depressed, Hopeless and Worthless  Affect:  Restricted  Thought Process:  Coherent, Goal Directed and Descriptions of Associations: Intact  Orientation:  Full (Time, Place, and Person)  Thought Content:  Logical, denies any hallucinations, delusions or paranoia.  Suicidal Thoughts:  Currently denies any thoughts, plans or intent  Homicidal Thoughts:  Denies  Memory:  Immediate;   Good Recent;   Good Remote;   Good  Judgement:  Good  Insight:  Present  Psychomotor Activity:  Tremor, Some anxiety  Concentration:  Concentration: Good and Attention Span: Good  Recall:  Good  Fund of Knowledge:  Good  Language:  Good  Akathisia:  Negative  Handed:  Right  AIMS (if indicated):     Assets:  Communication Skills Desire for Improvement Physical Health Social Support  ADL's:  Intact  Cognition:  WNL  Sleep:  Number of Hours: 6.5   Treatment Plan Summary: Treatment Plan/Recommendations: 1. Admit for crisis management and stabilization, estimated length of stay 3-5 days.   2. Medication management to reduce current symptoms to  base line and improve the patient's overall level of functioning: See MAR, Md's SRA & treatment plan.   3. Treat health problems as indicated.  4. Develop treatment plan to decrease risk of relapse upon discharge and the need for readmission.  5. Psycho-social education regarding relapse prevention and self care.  6. Health care follow up as needed for medical problems.  7. Review, reconcile, and reinstate any pertinent home medications for other health issues where appropriate. 8. Call for consults with hospitalist for any additional specialty patient care services as needed.  Observation Level/Precautions:  15 minute checks  Laboratory:  Per ED, BAL 52, UDS (+) for Benzodiazepine & Cocaine  Psychotherapy: Group sessions  Medications: See MAR   Consultations: As needed   Discharge Concerns: Mood stability, Maintaining sobriety  Estimated LOS: 2-4 days  Other: Admit to the [facility].    Physician Treatment Plan for Primary Diagnosis: Substance induced mood disorder ([place])  Long Term Goal(s): Improvement in symptoms so as ready for discharge  Short Term Goals: Ability to identify changes in lifestyle to reduce recurrence of condition will improve and Ability to demonstrate self-control will improve  Physician Treatment Plan for Secondary Diagnosis: Principal Problem:   Substance induced mood disorder ([place]) Active Problems:   Alcohol use disorder, severe, dependence ([place])   MDD (major depressive disorder), severe ([place])  Long Term Goal(s): Improvement in symptoms so as ready for discharge  Short Term Goals: Ability to identify and develop effective coping behaviors will improve, Compliance with prescribed medications will improve and Ability to identify triggers associated with substance abuse/mental health issues will improve  I certify that inpatient services furnished can reasonably be expected to improve the patient's condition.    Lindell Spar, NP, PMHNP, FNP-BC. 1/11/20191:09 PM    I have reviewed NP's Note, assessement, diagnosis and plan, and agree. I have also met with patient and completed suicide risk assessment.  Omarr Hann is a 27 y/o M with history of MDD, alcohol use disorder, and cocaine use disorder who was admitted voluntarily with worsening depression, SI with plan to jump in front of a train, and worsening use of cocaine and alcohol.  Upon interview, pt shares his reasons for coming to the hospital, stating, "I just need to get my shit straight. I just got out of rehab December 4th and I started going downhill real fast. I've been to three 28-day programs and I feel like I need something more long-term now." Pt had initially been brought in by police after his roommate had called them when pt was intoxicated and punching the walls; when police arrived he told them that he wanted them to shoot him. Pt reports he has been having vague SI, but he denies having a plan or intent. He denies HI/AH/VH. He reports poor sleep, anhedonia, and guilty feelings. He denies symptoms of mania, OCD, and PTSD. He rpeorts using cocaine and alcohol regularly (up to a case a day of alcohol) and he uses xanax and suboxone when he can get it on the street.  Discussed with patient about treatment options. He would like to be restarted on duloxetine which he reports is the only medication which has been helpful for him in the past. Discussed with patient about trial of abilify as well at low dose, and he was in agreement. Pt will also be placed on CIWA protocol and monitored for withdrawal. He will meet with SW team to discuss potential substance use treatment referrals. Pt was in agreement with the above plan, and he had no further questions, comments, or concerns.  PLAN OF CARE:   - Admit to inpatient psychiatry unit  - MDD             - Start duloxetine 39m po qDay             - Start abilify 2 mg po qDay  -Alcohol  use disorder/withdrawal             - Start CIWA with  librium  -anxiety             - Start atarax 65m po q8h prn anxiety  -insomnia             - Start trazodone 532mpo qhs prn insomnia  -Encourage participation in groups and the therapeutic milieu  -Discharge planning will be ongoing   ChMaris BergerMD
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
## 2017-03-15 NOTE — Progress Notes (Signed)
Recreation Therapy Notes  Date:  03/15/17 Time: 0930 Location: 300 Hall Group Room  Group Topic: Stress Management  Goal Area(s) Addresses:  Patient will verbalize importance of using healthy stress management.  Patient will identify positive emotions associated with healthy stress management.  Intervention: Stress Management  Activity :  Progressive Muscle Relaxation.  LRT introduced the stress management technique of progressive muscle relaxation.  LRT read a script to guide patients through the technique which allowed them to tense and relax each muscle group individually.  Education: Stress Management, Discharge Planning.   Education Outcome: Acknowledges edcuation/In group clarification offered/Needs additional education  Clinical Observations/Feedback: Pt did not attend group.     Jessamyn Watterson, LRT/CTRS          Ivianna Notch A 03/15/2017 12:06 PM 

## 2017-03-15 NOTE — [facility] Suicide Risk Assessment (Signed)
[facility] INPATIENT:  Family/Significant Other Suicide Prevention Education  Suicide Prevention Education:  Education Completed; Jeffery Hancock (mother, [phone]) has been identified by the patient as the family member/significant other with whom the patient will be residing, and identified as the person(s) who will aid the patient in the event of a mental health crisis (suicidal ideations/suicide attempt).  With written consent from the patient, the family member/significant other has been provided the following suicide prevention education, prior to the and/or following the discharge of the patient.  The suicide prevention education provided includes the following:  Suicide risk factors  Suicide prevention and interventions  National Suicide Hotline telephone number  [facility] assessment telephone number  [facility] Emergency Assistance 911  [facility] and/or Residential Mobile Crisis Unit telephone number  Request made of family/significant other to:  Remove weapons (e.g., guns, rifles, knives), all items previously/currently identified as safety concern.    Remove drugs/medications (over-the-counter, prescriptions, illicit drugs), all items previously/currently identified as a safety concern.  The family member/significant other verbalizes understanding of the suicide prevention education information provided.  The family member/significant other agrees to remove the items of safety concern listed above.  Jeffery Hancock 03/15/2017, 9:53 AM

## 2017-03-15 NOTE — Tx Team (Signed)
Interdisciplinary Treatment and Diagnostic Plan Update  03/15/2017 Time of Session: 0830AM Jeffery Hancock MRN: [id number]  Principal Diagnosis: MDD, recurrent, severe  Secondary Diagnoses: Active Problems:   MDD (major depressive disorder), severe (HCC)   Current Medications:  Current Facility-Administered Medications  Medication Dose Route Frequency Provider Last Rate Last Dose  . acetaminophen (TYLENOL) tablet 650 mg  650 mg Oral Q6H PRN Rankin, Shuvon B, NP      . albuterol (PROVENTIL HFA;VENTOLIN HFA) 108 (90 Base) MCG/ACT inhaler 1-2 puff  1-2 puff Inhalation Q4H PRN Nwoko, Agnes I, NP      . alum & mag hydroxide-simeth (MAALOX/MYLANTA) 200-200-20 MG/5ML suspension 30 mL  30 mL Oral Q4H PRN Rankin, Shuvon B, NP      . chlordiazePOXIDE (LIBRIUM) capsule 25 mg  25 mg Oral Q6H PRN Rankin, Shuvon B, NP   25 mg at 03/15/17 0820  . hydrOXYzine (ATARAX/VISTARIL) tablet 25 mg  25 mg Oral Q6H PRN Rankin, Shuvon B, NP   25 mg at 03/14/17 1838  . loperamide (IMODIUM) capsule 2-4 mg  2-4 mg Oral PRN Rankin, Shuvon B, NP      . magnesium hydroxide (MILK OF MAGNESIA) suspension 30 mL  30 mL Oral Daily PRN Rankin, Shuvon B, NP      . multivitamin with minerals tablet 1 tablet  1 tablet Oral Daily Rankin, Shuvon B, NP   1 tablet at 03/15/17 0820  . nicotine (NICODERM CQ - dosed in mg/24 hours) patch 21 mg  21 mg Transdermal Daily Lindell Spar I, NP   21 mg at 03/15/17 0819  . ondansetron (ZOFRAN-ODT) disintegrating tablet 4 mg  4 mg Oral Q6H PRN Rankin, Shuvon B, NP      . thiamine (B-1) injection 100 mg  100 mg Intramuscular Once Rankin, Shuvon B, NP      . thiamine (VITAMIN B-1) tablet 100 mg  100 mg Oral Daily Rankin, Shuvon B, NP   100 mg at 03/15/17 0820   PTA Medications: Medications Prior to Admission  Medication Sig Dispense Refill Last Dose  . albuterol (PROVENTIL HFA;VENTOLIN HFA) 108 (90 Base) MCG/ACT inhaler Inhale 1-2 puffs into the lungs every 6 (six) hours as needed for wheezing or  shortness of breath.   unknown    Patient Stressors: Financial difficulties Occupational concerns Substance abuse  Patient Strengths: Average or above average intelligence Capable of independent living Communication skills Physical Health Supportive family/friends  Treatment Modalities: Medication Management, Group therapy, Case management,  1 to 1 session with clinician, Psychoeducation, Recreational therapy.   Physician Treatment Plan for Primary Diagnosis: MDD, recurrent, severe  Medication Management: Evaluate patient's response, side effects, and tolerance of medication regimen.  Therapeutic Interventions: 1 to 1 sessions, Unit Group sessions and Medication administration.  Evaluation of Outcomes: Not Met  Physician Treatment Plan for Secondary Diagnosis: Active Problems:   MDD (major depressive disorder), severe ([facility])   Medication Management: Evaluate patient's response, side effects, and tolerance of medication regimen.  Therapeutic Interventions: 1 to 1 sessions, Unit Group sessions and Medication administration.  Evaluation of Outcomes: Not Met   RN Treatment Plan for Primary Diagnosis: MDD, recurrent, severe Long Term Goal(s): Knowledge of disease and therapeutic regimen to maintain health will improve  Short Term Goals: Ability to remain free from injury will improve, Ability to disclose and discuss suicidal ideas and Ability to identify and develop effective coping behaviors will improve  Medication Management: RN will administer medications as ordered by provider, will assess and evaluate patient's response and  provide education to patient for prescribed medication. RN will report any adverse and/or side effects to prescribing provider.  Therapeutic Interventions: 1 on 1 counseling sessions, Psychoeducation, Medication administration, Evaluate responses to treatment, Monitor vital signs and CBGs as ordered, Perform/monitor CIWA, COWS, AIMS and Fall Risk  screenings as ordered, Perform wound care treatments as ordered.  Evaluation of Outcomes: Not Met   LCSW Treatment Plan for Primary Diagnosis: MDD, recurrent, severe Long Term Goal(s): Safe transition to appropriate next level of care at discharge, Engage patient in therapeutic group addressing interpersonal concerns.  Short Term Goals: Engage patient in aftercare planning with referrals and resources, Facilitate patient progression through stages of change regarding substance use diagnoses and concerns and Identify triggers associated with mental health/substance abuse issues  Therapeutic Interventions: Assess for all discharge needs, 1 to 1 time with Social worker, Explore available resources and support systems, Assess for adequacy in community support network, Educate family and significant other(s) on suicide prevention, Complete Psychosocial Assessment, Interpersonal group therapy.  Evaluation of Outcomes: Not Met   Progress in Treatment: Attending groups: No.  Participating in groups: No. New to unit. Continuing to assess.  Taking medication as prescribed: Yes. Toleration medication: Yes. Family/Significant other contact made: No, will contact:  family member if patient consents to collateral contact.  Patient understands diagnosis: Yes. Discussing patient identified problems/goals with staff: Yes. Medical problems stabilized or resolved: Yes. Denies suicidal/homicidal ideation: Yes. Issues/concerns per patient self-inventory: No. Other: n/a   New problem(s) identified: No, Describe:  n/a  New Short Term/Long Term Goal(s): detox, medication management for mood stabilization; elimination of SI thoughts; development of comprehensive mental wellness/sobriety plan.   Discharge Plan or Barriers: CSW assessing for appropriate referrals. He recently discharged from [facility] in early Dec 2018. May have lost his insurance.   Reason for Continuation of Hospitalization:  Anxiety Depression Medication stabilization Suicidal ideation Withdrawal symptoms  Estimated Length of Stay: Monday, 03/18/17  Attendees: Patient: 03/15/2017 9:10 AM  Physician: Dr. Nancy Fetter MD; Dr. Parke Poisson MD 03/15/2017 9:10 AM  Nursing: Chrys Racer RN; Jinny Sanders RN 03/15/2017 9:10 AM  RN Care Manager: Lars Pinks CM 03/15/2017 9:10 AM  Social Worker: Press photographer, LCSW 03/15/2017 9:10 AM  Recreational Therapist: x 03/15/2017 9:10 AM  Other: Lindell Spar NP; Darnelle Maffucci Money NP 03/15/2017 9:10 AM  Other:  03/15/2017 9:10 AM  Other: 03/15/2017 9:10 AM    Scribe for Treatment Team: Kimber Relic Smart, LCSW 03/15/2017 9:10 AM

## 2017-03-15 NOTE — Progress Notes (Signed)
D: Patient continues to detox from alcohol and other substances.  He is on the librium protocol and is glad he is receiving it scheduled.  He states, "I have a high tolerance, but it does help me feel better."  He denies any current thoughts of self harm.  He has been observed in the day room interacting well with his peers.  He is pleasant and staff.  He appears vested in his treatment.  He states, "I have been through this many times."  He rates his depression as a 5; hopelessness as an 8 and anxiety as a 9.  He is sleeping fair; his appetite is good; his energy level is normal and his concentration is good.  His goal today is to "work on my anxiety."    A: Continue to monitor medication management and MD orders.  Safety checks continued every 15 minutes per protocol.  Offer support and encouragement as needed.  R: Patient is receptive to staff; his behavior is appropriate.

## 2017-03-15 NOTE — Progress Notes (Signed)
[facility] referral faxed 03/15/2017 2:23 PM   Trula SladeHeather Smart, MSW, LCSW Clinical Social Worker 03/15/2017 2:23 PM

## 2017-03-15 NOTE — Progress Notes (Signed)
D   Pt visible in the dayroom   He interacts minimally but appropriately with others    Pt has had no complaints up to present    Pt reports some mild withdrawal symptoms but the medication is helping   Pt attends groups  A   Verbal support given   Medications offered and evaluated for effectiveness monitored    Q 15 min checks R   Pt is safe at present
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
## 2017-03-16 DIAGNOSIS — F1010 Alcohol abuse, uncomplicated: Secondary | ICD-10-CM

## 2017-03-16 DIAGNOSIS — F39 Unspecified mood [affective] disorder: Secondary | ICD-10-CM

## 2017-03-16 DIAGNOSIS — F1994 Other psychoactive substance use, unspecified with psychoactive substance-induced mood disorder: Secondary | ICD-10-CM

## 2017-03-16 MED ORDER — DULOXETINE HCL 60 MG PO CPEP
60.0000 mg | ORAL_CAPSULE | Freq: Every day | ORAL | Status: DC
  Administered 2017-03-17 – 2017-03-19 (×3): 60 mg via ORAL
  Filled 2017-03-16 (×3): qty 1
  Filled 2017-03-16: qty 7
  Filled 2017-03-16: qty 1
  Filled 2017-03-16: qty 7

## 2017-03-16 NOTE — Progress Notes (Signed)
[facility] MD Progress Note  03/16/2017 12:45 PM Jeffery Hancock  MRN:  [id number]   Subjective:  Patient reports that he is doing much better today. He denies any medication side effects. He denies any SI/HI/AVH and contracts for safety. He reports some withdrawal symptoms but minor. He is wanting to go to a long term treatment facility, 1-2 years. He is contacting the places himself and will notify the staff of the resources. He is needing the stay for continued sobriety and detox.  Objective: Patient's chart and findings reviewed and discussed with treatment team. Patient presents in the day room interacting appropriately. He is pleasant and cooperative. He has been attending groups . He has been med compliant. Will increase the Cymbalta to 60 mg Daily for a more therapeutic dose.  Principal Problem: Substance induced mood disorder (HCC) Diagnosis:   Patient Active Problem List   Diagnosis Date Noted  . MDD (major depressive disorder), severe (HCC) [F32.2] 03/14/2017  . MDD (major depressive disorder), recurrent severe, without psychosis (HCC) [F33.2] 08/02/2014  . Alcohol use disorder, severe, dependence (HCC) [F10.20] 08/02/2014  . Alcohol withdrawal (HCC) [F10.239] 08/02/2014  . 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 Cocaine use disorder, moderate, dependence (HCC) [F14.20] 08/02/2014  . Substance induced mood disorder (HCC) [F19.94] 07/30/2014   Total Time spent with patient: 25 minutes  Past Psychiatric History: See H&P   Past Medical History:  Past Medical History:  Diagnosis Date  . Alcohol abuse   . Anxiety   . Depression   . Hyperlipemia   . Panic attacks   . Polysubstance abuse (HCC)    crack, oxycodone, heroin, etoh   History reviewed. No pertinent surgical history. Family History:  Family History  Problem Relation Age of Onset  . Diabetes Other   . Hypertension Other    . Cancer Other   . Heart failure Other   . Alcohol abuse Father   . Depression Brother   . Bipolar disorder Brother    Family Psychiatric  History: See H&P Social History:  Social History   Substance and Sexual Activity  Alcohol Use Yes   Comment: drinks daily- 4-5 40 oz of beer     Social History   Substance and Sexual Activity  Drug Use Yes  . Types: Benzodiazepines, IV, Cocaine   Comment: heroin    Social History   Socioeconomic History  . Marital status: Single    Spouse name: None  . Number of children: None  . Years of education: None  . Highest education level: None  Social Needs  . Financial resource strain: None  . Food insecurity - worry: None  . Food insecurity - inability: None  . Transportation needs - medical: None  . Transportation needs - non-medical: None  Occupational History  . None  Tobacco Use  . Smoking status: Current Some Day Smoker    Packs/day: 1.00    Types: Cigarettes  . Smokeless tobacco: Never Used  Substance and Sexual Activity  . Alcohol use: Yes    Comment: drinks daily- 4-5 40 oz of beer  . Drug use: Yes    Types: Benzodiazepines, IV, Cocaine    Comment: heroin  . Sexual activity: None  Other Topics Concern  . None  Social History Narrative  . None   Additional Social History:  Sleep: Good  Appetite:  Good  Current Medications: Current Facility-Administered Medications  Medication Dose Route Frequency Provider Last Rate Last Dose  . acetaminophen (TYLENOL) tablet 650 mg  650 mg Oral Q6H PRN Rankin, Shuvon B, NP      . albuterol (PROVENTIL HFA;VENTOLIN HFA) 108 (90 Base) MCG/ACT inhaler 1-2 puff  1-2 puff Inhalation Q4H PRN Nwoko, Agnes I, NP      . alum & mag hydroxide-simeth (MAALOX/MYLANTA) 200-200-20 MG/5ML suspension 30 mL  30 mL Oral Q4H PRN Rankin, Shuvon B, NP      . ARIPiprazole (ABILIFY) tablet 2 mg  2 mg Oral Daily Nwoko, Agnes I, NP   2 mg at 03/16/17 0816  .  chlordiazePOXIDE (LIBRIUM) capsule 25 mg  25 mg Oral Q6H PRN Rankin, Shuvon B, NP   25 mg at 03/15/17 0820  . chlordiazePOXIDE (LIBRIUM) capsule 25 mg  25 mg Oral TID Cobos, Rockey SituFernando A, MD   25 mg at 03/16/17 0816   Followed by  . [START ON 03/17/2017] chlordiazePOXIDE (LIBRIUM) capsule 25 mg  25 mg Oral BH-qamhs Cobos, Rockey SituFernando A, MD       Followed by  . [START ON 03/18/2017] chlordiazePOXIDE (LIBRIUM) capsule 25 mg  25 mg Oral Daily Cobos, Rockey SituFernando A, MD      . Melene Muller[START ON 03/17/2017] DULoxetine (CYMBALTA) DR capsule 60 mg  60 mg Oral Daily Accalia Rigdon, Gerlene Burdockravis B, FNP      . hydrOXYzine (ATARAX/VISTARIL) tablet 25 mg  25 mg Oral Q6H PRN Rankin, Shuvon B, NP   25 mg at 03/15/17 2207  . loperamide (IMODIUM) capsule 2-4 mg  2-4 mg Oral PRN Rankin, Shuvon B, NP      . magnesium hydroxide (MILK OF MAGNESIA) suspension 30 mL  30 mL Oral Daily PRN Rankin, Shuvon B, NP      . multivitamin with minerals tablet 1 tablet  1 tablet Oral Daily Rankin, Shuvon B, NP   1 tablet at 03/16/17 0816  . nicotine (NICODERM CQ - dosed in mg/24 hours) patch 21 mg  21 mg Transdermal Daily Armandina StammerNwoko, Agnes I, NP   21 mg at 03/16/17 0815  . ondansetron (ZOFRAN-ODT) disintegrating tablet 4 mg  4 mg Oral Q6H PRN Rankin, Shuvon B, NP      . thiamine (B-1) injection 100 mg  100 mg Intramuscular Once Rankin, Shuvon B, NP      . thiamine (VITAMIN B-1) tablet 100 mg  100 mg Oral Daily Rankin, Shuvon B, NP   100 mg at 03/16/17 0816  . traZODone (DESYREL) tablet 50 mg  50 mg Oral QHS PRN Micheal Likensainville, Christopher T, MD   50 mg at 03/15/17 2207    Lab Results: No results found for this or any previous visit (from the past 48 hour(s)).  Blood Alcohol level:  Lab Results  Component Value Date   ETH 52 (H) 03/14/2017   ETH 19 (H) 03/11/2017    Metabolic Disorder Labs: No results found for: HGBA1C, MPG No results found for: PROLACTIN No results found for: CHOL, TRIG, HDL, CHOLHDL, VLDL, LDLCALC  Physical Findings: AIMS: Facial and Oral  Movements Muscles of Facial Expression: None, normal Lips and Perioral Area: None, normal Jaw: None, normal Tongue: None, normal,Extremity Movements Upper (arms, wrists, hands, fingers): None, normal Lower (legs, knees, ankles, toes): None, normal, Trunk Movements Neck, shoulders, hips: None, normal, Overall Severity Severity of abnormal movements (highest score from questions above): None, normal Incapacitation due to abnormal movements: None, normal Patient's awareness of abnormal movements (rate  only patient's report): No Awareness, Dental Status Current problems with teeth and/or dentures?: No Does patient usually wear dentures?: No  CIWA:  CIWA-Ar Total: 3 COWS:     Musculoskeletal: Strength & Muscle Tone: within normal limits Gait & Station: normal Patient leans: N/A  Psychiatric Specialty Exam: Physical Exam  Nursing note and vitals reviewed. Constitutional: He is oriented to person, place, and time. He appears well-developed and well-nourished.  Cardiovascular: Normal rate.  Respiratory: Effort normal.  Musculoskeletal: Normal range of motion.  Neurological: He is alert and oriented to person, place, and time.  Skin: Skin is warm.    Review of Systems  Constitutional: Negative.   HENT: Negative.   Eyes: Negative.   Respiratory: Negative.   Cardiovascular: Negative.   Gastrointestinal: Negative.   Genitourinary: Negative.   Musculoskeletal: Negative.   Skin: Negative.   Neurological: Negative.   Endo/Heme/Allergies: Negative.   Psychiatric/Behavioral: Negative.     Blood pressure 111/67, pulse 88, temperature 98.2 F (36.8 C), resp. rate 18, height 6' (1.829 m), weight 122.5 kg (270 lb).Body mass index is 36.62 kg/m.  General Appearance: Casual  Eye Contact:  Good  Speech:  Clear and Coherent and Normal Rate  Volume:  Normal  Mood:  Euthymic  Affect:  Appropriate  Thought Process:  Goal Directed and Descriptions of Associations: Intact  Orientation:  Full  (Time, Place, and Person)  Thought Content:  WDL  Suicidal Thoughts:  No  Homicidal Thoughts:  No  Memory:  Immediate;   Good Recent;   Good Remote;   Good  Judgement:  Good  Insight:  Good  Psychomotor Activity:  Normal  Concentration:  Concentration: Good and Attention Span: Good  Recall:  Good  Fund of Knowledge:  Good  Language:  Good  Akathisia:  No  Handed:  Right  AIMS (if indicated):     Assets:  Communication Skills Desire for Improvement Financial Resources/Insurance Housing Physical Health Social Support Transportation  ADL's:  Intact  Cognition:  WNL  Sleep:  Number of Hours: 6.5   Problems Addressed: SUD Alcohol dependence severe MDD severe  Treatment Plan Summary: Daily contact with patient to assess and evaluate symptoms and progress in treatment, Medication management and Plan is to:  -Increase Cymbalta 60 mg PO Daily for mood stability -Continue Librium CIWA Protocol -Continue Abilify 2 mg PO Daily for mood stability -Continue Vistaril 25 mg PO Q6H PRN for anxiety -Continue Trazodone 50 mg PO QHS PRN for insomnia -Encourage group therapy participation  Maryfrances Bunnell, FNP 03/16/2017, 12:45 PM

## 2017-03-16 NOTE — Progress Notes (Signed)
Report received from Tamala SerP. Nuttall RN. Writer observed patient currently lying in bed asleep with eyes closed and respirations even and unlabored. Safety maintained witih 15 min checks.

## 2017-03-16 NOTE — [facility] Group Notes (Signed)
[facility] LCSW Group Therapy Note  Date/Time:    03/16/2017   9:15 - 10:15 AM  Type of Therapy and Topic:  Group Therapy:  Thought Distortions  Participation Level:  Active   Description of Group:  In this group, patients were introduced to cognitive distortions and discussed how these can negatively affect lives.  This included All or Nothing thinking, Labeling, Focusing on the Negative, the Shoulds, Blaming, Predicting the Future, Overgeneralization, Mind Reading, Catastrophizing, Emotional Reasoning, Personalization, and Jumping to Conclusions.  Patients then used a worksheet to describe an upsetting event in their life and the Automatic Thoughts they had about that event.  They identified the type of cognitive distortion they used and worked with the group to come up with a more realistic thought to refute their original Automatic Thought.  Therapeutic Goals: 1. Patient will be able to identify this exercise of examining their thoughts as a healthy coping mechanism. 2. Patient will identify a problem area in their life and the automatic thoughts they have about that situation. 3. Patient will verbalize the feelings and outcomes typically associated with their thoughts. 4. Patient will think about and discuss the available evidence supporting their thoughts, as well as assumptions they have to make to believe it. 5. Patient will identify evidence or facts that refute their cognitive distortion. 6. Patient will verbalize a more realistic, helpful conclusion.  Summary of Patient Progress:  The patient expressed the last time he was upset was Tuesday when he had a "run-in" with Xanax and Alcohol and the police, woke up with a bloody hand from punching his window.  He was told that he told the police to shoot him while he was under the influence, does not remember this.  He stated he "messed up" one day after getting out of rehab a month ago.  He stated he remains "messed up" on drugs/alcohol as a way of  avoiding his problems and feelings, and therefore does not have any cognitive distortions because he never gets upset because he is always using.  He was able to acknowledge that it is a cognitive distortion for him to tell himself that he has to stay stoned/inebriated because he would be unable to deal with his life problems.   Therapeutic Modalities Cognitive Behavioral Therapy Dialectical Behavioral Therapy  Ambrose MantleMareida Grossman-Orr, LCSW 03/16/2017 12:00PM

## 2017-03-16 NOTE — Progress Notes (Signed)
Patient states that he had a good day since he is "clean" (not taking drugs) and because he is happy "to be alive". His goal for tomorrow is to think about whether or not he can handle attending a two year drug program.

## 2017-03-16 NOTE — Progress Notes (Signed)
D: Patient reports he is sleeping and eating well; his energy level remains low; his concentration has improved.  He denies any thoughts of self harm.  He reports his depression as a 5; hopelessness as a 4; anxiety as an 8.  His goal today is to "get the phone numbers for the programs I'm going to go to so I can make calls on Monday."  He reports withdrawal symptoms as tremors and cravings.  He is tolerating the librium protocol well and is completing his detox soon.  He hopes to get into a long term treatment facility.  His affect is bright; he jokes at times with staff and is interacting well with his peers.  A: Continue to monitor medication management and MD orders.  Safety checks continued every 15 minutes per protocol.  Offer support and encouragement as needed.  R: Patient is receptive to staff; his behavior is appropriate.

## 2017-03-16 NOTE — Progress Notes (Signed)
D.  Pt pleasant on approach, no complaints voiced at this time.  Pt was positive for evening wrap up group, observed engaged in appropriate interaction with peers on the unit.  Pt denies SI/HI/AVH at this time.  A.  Support and encouragement offered, medication given as ordered  R.  Pt remains safe on the unit, will continue to monitor.   

## 2017-03-17 NOTE — Progress Notes (Signed)
D.  Pt pleasant on approach, denies complaints at this time.  Pt was positive for evening AA group, observed engaged in appropriate interaction with peers on the unit.  Pt denies SI/HI/AVH at this time.  A.  Support and encouragement offered, medication given as ordered.  R.  Pt remains safe on the unit, will continue to monitor.   

## 2017-03-17 NOTE — Progress Notes (Signed)
Patient ID: Jeffery JaegerMark Duda, male   DOB: 01/09/1991, 27 y.o.   MRN: [id number]  DAR: Pt. Denies SI/HI and A/V Hallucinations. He reports that he is experiencing cravings but otherwise no withdrawal symptoms. He rates his depression level 4-5/10, his hopelessness level is 3/10, and his anxiety level 9/10. Patient does not report any pain or discomfort at this time. Support and encouragement provided to the patient. Scheduled medications administered to patient per physician's orders. 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 He is seen in the milieu interacting with his peers. His affect is appropriate to circumstance and his mood is pleasant. Q15 minute checks are maintained for safety.

## 2017-03-17 NOTE — Progress Notes (Signed)
Pt attended AA group this evening.  

## 2017-03-17 NOTE — Progress Notes (Signed)
[facility]  03/17/2017 1:16 PM Jeffery Hancock  MRN:  [id number]   Subjective:  Patient reports that he is doing great today, except for craving cocain. His last use was 4 days ago. He states that he hopes to stay here until he is going to a facility or he knows he will start using again. He reports that he would go to a short term facility after here and then go to a long term facility if needed. He denies any SI/HI/AVH and contracts for safety. He denies any medication side effects after the increased dose of Cymbalta.   Objective: Patient's chart and findings reviewed and discussed with treatment team. Patient has spent most of the day in the day room interacting. He has been cooperative and pleasant. He will continue current medication regimen.  Principal Problem: Substance induced mood disorder (HCC) Diagnosis:   Patient Active Problem List   Diagnosis Date Noted  . MDD (major depressive disorder), severe (HCC) [F32.2] 03/14/2017  . MDD (major depressive disorder), recurrent severe, without psychosis (HCC) [F33.2] 08/02/2014  . Alcohol use disorder, severe, dependence (HCC) [F10.20] 08/02/2014  . Alcohol withdrawal (HCC) [F10.239] 08/02/2014  . 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 Cocaine use disorder, moderate, dependence (HCC) [F14.20] 08/02/2014  . Substance induced mood disorder (HCC) [F19.94] 07/30/2014   Total Time spent with patient: 15 minutes  Past Psychiatric History: See H&P   Past Medical History:  Past Medical History:  Diagnosis Date  . Alcohol abuse   . Anxiety   . Depression   . Hyperlipemia   . Panic attacks   . Polysubstance abuse (HCC)    crack, oxycodone, heroin, etoh   History reviewed. No pertinent surgical history. Family History:  Family History  Problem Relation Age of Onset  . Diabetes Other   . Hypertension Other   . Cancer Other   .  Heart failure Other   . Alcohol abuse Father   . Depression Brother   . Bipolar disorder Brother    Family Psychiatric  History: See H&P Social History:  Social History   Substance and Sexual Activity  Alcohol Use Yes   Comment: drinks daily- 4-5 40 oz of beer     Social History   Substance and Sexual Activity  Drug Use Yes  . Types: Benzodiazepines, IV, Cocaine   Comment: heroin    Social History   Socioeconomic History  . Marital status: Single    Spouse name: None  . Number of children: None  . Years of education: None  . Highest education level: None  Social Needs  . Financial resource strain: None  . Food insecurity - worry: None  . Food insecurity - inability: None  . Transportation needs - medical: None  . Transportation needs - non-medical: None  Occupational History  . None  Tobacco Use  . Smoking status: Current Some Day Smoker    Packs/day: 1.00    Types: Cigarettes  . Smokeless tobacco: Never Used  Substance and Sexual Activity  . Alcohol use: Yes    Comment: drinks daily- 4-5 40 oz of beer  . Drug use: Yes    Types: Benzodiazepines, IV, Cocaine    Comment: heroin  . Sexual activity: None  Other Topics Concern  . None  Social History Narrative  . None   Additional Social History:  Sleep: Good  Appetite:  Good  Current Medications: Current Facility-Administered Medications  Medication Dose Route Frequency Provider Last Rate Last Dose  . acetaminophen (TYLENOL) tablet 650 mg  650 mg Oral Q6H PRN Rankin, Shuvon B, NP      . albuterol (PROVENTIL HFA;VENTOLIN HFA) 108 (90 Base) MCG/ACT inhaler 1-2 puff  1-2 puff Inhalation Q4H PRN Nwoko, Agnes I, NP      . alum & mag hydroxide-simeth (MAALOX/MYLANTA) 200-200-20 MG/5ML suspension 30 mL  30 mL Oral Q4H PRN Rankin, Shuvon B, NP      . ARIPiprazole (ABILIFY) tablet 2 mg  2 mg Oral Daily Nwoko, Agnes I, NP   2 mg at 03/17/17 0745  . chlordiazePOXIDE (LIBRIUM)  capsule 25 mg  25 mg Oral Q6H PRN Rankin, Shuvon B, NP   25 mg at 03/16/17 2114  . chlordiazePOXIDE (LIBRIUM) capsule 25 mg  25 mg Oral BH-qamhs Cobos, Rockey Situ, MD   25 mg at 03/17/17 0745   Followed by  . [START ON 03/18/2017] chlordiazePOXIDE (LIBRIUM) capsule 25 mg  25 mg Oral Daily Cobos, Fernando A, MD      . DULoxetine (CYMBALTA) DR capsule 60 mg  60 mg Oral Daily Ichiro Chesnut, Gerlene Burdock, FNP   60 mg at 03/17/17 0745  . hydrOXYzine (ATARAX/VISTARIL) tablet 25 mg  25 mg Oral Q6H PRN Rankin, Shuvon B, NP   25 mg at 03/16/17 2114  . loperamide (IMODIUM) capsule 2-4 mg  2-4 mg Oral PRN Rankin, Shuvon B, NP      . magnesium hydroxide (MILK OF MAGNESIA) suspension 30 mL  30 mL Oral Daily PRN Rankin, Shuvon B, NP      . multivitamin with minerals tablet 1 tablet  1 tablet Oral Daily Rankin, Shuvon B, NP   1 tablet at 03/17/17 0745  . nicotine (NICODERM CQ - dosed in mg/24 hours) patch 21 mg  21 mg Transdermal Daily Armandina Stammer I, NP   21 mg at 03/17/17 0746  . ondansetron (ZOFRAN-ODT) disintegrating tablet 4 mg  4 mg Oral Q6H PRN Rankin, Shuvon B, NP      . thiamine (B-1) injection 100 mg  100 mg Intramuscular Once Rankin, Shuvon B, NP      . thiamine (VITAMIN B-1) tablet 100 mg  100 mg Oral Daily Rankin, Shuvon B, NP   100 mg at 03/17/17 0745  . traZODone (DESYREL) tablet 50 mg  50 mg Oral QHS PRN Micheal Likens, MD   50 mg at 03/16/17 2114    Lab Results: No results found for this or any previous visit (from the past 48 hour(s)).  Blood Alcohol level:  Lab Results  Component Value Date   ETH 52 (H) 03/14/2017   ETH 19 (H) 03/11/2017    Metabolic Disorder Labs: No results found for: HGBA1C, MPG No results found for: PROLACTIN No results found for: CHOL, TRIG, HDL, CHOLHDL, VLDL, LDLCALC  Physical Findings: AIMS: Facial and Oral Movements Muscles of Facial Expression: None, normal Lips and Perioral Area: None, normal Jaw: None, normal Tongue: None, normal,Extremity  Movements Upper (arms, wrists, hands, fingers): None, normal Lower (legs, knees, ankles, toes): None, normal, Trunk Movements Neck, shoulders, hips: None, normal, Overall Severity Severity of abnormal movements (highest score from questions above): None, normal Incapacitation due to abnormal movements: None, normal Patient's awareness of abnormal movements (rate only patient's report): No Awareness, Dental Status Current problems with teeth and/or dentures?: No Does patient usually wear dentures?: No  CIWA:  CIWA-Ar Total: 0 COWS:  Musculoskeletal: Strength & Muscle Tone: within normal limits Gait & Station: normal Patient leans: N/A  Psychiatric Specialty Exam: Physical Exam  Nursing note and vitals reviewed. Constitutional: He is oriented to person, place, and time. He appears well-developed and well-nourished.  Cardiovascular: Normal rate.  Respiratory: Effort normal.  Musculoskeletal: Normal range of motion.  Neurological: He is alert and oriented to person, place, and time.  Skin: Skin is warm.    Review of Systems  Constitutional: Negative.   HENT: Negative.   Eyes: Negative.   Respiratory: Negative.   Cardiovascular: Negative.   Gastrointestinal: Negative.   Genitourinary: Negative.   Musculoskeletal: Negative.   Skin: Negative.   Neurological: Negative.   Endo/Heme/Allergies: Negative.   Psychiatric/Behavioral: Negative.     Blood pressure 115/69, pulse 88, temperature 97.6 F (36.4 C), temperature source Oral, resp. rate 16, height 6' (1.829 m), weight 122.5 kg (270 lb).Body mass index is 36.62 kg/m.  General Appearance: Casual  Eye Contact:  Good  Speech:  Clear and Coherent and Normal Rate  Volume:  Normal  Mood:  Euthymic  Affect:  Appropriate  Thought Process:  Goal Directed and Descriptions of Associations: Intact  Orientation:  Full (Time, Place, and Person)  Thought Content:  WDL  Suicidal Thoughts:  No  Homicidal Thoughts:  No  Memory:   Immediate;   Good Recent;   Good Remote;   Good  Judgement:  Good  Insight:  Good  Psychomotor Activity:  Normal  Concentration:  Concentration: Good and Attention Span: Good  Recall:  Good  Fund of Knowledge:  Good  Language:  Good  Akathisia:  No  Handed:  Right  AIMS (if indicated):     Assets:  Communication Skills Desire for Improvement Financial Resources/Insurance Housing Physical Health Social Support Transportation  ADL's:  Intact  Cognition:  WNL  Sleep:  Number of Hours: 6.25   Problems Addressed: SUD Alcohol dependence severe MDD severe  Treatment Plan Summary: Daily contact with patient to assess and evaluate symptoms and progress in treatment, Medication management and Plan is to:  -Continue Cymbalta 60 mg PO Daily for mood stability -Continue Librium CIWA Protocol -Continue Abilify 2 mg PO Daily for mood stability -Continue Vistaril 25 mg PO Q6H PRN for anxiety -Continue Trazodone 50 mg PO QHS PRN for insomnia -Encourage group therapy participation  Maryfrances Bunnell, FNP 03/17/2017, 1:16 PM

## 2017-03-17 NOTE — [facility] Group Notes (Signed)
[facility]  Date/Time:  03/17/2017 10:00-11:00AM  Type of Therapy and Topic:  Group Therapy:  Healthy and Unhealthy Supports  Participation Level:  Active   Description of Group:  Patients in this group were introduced to the idea of adding a variety of healthy supports to address the various needs in their lives. The picture on the front of Sunday's workbook was used to demonstrate why more supports are needed in every patient's life.  Patients identified and described healthy supports versus unhealthy supports in general, then gave examples of each in their own lives.   They discussed what additional healthy supports could be helpful in their recovery and wellness after discharge in order to prevent future hospitalizations.   An emphasis was placed on using counselor, doctor, therapy groups, 12-step groups, and problem-specific support groups to expand supports.  They also worked as a group on developing a specific plan for several patients to deal with unhealthy supports through boundary-setting, psychoeducation with loved ones, and even termination of relationships.   Therapeutic Goals:   1)  discuss importance of adding supports to stay well once out of the hospital  2)  compare healthy versus unhealthy supports and identify some examples of each  3)  generate ideas and descriptions of healthy supports that can be added  4)  offer mutual support about how to address unhealthy supports  5)  encourage active participation in and adherence to discharge plan    Summary of Patient Progress:  The patient shared that he is having to work hard to convince himself to go [place] despite knowing that anything less than that is going to be very likely to end in relapse.  He and the group identified benefits about how that move would help him to achieve his long-term goal of "getting and staying clean."     Therapeutic Modalities:   Motivational  Interviewing Brief Solution-Focused Therapy  Ambrose MantleMareida Grossman-Orr, LCSW

## 2017-03-18 DIAGNOSIS — F1310 Sedative, hypnotic or anxiolytic abuse, uncomplicated: Secondary | ICD-10-CM

## 2017-03-18 DIAGNOSIS — F1410 Cocaine abuse, uncomplicated: Secondary | ICD-10-CM

## 2017-03-18 DIAGNOSIS — F329 Major depressive disorder, single episode, unspecified: Secondary | ICD-10-CM

## 2017-03-18 MED ORDER — HYDROXYZINE HCL 25 MG PO TABS
25.0000 mg | ORAL_TABLET | Freq: Four times a day (QID) | ORAL | Status: DC | PRN
  Administered 2017-03-18 (×2): 25 mg via ORAL
  Filled 2017-03-18: qty 1
  Filled 2017-03-18 (×2): qty 10
  Filled 2017-03-18: qty 1

## 2017-03-18 MED ORDER — TRAZODONE HCL 100 MG PO TABS
100.0000 mg | ORAL_TABLET | Freq: Every day | ORAL | Status: DC
  Administered 2017-03-18: 100 mg via ORAL
  Filled 2017-03-18: qty 1
  Filled 2017-03-18: qty 7
  Filled 2017-03-18: qty 1
  Filled 2017-03-18: qty 7
  Filled 2017-03-18: qty 1

## 2017-03-18 NOTE — Progress Notes (Signed)
[facility] MD Progress Note  03/18/2017 2:21 PM Jeffery Hancock  MRN:  [id number]   Subjective: [name] reports, "I'm doing pretty good today. My depression is responding to the Cymbalta. Cymbalta has worked well for me in the past, it is just, I will start drinking heavily & I will not take it. The only thing is that I have problem falling asleep at night. I talked to [facility] today to know about their program. I have a scheduled phone interview on Wednesday for placement after discharge. I kind of like the program".    Objective: Jeffery Hancock is a 27 y/o M with history of MDD, alcohol use disorder, and cocaine use disorder who was admitted voluntarily with worsening  Today, 03-18-16, Jeffery Hancock is seen, chart reviewed. The chart  findings discussed with the treatment team. Patient is alert, oriented x 4. He presents with a bright affect. He says he is doing really well. He is taking & tolerating his treatment regimen. Denies any adverse effects. He is attending & participating in the group sessions. No behavioral issues presented. He says he is in the phase of looking into further substance treatment. He says he is interested in going to [place]. Says has a scheduled phone interview with [facility] on Wednesday. He denies any SIHI, AVH, delusional thoughts or paranoia. He has been cooperative and pleasant on the unit. He will continue current medication regimen.  Principal Problem: Substance induced mood disorder (HCC) Diagnosis:   Patient Active Problem List   Diagnosis Date Noted  . Alcohol use disorder, severe, dependence (HCC) [F10.20] 08/02/2014    Priority: High  . Substance induced mood disorder (HCC) [F19.94] 07/30/2014    Priority: High  . Cocaine use disorder, moderate, dependence (HCC) [F14.20] 08/02/2014    Priority: Medium  . MDD (major depressive disorder), severe (HCC) [F32.2] 03/14/2017  . MDD (major depressive disorder), recurrent severe, without psychosis (HCC) [F33.2] 08/02/2014  . Alcohol  withdrawal (HCC) [F10.239] 08/02/2014  . 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 Alcohol abuse   . Anxiety   . Depression   . Hyperlipemia   . Panic attacks   . Polysubstance abuse (HCC)    crack, oxycodone, heroin, etoh   History reviewed. No pertinent surgical history.  Family History:  Family History  Problem Relation Age of Onset  . Diabetes Other   . Hypertension Other   . Cancer Other   . Heart failure Other   . Alcohol abuse Father   . Depression Brother   . Bipolar disorder Brother    Family Psychiatric  History: See H&P  Social History:  Social History   Substance and Sexual Activity  Alcohol Use Yes   Comment: drinks daily- 4-5 40 oz of beer     Social History   Substance and Sexual Activity  Drug Use Yes  . Types: Benzodiazepines, IV, Cocaine   Comment: heroin    Social History   Socioeconomic History  . Marital status: Single    Spouse name: None  . Number of children: None  . Years of education: None  . Highest education level: None  Social Needs  . Financial resource strain: None  . Food insecurity - worry: None  . Food insecurity - inability: None  . Transportation needs - medical: None  .  Transportation needs - non-medical: None  Occupational History  . None  Tobacco Use  . Smoking status: Current Some Day Smoker    Packs/day: 1.00    Types: Cigarettes  . Smokeless tobacco: Never Used  Substance and Sexual Activity  . Alcohol use: Yes    Comment: drinks daily- 4-5 40 oz of beer  . Drug use: Yes    Types: Benzodiazepines, IV, Cocaine    Comment: heroin  . Sexual activity: None  Other Topics Concern  . None  Social History Narrative  . None   Additional Social History:   Sleep:  Good  Appetite:  Good  Current Medications: Current Facility-Administered Medications  Medication Dose Route Frequency Provider Last Rate Last Dose  . acetaminophen (TYLENOL) tablet 650 mg  650 mg Oral Q6H PRN Rankin, Shuvon B, NP      . albuterol (PROVENTIL HFA;VENTOLIN HFA) 108 (90 Base) MCG/ACT inhaler 1-2 puff  1-2 puff Inhalation Q4H PRN Felma Pfefferle I, NP      . alum & mag hydroxide-simeth (MAALOX/MYLANTA) 200-200-20 MG/5ML suspension 30 mL  30 mL Oral Q4H PRN Rankin, Shuvon B, NP      . ARIPiprazole (ABILIFY) tablet 2 mg  2 mg Oral Daily Shaquanta Harkless I, NP   2 mg at 03/18/17 0831  . DULoxetine (CYMBALTA) DR capsule 60 mg  60 mg Oral Daily Money, Gerlene Burdock, FNP   60 mg at 03/18/17 0831  . hydrOXYzine (ATARAX/VISTARIL) tablet 25 mg  25 mg Oral Q6H PRN Jeffery Stammer I, NP   25 mg at 03/18/17 1312  . magnesium hydroxide (MILK OF MAGNESIA) suspension 30 mL  30 mL Oral Daily PRN Rankin, Shuvon B, NP      . multivitamin with minerals tablet 1 tablet  1 tablet Oral Daily Rankin, Shuvon B, NP   1 tablet at 03/18/17 0831  . nicotine (NICODERM CQ - dosed in mg/24 hours) patch 21 mg  21 mg Transdermal Daily Jeffery Stammer I, NP   21 mg at 03/18/17 0833  . thiamine (B-1) injection 100 mg  100 mg Intramuscular Once Rankin, Shuvon B, NP      . thiamine (VITAMIN B-1) tablet 100 mg  100 mg Oral Daily Rankin, Shuvon B, NP   100 mg at 03/18/17 0831  . traZODone (DESYREL) tablet 50 mg  50 mg Oral QHS PRN Micheal Likens, MD   50 mg at 03/17/17 2131   Lab Results: No results found for this or any previous visit (from the past 48 hour(s)).  Blood Alcohol level:  Lab Results  Component Value Date   ETH 52 (H) 03/14/2017   ETH 19 (H) 03/11/2017   Metabolic Disorder Labs: No results found for: HGBA1C, MPG No results found for: PROLACTIN No results found for: CHOL, TRIG, HDL, CHOLHDL, VLDL, LDLCALC  Physical Findings: AIMS: Facial and Oral Movements Muscles of Facial Expression: None,  normal Lips and Perioral Area: None, normal Jaw: None, normal Tongue: None, normal,Extremity Movements Upper (arms, wrists, hands, fingers): None, normal Lower (legs, knees, ankles, toes): None, normal, Trunk Movements Neck, shoulders, hips: None, normal, Overall Severity Severity of abnormal movements (highest score from questions above): None, normal Incapacitation due to abnormal movements: None, normal Patient's awareness of abnormal movements (rate only patient's report): No Awareness, Dental Status Current problems with teeth and/or dentures?: No Does patient usually wear dentures?: No  CIWA:  CIWA-Ar Total: 0 COWS:     Musculoskeletal: Strength & Muscle Tone: within normal limits Gait & Station: normal  Patient leans: N/A  Psychiatric Specialty Exam: Physical Exam  Nursing note and vitals reviewed. Constitutional: He is oriented to person, place, and time. He appears well-developed and well-nourished.  Cardiovascular: Normal rate.  Respiratory: Effort normal.  Musculoskeletal: Normal range of motion.  Neurological: He is alert and oriented to person, place, and time.  Skin: Skin is warm.    Review of Systems  Constitutional: Negative.   HENT: Negative.   Eyes: Negative.   Respiratory: Negative.   Cardiovascular: Negative.   Gastrointestinal: Negative.   Genitourinary: Negative.   Musculoskeletal: Negative.   Skin: Negative.   Neurological: Negative.   Endo/Heme/Allergies: Negative.   Psychiatric/Behavioral: Negative.     Blood pressure 99/70, pulse 94, temperature 97.6 F (36.4 C), temperature source Oral, resp. rate 16, height 6' (1.829 m), weight 122.5 kg (270 lb).Body mass index is 36.62 kg/m.  General Appearance: Casual  Eye Contact:  Good  Speech:  Clear and Coherent and Normal Rate  Volume:  Normal  Mood:  Euthymic  Affect:  Appropriate  Thought Process:  Goal Directed and Descriptions of Associations: Intact  Orientation:  Full (Time, Place, and  Person)  Thought Content:  WDL  Suicidal Thoughts:  No  Homicidal Thoughts:  No  Memory:  Immediate;   Good Recent;   Good Remote;   Good  Judgement:  Good  Insight:  Good  Psychomotor Activity:  Normal  Concentration:  Concentration: Good and Attention Span: Good  Recall:  Good  Fund of Knowledge:  Good  Language:  Good  Akathisia:  Negative  Handed:  Right  AIMS (if indicated):     Assets:  Communication Skills Desire for Improvement Social Support  ADL's:  Intact  Cognition:  WNL  Sleep:  Number of Hours: 6.75   Problems Addressed: SUD Alcohol dependence severe MDD severe  Treatment Plan Summary: Daily contact with patient to assess and evaluate symptoms and progress in treatment : Patient is currently approaching his basline level of functions. There are no evidence of mania or psychosis. We are finalizing his after care.  - Continue inpatient hospitalization.  Will continue today 03/18/2017 plan as below except where it is noted.  Depression.   - Continue Cymbalta 60 mg PO Daily.    - Discontinue Librium CIWA Protocol, completed.  Mood control.   - Continue Abilify 2 mg PO Daily.  Anxiety.   - Continue Vistaril 25 mg PO Q6H PRN.  Insomnia.   - Increased Trazodone from 50 mg to 100 mg PO QHS.    - Encourage group therapy participation.   - SW to continue to work on the discharge disposition.  Jeffery StammerAgnes Annye Forrey, NP, PMHNP-Bc. 03/18/2017, 2:21 PM Patient ID: Jeffery Hancock, male   DOB: 05/21/1990, [age] y.o.   MRN: [id number]

## 2017-03-18 NOTE — Progress Notes (Signed)
Adult Psychoeducational Group Note  Date:  03/18/2017 Time:  11:58 PM  Group Topic/Focus:  Wrap-Up Group:   The focus of this group is to help patients review their daily goal of treatment and discuss progress on daily workbooks.  Participation Level:  Active  Participation Quality:  Appropriate  Affect:  Appropriate  Cognitive:  Appropriate  Insight: Appropriate  Engagement in Group:  Engaged  Modes of Intervention:  Discussion  Additional Comments: Pt stated his goal for today was to talk with his social worker about his after care placement. Pt stated he accomplished his goal and has a phone interview with his next potential placement Wednesday. Pt rated his over all day an 8 out of 10. Pt stated he attend all groups held today.  Jeffery FurnaceChristopher  Jeffery Hancock 03/18/2017, 11:58 PM

## 2017-03-18 NOTE — Progress Notes (Signed)
Patient ID: Jeffery Hancock, male   DOB: 10/01/1990, 27 y.o.   MRN: [id number]  DAR: Pt. Denies SI/HI and A/V Hallucinations. He reports that his sleep last night was fair, his appetite is good, his energy level is normal, and his concentration is good. He rates his depression 5/10, his hopelessness is 4/10, and his anxiety is 8/10. He received a PRN Vistaril this afternoon for anxiety. Patient does not report any pain or discomfort at this time. Support and encouragement provided to the patient. Scheduled medications administered to patient per physician's orders. Patient is seen in the milieu and interacting with his peers throughout most of the day. He is minimal with Clinical research associatewriter but pleasant. His affect is blunted initially but brightens on interaction. Q15 minute checks are maintained for safety.

## 2017-03-18 NOTE — Progress Notes (Signed)
D   Pt is pleasant on approach and cooperative   He attends groups and interacts appropriately with staff and peers   Pt is wanting further treatment and rehab but has not been able to locate a place yet    Pt denies SI/HI presently   Pt reports feeling good about his time here and hopes to maintain sobriety  A    Verbal support given   Medications administered and effectiveness monitored    Q 15 min checks    Encouraged lifestyle changes needed to maintain sobriety R   Pt is safe and verbalized understanding

## 2017-03-18 NOTE — Progress Notes (Signed)
Pt attended spiritual care group on grief and loss facilitated by chaplain Burnis KingfisherMatthew Samnang Shugars   Group opened with brief discussion and psycho-social ed around grief and loss in relationships and in relation to self - identifying life patterns, circumstances, changes that cause losses. Established group norm of speaking from own life experience. Group goal of establishing open and affirming space for members to share loss and experience with grief, normalize grief experience and provide psycho social education and grief support.   Jeffery Hancock was present throughout group.  Engaged in group discussion.  Described motivation for entering recovery - stating he is not afraid of death, but does not wish to continue living his life in the way he has been.  Jeffery Hancock related that his brother had died from heroin overdose as well as a close cousin.  Described he moves through losses by trying to be pragmatic --  looking forward and not dwelling on sadness and stated that others can find him "cold hearted."

## 2017-03-18 NOTE — Progress Notes (Signed)
Recreation Therapy Notes  Date: 03/18/17 Time: 0930 Location: [address] Dayroom  Group Topic: Stress Management  Goal Area(s) Addresses:  Patient will verbalize importance of using healthy stress management.  Patient will identify positive emotions associated with healthy stress management.   Behavioral Response: Engaged  Intervention: Stress Management  Activity :  [employer] [facility].  LRT introduced the stress management technique of guided imagery.  LRT read a script to allow patients to visualize being in a protected wildlife sanctuary.  Patients were to follow along as script was read to engage in activity.  Education:  Stress Management, Discharge Planning.   Education Outcome: Acknowledges edcuation/In group clarification offered/Needs additional education  Clinical Observations/Feedback: Pt attended group.     Caroll RancherMarjette Chandelle Harkey, LRT/CTRS     Caroll RancherLindsay, Hadasa Gasner A 03/18/2017 12:08 PM

## 2017-03-18 NOTE — [facility] Group Notes (Signed)
LCSW Group Therapy Note   03/18/2017 1:15pm   Type of Therapy and Topic:  Group Therapy:  Overcoming Obstacles   Participation Level:  Active   Description of Group:    In this group patients will be encouraged to explore what they see as obstacles to their own wellness and recovery. They will be guided to discuss their thoughts, feelings, and behaviors related to these obstacles. The group will process together ways to cope with barriers, with attention given to specific choices patients can make. Each patient will be challenged to identify changes they are motivated to make in order to overcome their obstacles. This group will be process-oriented, with patients participating in exploration of their own experiences as well as giving and receiving support and challenge from other group members.   Therapeutic Goals: 1. Patient will identify personal and current obstacles as they relate to admission. 2. Patient will identify barriers that currently interfere with their wellness or overcoming obstacles.  3. Patient will identify feelings, thought process and behaviors related to these barriers. 4. Patient will identify two changes they are willing to make to overcome these obstacles:      Summary of Patient Progress   Jeffery Hancock states that he doing great and plans to return home with his parents at discharge. "I don't really know of any obstacles other than trying to get into [facility]." Jeffery Hancock shared that his previous attempts at sobriety "have all failed, so I should probably try to get in somewhere." He continues to show progress in the group setting with improving insight.    Therapeutic Modalities:   Cognitive Behavioral Therapy Solution Focused Therapy Motivational Interviewing Relapse Prevention Therapy  Ledell PeoplesHeather N Smart, LCSW 03/18/2017 1:04 PM

## 2017-03-19 MED ORDER — TRAZODONE HCL 100 MG PO TABS: 100.0000 mg | ORAL_TABLET | Freq: Every day | ORAL | 0 refills | Status: DC

## 2017-03-19 MED ORDER — ARIPIPRAZOLE 2 MG PO TABS: 2.0000 mg | ORAL_TABLET | Freq: Every day | ORAL | 0 refills | Status: DC

## 2017-03-19 MED ORDER — HYDROXYZINE HCL 25 MG PO TABS: 25.0000 mg | ORAL_TABLET | Freq: Four times a day (QID) | ORAL | 0 refills | Status: AC | PRN

## 2017-03-19 MED ORDER — ALBUTEROL SULFATE HFA 108 (90 BASE) MCG/ACT IN AERS: 1.0000 | INHALATION_SPRAY | Freq: Four times a day (QID) | RESPIRATORY_TRACT | Status: AC | PRN

## 2017-03-19 MED ORDER — NICOTINE 21 MG/24HR TD PT24: 21.0000 mg | MEDICATED_PATCH | Freq: Every day | TRANSDERMAL | 0 refills | Status: AC

## 2017-03-19 MED ORDER — DULOXETINE HCL 60 MG PO CPEP: 60.0000 mg | ORAL_CAPSULE | Freq: Every day | ORAL | 0 refills | Status: DC

## 2017-03-19 NOTE — Discharge Summary (Signed)
Physician Discharge Summary Note  Patient:  Jeffery Hancock is an 27 y.o., male  MRN:  [id number]  DOB:  [birth year]  Patient phone:  [phone] (home)   Patient address:   [address] [place] [address],   Total Time spent with patient: Greater than 30 minutes  Date of Admission:  03/14/2017  Date of Discharge: 03/19/17  Reason for Admission: Threat of self-harm & suicide by cop.  Principal Problem: Substance induced mood disorder [facility]) Discharge Diagnoses: Patient Active Problem List   Diagnosis Date Noted  . Alcohol use disorder, severe, dependence (HCC) [F10.20] 08/02/2014    Priority: High  . Substance induced mood disorder (HCC) [F19.94] 07/30/2014    Priority: High  . Cocaine use disorder, moderate, dependence (HCC) [F14.20] 08/02/2014    Priority: Medium  . MDD (major depressive disorder), severe (HCC) [F32.2] 03/14/2017  . MDD (major depressive disorder), recurrent severe, without psychosis (HCC) [F33.2] 08/02/2014  . Alcohol withdrawal (HCC) [F10.239] 08/02/2014  . 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 He appears well-developed.  HENT:  Head: Normocephalic.  Eyes: Pupils are equal, round, and reactive to light.  Neck: Normal range of motion.  Cardiovascular: Normal rate.  Respiratory: Effort normal.  GI: Soft.  Genitourinary:  Genitourinary Comments: Deferred  Musculoskeletal: Normal range of motion.  Neurological: He is alert and oriented to person, place, and time.  Skin: Skin is warm.  Psychiatric: His speech is normal and behavior is normal. Judgment and thought content normal. His mood appears not anxious. His affect is not angry, not blunt,  not labile and not inappropriate. Cognition and memory are normal. He does not exhibit a depressed mood.    Review of Systems  Constitutional: Negative.   HENT: Negative.   Eyes: Negative.   Respiratory: Negative.   Cardiovascular: Negative.   Gastrointestinal: Negative.   Genitourinary: Negative.   Musculoskeletal: Negative.   Skin: Negative.   Neurological: Negative.   Endo/Heme/Allergies: Negative.   Psychiatric/Behavioral: Positive for depression (Stable) and substance abuse (Alcohol & cocaine use disorder). Negative for hallucinations, memory loss and suicidal ideas. The patient has insomnia (Stable). The patient is not nervous/anxious.     Blood pressure 132/78, pulse 85, temperature 97.7 F (36.5 C), temperature source Oral, resp. rate 18, height 6' (1.829 m), weight 122.5 kg (270 lb).Body mass index is 36.62 kg/m.  See Md's SRA  Have you used any form of tobacco in the last 30 days? (Cigarettes, Smokeless Tobacco, Cigars, and/or Pipes): Yes   Has this patient used any form of tobacco in the last 30 days? (Cigarettes, Smokeless Tobacco, Cigars, and/or Pipes): Yes, we reviewed benefits of smoking cessation, and patient wants nicotine patches prescribed at discharge.   Past Medical History:  Past Medical History:  Diagnosis Date  . Alcohol abuse   . Anxiety   . Depression   . Hyperlipemia   . Panic attacks   . Polysubstance abuse (HCC)    crack, oxycodone, heroin, etoh   History reviewed. No pertinent surgical history.  Family History:  Family History  Problem Relation Age of Onset  . Diabetes Other   . Hypertension Other   . Cancer Other   . Heart failure Other   . Alcohol abuse Father   . Depression Brother   .  Bipolar disorder Brother    Social History:  Social History   Substance and Sexual Activity  Alcohol Use Yes   Comment: drinks daily- 4-5 40 oz of beer     Social History   Substance and Sexual Activity  Drug Use Yes  . Types: Benzodiazepines,  IV, Cocaine   Comment: heroin    Social History   Socioeconomic History  . Marital status: Single    Spouse name: None  . Number of children: None  . Years of education: None  . Highest education level: None  Social Needs  . Financial resource strain: None  . Food insecurity - worry: None  . Food insecurity - inability: None  . Transportation needs - medical: None  . Transportation needs - non-medical: None  Occupational History  . None  Tobacco Use  . Smoking status: Current Some Day Smoker    Packs/day: 1.00    Types: Cigarettes  . Smokeless tobacco: Never Used  Substance and Sexual Activity  . Alcohol use: Yes    Comment: drinks daily- 4-5 40 oz of beer  . Drug use: Yes    Types: Benzodiazepines, IV, Cocaine    Comment: heroin  . Sexual activity: None  Other Topics Concern  . None  Social History Narrative  . None   Risk to Self: Is patient at risk for suicide?: Yes  Level of Care:  OP  Hospital Course: This is an admission assessment for this 27 year old Caucasian male with hx of alcoholism & Cocaine use disorder. His reports indicated that he has dabbled in Benzodiazepine & opoid abuse (Xanax & Suboxen). He is [facility] to the [facility] from the [facility] with complaints of self-harm verbiage while intoxicated. Chart review indicated that patient had wanted the cops to shoot him. This happened after an argument with his roommate. He has apparently relapsed after getting out of substance abuse program just of recent.  This is one of Jeffery Hancock's several discharge summaries from this hospital. He is known on this unit from previous hospitalizations, all related to substance use disorder. Jeffery Hancock was re-admitted to the hospital with his UDS reports showing positive Benzodiazepine, cocaine & a BAL of 52 per toxicology test results. He was in the hospital for suicidal threats & suicide by cop. On his previous admissions, Jeffery Hancock's lab reports had shown elevated liver enzymes (AST,  ALT), possibily from chronic alcoholism. This time around on this admission, his liver enzymes has improved to normal. This is because Jeffery Hancock has been at the [facility] [place] in [place] receiving substance abuse treatments. He recently relapsed soon after discharge from the program.   On his admission assessment, Jeffery Hancock stressed the need to get back on his mental health medications. He stated that he had in the past done well on Cymbalta. He was also enrolled in the group counseling sessions, AA/NA meetings being offered and held on this unit. He participated and learned coping skills. Jeffery Hancock presented with no other significant medical issues that required treatments. He tolerated his treatment regimen without any adverse effects reported.  During the course of this hospitalization,  Jeffery Hancock  was medicated & discharged on;  Abilify 2 mg for mood control/adjunct to antidepressant, Duloxetine 60 mg for depression & Hydroxyzine 25 mg prn for anxiety, Nicotine patch 21 mg for smoking cessation & Trazodone 100 mg for insomnia. Jeffery Hancock's symptoms responded well to his treatment. This is evidenced by his reports of improved mood, absence substance withdrawal symptoms & suicidal ideations. He  will continue further substance abuse treatment as noted below. He is provided with all the necessary information needed to make this appointment without problems.  Upon discharge, Jeffery Hancock adamantly denies any suicidal, homicidal ideations, auditory, visual hallucinations, delusional thoughts, paranoia and or withdrawal symptoms. He left [facility] with all personal belongings in no apparent distress. He received a 7 days worth supply samples of his [facility] discharge medications provided by [facility] pharmacy. Transportation per Mother.  Consults:  psychiatry  Discharge Vitals:   Blood pressure 132/78, pulse 85, temperature 97.7 F (36.5 C), temperature source Oral, resp. rate 18, height 6' (1.829 m), weight 122.5 kg (270 lb). Body mass index is  36.62 kg/m.  Lab Results:   No results found for this or any previous visit (from the past 72 hour(s)).  Physical Findings: AIMS: Facial and Oral Movements Muscles of Facial Expression: None, normal Lips and Perioral Area: None, normal Jaw: None, normal Tongue: None, normal,Extremity Movements Upper (arms, wrists, hands, fingers): None, normal Lower (legs, knees, ankles, toes): None, normal, Trunk Movements Neck, shoulders, hips: None, normal, Overall Severity Severity of abnormal movements (highest score from questions above): None, normal Incapacitation due to abnormal movements: None, normal Patient's awareness of abnormal movements (rate only patient's report): No Awareness, Dental Status Current problems with teeth and/or dentures?: No Does patient usually wear dentures?: No  CIWA:  CIWA-Ar Total: 0 COWS:  COWS Total Score: 0  See Psychiatric Specialty Exam and Suicide Risk Assessment completed by Attending Physician prior to discharge.  Discharge destination:  Home  Is patient on multiple antipsychotic therapies at discharge:  No   Has Patient had three or more failed trials of antipsychotic monotherapy by history:  No  Recommended Plan for Multiple Antipsychotic Therapies: NA  Allergies as of 03/19/2017   No Known Allergies     Medication List    TAKE these medications     Indication  albuterol 108 (90 Base) MCG/ACT inhaler Commonly known as:  PROVENTIL HFA;VENTOLIN HFA Inhale 1-2 puffs into the lungs every 6 (six) hours as needed for wheezing or shortness of breath.  Indication:  Asthma   ARIPiprazole 2 MG tablet Commonly known as:  ABILIFY Take 1 tablet (2 mg total) by mouth daily. For mood control/adjunct to antidepressant Start taking on:  03/20/2017  Indication:  Major Depressive Disorder, Adjunct to Cymbalta   DULoxetine 60 MG capsule Commonly known as:  CYMBALTA Take 1 capsule (60 mg total) by mouth daily. For depression Start taking on:  03/20/2017   Indication:  Major Depressive Disorder   hydrOXYzine 25 MG tablet Commonly known as:  ATARAX/VISTARIL Take 1 tablet (25 mg total) by mouth every 6 (six) hours as needed for anxiety.  Indication:  Feeling Anxious   nicotine 21 mg/24hr patch Commonly known as:  NICODERM CQ - dosed in mg/24 hours Place 1 patch (21 mg total) onto the skin daily. (May purchase from over the counter): For smoking cessation Start taking on:  03/20/2017  Indication:  Nicotine Addiction   traZODone 100 MG tablet Commonly known as:  DESYREL Take 1 tablet (100 mg total) by mouth at bedtime. For sleep  Indication:  Trouble Sleeping      Follow-up Information    Abusers, [facility] Follow up.   Why:  Phone interview on 1/16 to determine eligiblility for assessment and to possibly schedule admission.  Contact information: [address] [place] [address] [phone]        Services, [facility] Follow up on 03/21/2017.  Why:  Please walk in at 9:00AM on Thursday, 1/17 for hospital follow-up appt/assessment. Please bring: photo ID, social security card, proof of household income, and hospital discharge paperwork. Thank you.  Contact information: [address] [place] [address] [phone]          Follow-up recommendations: Activity:  As tolerated Diet: As recommended by your primary care doctor. Keep all scheduled follow-up appointments as recommended.  Comments: Patient is instructed prior to discharge to: Take all medications as prescribed by his/her mental healthcare provider. Report any adverse effects and or reactions from the medicines to his/her outpatient provider promptly. Patient has been instructed & cautioned: To not engage in alcohol and or illegal drug use while on prescription medicines. In the event of worsening symptoms, patient is instructed to call the crisis hotline, 911 and or go to the nearest ED for appropriate evaluation and treatment of  symptoms. To follow-up with his/her primary care provider for your other medical issues, concerns and or health care needs.   Signed: Armandina StammerAgnes Nwoko, PMHNP, FNP-BC 03/19/2017, 1:52 PM    Patient seen, Suicide Assessment Completed.  Disposition Plan Reviewed   Carloyn JaegerMark Hancock is a 27 y/o M with history of MDD, alcohol use disorder, and cocaine use disorder who was admitted voluntarily with worsening depression, SI with plan to jump in front of a train, and worsening use of cocaine and alcohol. [facility]; he was started on CIWA protocol with librium. He was started on combination of duloxetine and abilify. His symptoms gradually improved and he was able to engage with SW team about treatment plans for after discharge. He indicated that he would like to pursue long-term treatment and he has arranged an interview with [facility] for tomorrow.  Today upon evaluation, pt reports he is doing well overall. He reports his mood is "good" in regards to his plan to have his phone interview tomorrow. He denies SI/HI/AH/VH. He is sleeping well and his appetite is good. He is future oriented about his outpatient follow up, and he plans to stay at his mother's home to avoid triggers for using prior to getting into substance treatment. He is tolerating his current medication regimen without difficulty or side effects, and he plans to continue his current regimen without changes. He was able to engage in safety planning including [facility] or contact emergency services if he feels unable to maintain his own safety or the safety of others. Pt had no further questions, comments, or concerns.   Plan Of Care/Follow-up recommendations:   - Discharge to outpatient level of care  -MDD - Continue Cymbalta 60 mg PO Daily.  Mood control. - Continue Abilify 2 mg PO Daily.  Anxiety. - Continue Vistaril 25 mg PO Q6H PRN.  Insomnia. -  ContinueTrazodone 100 mgPO QHS.  Activity:  as tolerated Diet:  normal Tests:  NA Other:  see above for DC plan  Micheal Likenshristopher T Bert Givans, MD

## 2017-03-19 NOTE — Progress Notes (Signed)
  [facility] Adult Case Management Discharge Plan :  Will you be returning to the same living situation after discharge:  Yes,  home At discharge, do you have transportation home?: Yes,  parent Do you have the ability to pay for your medications: Yes,  mental health  Release of information consent forms completed and submitted to medical records by CSW.  Patient to Follow up at: Follow-up Information    Abusers, [facility] Follow up.   Why:  Phone interview on 1/16 to determine eligiblility for assessment and to possibly schedule admission.  Contact information: [address] [place] [address] [phone]        Services, [facility] Follow up on 03/21/2017.   Why:  Please walk in at 9:00AM on Thursday, 1/17 for hospital follow-up appt/assessment. Please bring: photo ID, social security card, proof of household income, and hospital discharge paperwork. Thank you.  Contact information: [address] [place] [address] [phone]           Next level of care provider has access to [facility] Link:no  Safety Planning and Suicide Prevention discussed: Yes,  SPE completed with pt and his mother. SPI pamphlet and Mobile Crisis information provided to pt.   Have you used any form of tobacco in the last 30 days? (Cigarettes, Smokeless Tobacco, Cigars, and/or Pipes): Yes  Has patient been referred to the Quitline?: Patient refused referral  Patient has been referred for addiction treatment: Yes  Pulte HomesHeather N Smart, LCSW 03/19/2017, 9:35 AM

## 2017-03-19 NOTE — [facility] Suicide Risk Assessment (Signed)
[facility] Discharge Suicide Risk Assessment   Principal Problem: Substance induced mood disorder [facility]) Discharge Diagnoses:  Patient Active Problem List   Diagnosis Date Noted  . MDD (major depressive disorder), severe (HCC) [F32.2] 03/14/2017  . MDD (major depressive disorder), recurrent severe, without psychosis (HCC) [F33.2] 08/02/2014  . Alcohol use disorder, severe, dependence (HCC) [F10.20] 08/02/2014  . Alcohol withdrawal (HCC) [F10.239] 08/02/2014  . 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 Cocaine use disorder, moderate, dependence (HCC) [F14.20] 08/02/2014  . Substance induced mood disorder (HCC) [F19.94] 07/30/2014    Total Time spent with patient: 30 minutes  Musculoskeletal: Strength & Muscle Tone: within normal limits Gait & Station: normal Patient leans: N/A  Psychiatric Specialty Exam: Review of Systems  Constitutional: Negative for chills and fever.  Respiratory: Negative for cough.   Cardiovascular: Negative for chest pain.  Gastrointestinal: Negative for heartburn and nausea.  Psychiatric/Behavioral: Negative for depression, hallucinations and suicidal ideas. The patient is not nervous/anxious.     Blood pressure 132/78, pulse 85, temperature 97.7 F (36.5 C), temperature source Oral, resp. rate 18, height 6' (1.829 m), weight 122.5 kg (270 lb).Body mass index is 36.62 kg/m.  General Appearance: Casual and Fairly Groomed  Patent attorney::  Good  Speech:  Clear and Coherent and Normal Rate  Volume:  Normal  Mood:  Euthymic  Affect:  Appropriate  Thought Process:  Coherent and Goal Directed  Orientation:  Full (Time, Place, and Person)  Thought Content:  Logical  Suicidal Thoughts:  No  Homicidal Thoughts:  No  Memory:  Immediate;   Fair Recent;   Fair Remote;   Fair  Judgement:  Fair  Insight:  Fair  Psychomotor Activity:  Normal  Concentration:  Fair   Recall:  [employer] of Knowledge:Fair  Language: Fair  Akathisia:  No  Handed:    AIMS (if indicated):     Assets:  Manufacturing systems engineer Physical Health Resilience Social Support  Sleep:  Number of Hours: 6.75  Cognition: WNL  ADL's:  Intact   Mental Status Per Nursing Assessment::   On Admission:  Suicide plan, Self-harm thoughts  Demographic Factors:  Male, Caucasian and Low socioeconomic status  Loss Factors: Financial problems/change in socioeconomic status  Historical Factors: Impulsivity  Risk Reduction Factors:   Living with another person, especially a relative, Positive social support, Positive therapeutic relationship and Positive coping skills or problem solving skills  Continued Clinical Symptoms:  Depression:   Severe Alcohol/Substance Abuse/Dependencies  Cognitive Features That Contribute To Risk:  None    Suicide Risk:  Minimal: No identifiable suicidal ideation.  Patients presenting with no risk factors but with morbid ruminations; may be classified as minimal risk based on the severity of the depressive symptoms  Follow-up Information    [facility]users, [facility] Follow up.   Why:  Phone interview on 1/16 to determine eligiblility for assessment and to possibly schedule admission.  Contact information: [address] [place] [address] [phone]        [facility], [facility] Follow up on 03/21/2017.   Why:  Please walk in at 9:00AM on Thursday, 1/17 for hospital follow-up appt/assessment. Please bring: photo ID, social security card, proof of household income, and hospital discharge paperwork. Thank you.  Contact information: [address] [place] [address] [phone]         Subjective Data:  Jeffery Hancock is a 27 y/o M with history of MDD,  alcohol use disorder, and cocaine use disorder who was admitted voluntarily with worsening depression, SI with plan to jump in front of a train, and worsening use of  cocaine and alcohol. [facility]; he was started on CIWA protocol with librium. He was started on combination of duloxetine and abilify. His symptoms gradually improved and he was able to engage with SW team about treatment plans for after discharge. He indicated that he would like to pursue long-term treatment and he has arranged an interview with [facility] for tomorrow.  Today upon evaluation, pt reports he is doing well overall. He reports his mood is "good" in regards to his plan to have his phone interview tomorrow. He denies SI/HI/AH/VH. He is sleeping well and his appetite is good. He is future oriented about his outpatient follow up, and he plans to stay at his mother's home to avoid triggers for using prior to getting into substance treatment. He is tolerating his current medication regimen without difficulty or side effects, and he plans to continue his current regimen without changes. He was able to engage in safety planning including [facility] or contact emergency services if he feels unable to maintain his own safety or the safety of others. Pt had no further questions, comments, or concerns.    Plan Of Care/Follow-up recommendations:   - Discharge to outpatient level of care  -MDD   - Continue Cymbalta 60 mg PO Daily.  Mood control.   - Continue Abilify 2 mg PO Daily.  Anxiety.   - Continue Vistaril 25 mg PO Q6H PRN.  Insomnia.   - ContinueTrazodone 100 mg PO QHS.  Activity:  as tolerated Diet:  normal Tests:  NA Other:  see above for DC plan  Micheal Likenshristopher T Abel Hageman, MD 03/19/2017, 9:42 AM

## 2017-03-19 NOTE — Progress Notes (Signed)
Patient ID: Carloyn JaegerMark Maffia, male   DOB: 12/26/1990, 27 y.o.   MRN: [id number] Pt d/c to home with mother. D/c instructions, rx's, medications, and suicide prevention information given and reviewed. Pt verbalizes understanding. Pt denies s.i.

## 2017-10-13 ENCOUNTER — Inpatient Hospital Stay (HOSPITAL_COMMUNITY)
Admission: RE | Admit: 2017-10-13 | Discharge: 2017-10-18 | DRG: 885 | Disposition: A | Discharge status: Home / Self Care | Payer: Federal, State, Local not specified - Other | Attending: Psychiatry | Admitting: Psychiatry

## 2017-10-13 ENCOUNTER — Ambulatory Visit (HOSPITAL_COMMUNITY)
Admission: RE | Admit: 2017-10-13 | Discharge: 2017-10-13 | Disposition: A | Discharge status: Home / Self Care | Payer: Self-pay | Attending: Psychiatry | Admitting: Psychiatry

## 2017-10-13 ENCOUNTER — Encounter (HOSPITAL_COMMUNITY): Payer: Self-pay | Admitting: *Deleted

## 2017-10-13 ENCOUNTER — Other Ambulatory Visit: Payer: Self-pay

## 2017-10-13 ENCOUNTER — Emergency Department (HOSPITAL_COMMUNITY)
Admission: EM | Admit: 2017-10-13 | Discharge: 2017-10-13 | Disposition: A | Discharge status: Other Acute Inpatient Hospital | Payer: Self-pay | Attending: Emergency Medicine | Admitting: Emergency Medicine

## 2017-10-13 DIAGNOSIS — F332 Major depressive disorder, recurrent severe without psychotic features: Secondary | ICD-10-CM | POA: Insufficient documentation

## 2017-10-13 DIAGNOSIS — Z560 Unemployment, unspecified: Secondary | ICD-10-CM

## 2017-10-13 DIAGNOSIS — F1910 Other psychoactive substance abuse, uncomplicated: Secondary | ICD-10-CM | POA: Diagnosis present

## 2017-10-13 DIAGNOSIS — G4700 Insomnia, unspecified: Secondary | ICD-10-CM | POA: Diagnosis present

## 2017-10-13 DIAGNOSIS — F419 Anxiety disorder, unspecified: Secondary | ICD-10-CM | POA: Diagnosis present

## 2017-10-13 DIAGNOSIS — F322 Major depressive disorder, single episode, severe without psychotic features: Secondary | ICD-10-CM | POA: Diagnosis present

## 2017-10-13 DIAGNOSIS — R45851 Suicidal ideations: Secondary | ICD-10-CM | POA: Diagnosis present

## 2017-10-13 DIAGNOSIS — E785 Hyperlipidemia, unspecified: Secondary | ICD-10-CM | POA: Diagnosis present

## 2017-10-13 DIAGNOSIS — Z79899 Other long term (current) drug therapy: Secondary | ICD-10-CM

## 2017-10-13 DIAGNOSIS — F1020 Alcohol dependence, uncomplicated: Secondary | ICD-10-CM | POA: Insufficient documentation

## 2017-10-13 DIAGNOSIS — F1420 Cocaine dependence, uncomplicated: Secondary | ICD-10-CM | POA: Insufficient documentation

## 2017-10-13 DIAGNOSIS — F17210 Nicotine dependence, cigarettes, uncomplicated: Secondary | ICD-10-CM | POA: Insufficient documentation

## 2017-10-13 DIAGNOSIS — Z818 Family history of other mental and behavioral disorders: Secondary | ICD-10-CM

## 2017-10-13 DIAGNOSIS — J45909 Unspecified asthma, uncomplicated: Secondary | ICD-10-CM | POA: Diagnosis present

## 2017-10-13 DIAGNOSIS — Z23 Encounter for immunization: Secondary | ICD-10-CM

## 2017-10-13 DIAGNOSIS — F1520 Other stimulant dependence, uncomplicated: Secondary | ICD-10-CM | POA: Insufficient documentation

## 2017-10-13 DIAGNOSIS — F1120 Opioid dependence, uncomplicated: Secondary | ICD-10-CM | POA: Insufficient documentation

## 2017-10-13 LAB — CBC
HEMATOCRIT: 45.2 % (ref 39.0–52.0)
Hemoglobin: 16.1 g/dL (ref 13.0–17.0)
MCH: 31.8 pg (ref 26.0–34.0)
MCHC: 35.6 g/dL (ref 30.0–36.0)
MCV: 89.3 fL (ref 78.0–100.0)
Platelets: 256 10*3/uL (ref 150–400)
RBC: 5.06 MIL/uL (ref 4.22–5.81)
RDW: 13.1 % (ref 11.5–15.5)
WBC: 8.4 10*3/uL (ref 4.0–10.5)

## 2017-10-13 LAB — COMPREHENSIVE METABOLIC PANEL
ALBUMIN: 4 g/dL (ref 3.5–5.0)
ALK PHOS: 71 U/L (ref 38–126)
ALT: 25 U/L (ref 0–44)
ANION GAP: 9 (ref 5–15)
AST: 24 U/L (ref 15–41)
BILIRUBIN TOTAL: 0.3 mg/dL (ref 0.3–1.2)
BUN: 13 mg/dL (ref 6–20)
CALCIUM: 9.1 mg/dL (ref 8.9–10.3)
CO2: 23 mmol/L (ref 22–32)
CREATININE: 0.79 mg/dL (ref 0.61–1.24)
Chloride: 107 mmol/L (ref 98–111)
GFR calc Af Amer: >60 mL/min (ref >60)
GFR calc non Af Amer: >60 mL/min (ref >60)
GLUCOSE: 92 mg/dL (ref 70–99)
Potassium: 3.9 mmol/L (ref 3.5–5.1)
Sodium: 139 mmol/L (ref 135–145)
TOTAL PROTEIN: 7.7 g/dL (ref 6.5–8.1)

## 2017-10-13 LAB — SALICYLATE LEVEL: Salicylate Lvl: <7 mg/dL (ref 2.8–30.0)

## 2017-10-13 LAB — RAPID URINE DRUG SCREEN, HOSP PERFORMED
Amphetamines: POSITIVE — AB
Barbiturates: NOT DETECTED
Benzodiazepines: POSITIVE — AB
Cocaine: POSITIVE — AB
Opiates: NOT DETECTED
Tetrahydrocannabinol: NOT DETECTED

## 2017-10-13 LAB — ACETAMINOPHEN LEVEL

## 2017-10-13 LAB — ETHANOL: Alcohol, Ethyl (B): <10 mg/dL (ref <10)

## 2017-10-13 MED ORDER — ALBUTEROL SULFATE HFA 108 (90 BASE) MCG/ACT IN AERS: 1.0000 | INHALATION_SPRAY | Freq: Four times a day (QID) | RESPIRATORY_TRACT | Status: DC | PRN

## 2017-10-13 MED ORDER — HYDROXYZINE HCL 25 MG PO TABS
25.0000 mg | ORAL_TABLET | Freq: Four times a day (QID) | ORAL | Status: DC | PRN
  Administered 2017-10-13: 25 mg via ORAL
  Filled 2017-10-13: qty 1

## 2017-10-13 MED ORDER — PNEUMOCOCCAL VAC POLYVALENT 25 MCG/0.5ML IJ INJ
0.5000 mL | INJECTION | INTRAMUSCULAR | Status: AC
  Administered 2017-10-15: 0.5 mL via INTRAMUSCULAR

## 2017-10-13 MED ORDER — IBUPROFEN 200 MG PO TABS
400.0000 mg | ORAL_TABLET | Freq: Every day | ORAL | Status: DC
  Administered 2017-10-13: 400 mg via ORAL
  Filled 2017-10-13: qty 2

## 2017-10-13 MED ORDER — ALUM & MAG HYDROXIDE-SIMETH 200-200-20 MG/5ML PO SUSP: 30.0000 mL | ORAL | Status: DC | PRN

## 2017-10-13 MED ORDER — LOPERAMIDE HCL 2 MG PO CAPS: 2.0000 mg | ORAL_CAPSULE | ORAL | Status: DC | PRN

## 2017-10-13 MED ORDER — CLONIDINE HCL 0.1 MG PO TABS
0.1000 mg | ORAL_TABLET | Freq: Four times a day (QID) | ORAL | Status: DC
  Administered 2017-10-13 – 2017-10-14 (×3): 0.1 mg via ORAL
  Filled 2017-10-13 (×11): qty 1

## 2017-10-13 MED ORDER — METHOCARBAMOL 500 MG PO TABS: 500.0000 mg | ORAL_TABLET | Freq: Three times a day (TID) | ORAL | Status: DC | PRN

## 2017-10-13 MED ORDER — ONDANSETRON 4 MG PO TBDP: 4.0000 mg | ORAL_TABLET | Freq: Four times a day (QID) | ORAL | Status: DC | PRN

## 2017-10-13 MED ORDER — TRAZODONE HCL 100 MG PO TABS
200.0000 mg | ORAL_TABLET | Freq: Every day | ORAL | Status: DC
  Administered 2017-10-13: 200 mg via ORAL
  Filled 2017-10-13: qty 2

## 2017-10-13 MED ORDER — NICOTINE 21 MG/24HR TD PT24
21.0000 mg | MEDICATED_PATCH | Freq: Every day | TRANSDERMAL | Status: DC
Start: 2017-10-14 — End: 2017-10-18
  Administered 2017-10-14 – 2017-10-18 (×5): 21 mg via TRANSDERMAL
  Filled 2017-10-13 (×7): qty 1

## 2017-10-13 MED ORDER — LORATADINE 10 MG PO TABS: 10.0000 mg | ORAL_TABLET | Freq: Every day | ORAL | Status: DC

## 2017-10-13 MED ORDER — TRAZODONE HCL 100 MG PO TABS
200.0000 mg | ORAL_TABLET | Freq: Every day | ORAL | Status: DC
  Filled 2017-10-13 (×3): qty 2

## 2017-10-13 MED ORDER — ARIPIPRAZOLE 2 MG PO TABS
2.0000 mg | ORAL_TABLET | Freq: Every day | ORAL | Status: DC
Start: 2017-10-14 — End: 2017-10-14
  Administered 2017-10-14: 2 mg via ORAL
  Filled 2017-10-13 (×3): qty 1

## 2017-10-13 MED ORDER — LORATADINE 10 MG PO TABS
10.0000 mg | ORAL_TABLET | Freq: Every day | ORAL | Status: DC
  Administered 2017-10-14 – 2017-10-18 (×5): 10 mg via ORAL
  Filled 2017-10-13 (×7): qty 1

## 2017-10-13 MED ORDER — ARIPIPRAZOLE 2 MG PO TABS
2.0000 mg | ORAL_TABLET | Freq: Every day | ORAL | Status: DC
  Administered 2017-10-13: 2 mg via ORAL
  Filled 2017-10-13: qty 1

## 2017-10-13 MED ORDER — ALBUTEROL SULFATE HFA 108 (90 BASE) MCG/ACT IN AERS
1.0000 | INHALATION_SPRAY | Freq: Four times a day (QID) | RESPIRATORY_TRACT | Status: DC | PRN
  Administered 2017-10-17: 2 via RESPIRATORY_TRACT
  Filled 2017-10-13: qty 6.7

## 2017-10-13 MED ORDER — HYDROXYZINE HCL 25 MG PO TABS: 25.0000 mg | ORAL_TABLET | Freq: Four times a day (QID) | ORAL | Status: DC | PRN

## 2017-10-13 MED ORDER — CLONIDINE HCL 0.1 MG PO TABS
0.1000 mg | ORAL_TABLET | ORAL | Status: DC
  Filled 2017-10-13: qty 1

## 2017-10-13 MED ORDER — DULOXETINE HCL 30 MG PO CPEP: 60.0000 mg | ORAL_CAPSULE | Freq: Every day | ORAL | Status: DC

## 2017-10-13 MED ORDER — DICYCLOMINE HCL 20 MG PO TABS: 20.0000 mg | ORAL_TABLET | Freq: Four times a day (QID) | ORAL | Status: DC | PRN

## 2017-10-13 MED ORDER — CHLORDIAZEPOXIDE HCL 25 MG PO CAPS: 25.0000 mg | ORAL_CAPSULE | Freq: Four times a day (QID) | ORAL | Status: DC | PRN

## 2017-10-13 MED ORDER — CLONIDINE HCL 0.1 MG PO TABS: 0.1000 mg | ORAL_TABLET | Freq: Every day | ORAL | Status: DC

## 2017-10-13 MED ORDER — DULOXETINE HCL 60 MG PO CPEP
60.0000 mg | ORAL_CAPSULE | Freq: Every day | ORAL | Status: DC
  Administered 2017-10-14 – 2017-10-18 (×5): 60 mg via ORAL
  Filled 2017-10-13 (×2): qty 1
  Filled 2017-10-13: qty 7
  Filled 2017-10-13 (×4): qty 1

## 2017-10-13 MED ORDER — ACETAMINOPHEN 325 MG PO TABS
650.0000 mg | ORAL_TABLET | Freq: Four times a day (QID) | ORAL | Status: DC | PRN
  Administered 2017-10-14 – 2017-10-18 (×7): 650 mg via ORAL
  Filled 2017-10-13 (×8): qty 2

## 2017-10-13 MED ORDER — MAGNESIUM HYDROXIDE 400 MG/5ML PO SUSP: 30.0000 mL | Freq: Every day | ORAL | Status: DC | PRN

## 2017-10-13 MED ORDER — NAPROXEN 500 MG PO TABS
500.0000 mg | ORAL_TABLET | Freq: Two times a day (BID) | ORAL | Status: DC | PRN
  Administered 2017-10-14: 500 mg via ORAL
  Filled 2017-10-13: qty 1

## 2017-10-13 NOTE — ED Provider Notes (Signed)
[facility]-EMERGENCY DEPT Provider Note   CSN: [id number] Arrival date & time: 10/13/17  2054     History   Chief Complaint Chief Complaint  Patient presents with  . Suicidal    HPI Jeffery Hancock is a 27 y.o. male.  HPI   Patient presents with complaint of suicidal ideation with thoughts of "being run over by a train."  He has a history of polysubstance abuse.  He states he resumed multiple substances including heroin, crack cocaine, alcohol and methamphetamine since being [facility] ago.  He was living with his mother but she kicked him out because of substance abuse.  He is not seeing a therapist but states he is taking his medications as prescribed.  He denies recent fever, chills, nausea, vomiting, cough, shortness of breath, weakness or dizziness.  There are no other known modifying factors.  Past Medical History:  Diagnosis Date  . Alcohol abuse   . Anxiety   . Depression   . Hyperlipemia   . Panic attacks   . Polysubstance abuse (HCC)    crack, oxycodone, heroin, etoh    Patient Active Problem List   Diagnosis Date Noted  . MDD (major depressive disorder), severe (HCC) 03/14/2017  . MDD (major depressive disorder), recurrent severe, without psychosis (HCC) 08/02/2014  . Alcohol use disorder, severe, dependence (HCC) 08/02/2014  . Alcohol withdrawal (HCC) 08/02/2014  . 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 Cocaine use disorder, moderate, dependence (HCC) 08/02/2014  . Substance induced mood disorder (HCC) 07/30/2014    No past surgical history on file.      Home Medications    Prior to Admission medications   Medication Sig Start Date End Date Taking? Authorizing Provider  albuterol (PROVENTIL HFA;VENTOLIN HFA) 108 (90 Base) MCG/ACT inhaler Inhale 1-2 puffs into the lungs every 6 (six) hours as needed for  wheezing or shortness of breath. 03/19/17  Yes Armandina Stammer I, NP  ARIPiprazole (ABILIFY) 2 MG tablet Take 1 tablet (2 mg total) by mouth daily. For mood control/adjunct to antidepressant 03/20/17  Yes Nwoko, Nicole Kindred I, NP  clonazePAM (KLONOPIN PO) Take 2 mg by mouth once.   Yes [provider]  DULoxetine (CYMBALTA) 60 MG capsule Take 1 capsule (60 mg total) by mouth daily. For depression 03/20/17  Yes Armandina Stammer I, NP  hydrOXYzine (ATARAX/VISTARIL) 25 MG tablet Take 1 tablet (25 mg total) by mouth every 6 (six) hours as needed for anxiety. 03/19/17  Yes Armandina Stammer I, NP  ibuprofen (ADVIL,MOTRIN) 200 MG tablet Take 400 mg by mouth daily.   Yes [provider]  loratadine (CLARITIN) 10 MG tablet Take 10 mg by mouth daily.   Yes [provider]  traZODone (DESYREL) 100 MG tablet Take 1 tablet (100 mg total) by mouth at bedtime. For sleep Patient taking differently: Take 200 mg by mouth at bedtime. For sleep 03/19/17  Yes Nwoko, Nicole Kindred I, NP  nicotine (NICODERM CQ - DOSED IN MG/24 HOURS) 21 mg/24hr patch Place 1 patch (21 mg total) onto the skin daily. (May purchase from over the counter): For smoking cessation Patient not taking: Reported on 10/13/2017 03/20/17   Sanjuana Kava, NP    Family History Family History  Problem Relation Age of Onset  . Diabetes Other   . Hypertension Other   . Cancer Other   . Heart failure Other   . Alcohol abuse Father   .  Depression Brother   . Bipolar disorder Brother     Social History Social History   Tobacco Use  . Smoking status: Current Some Day Smoker    Packs/day: 1.00    Types: Cigarettes  . Smokeless tobacco: Never Used  Substance Use Topics  . Alcohol use: Yes    Comment: drinks daily- 4-5 40 oz of beer  . Drug use: Yes    Types: Benzodiazepines, IV, Cocaine    Comment: heroin     Allergies   Patient has no known allergies.   Review of Systems Review of Systems  All other systems reviewed and are  negative.    Physical Exam Updated Vital Signs BP (!) 146/86 (BP Location: Left Arm)   Pulse 82   Temp 98.6 F (37 C) (Oral)   Resp 18   Ht 6' (1.829 m)   Wt 124.7 kg   SpO2 99%   BMI 37.30 kg/m   Physical Exam  Constitutional: He is oriented to person, place, and time. He appears well-developed and well-nourished.  HENT:  Head: Normocephalic and atraumatic.  Right Ear: External ear normal.  Left Ear: External ear normal.  Eyes: Pupils are equal, round, and reactive to light. Conjunctivae and EOM are normal.  Neck: Normal range of motion and phonation normal. Neck supple.  Cardiovascular: Normal rate.  Pulmonary/Chest: Effort normal. He exhibits no bony tenderness.  Musculoskeletal: Normal range of motion. He exhibits no edema or deformity.  Neurological: He is alert and oriented to person, place, and time. No cranial nerve deficit or sensory deficit. He exhibits normal muscle tone. Coordination normal.  Skin: Skin is warm, dry and intact.  Psychiatric: He has a normal mood and affect. His behavior is normal. Judgment and thought content normal.  Nursing note and vitals reviewed.    ED Treatments / Results  Labs (all labs ordered are listed, but only abnormal results are displayed) Labs Reviewed  ACETAMINOPHEN LEVEL - Abnormal; Notable for the following components:      Result Value   Acetaminophen (Tylenol), Serum <10 (*)    All other components within normal limits  RAPID URINE DRUG SCREEN, HOSP PERFORMED - Abnormal; Notable for the following components:   Cocaine POSITIVE (*)    Benzodiazepines POSITIVE (*)    Amphetamines POSITIVE (*)    All other components within normal limits  COMPREHENSIVE METABOLIC PANEL  ETHANOL  SALICYLATE LEVEL  CBC    EKG None  Radiology No results found.  Procedures Procedures (including critical care time)  Medications Ordered in ED Medications  albuterol (PROVENTIL HFA;VENTOLIN HFA) 108 (90 Base) MCG/ACT inhaler 1-2  puff (has no administration in time range)  ARIPiprazole (ABILIFY) tablet 2 mg (2 mg Oral Given 10/13/17 2217)  DULoxetine (CYMBALTA) DR capsule 60 mg (has no administration in time range)  hydrOXYzine (ATARAX/VISTARIL) tablet 25 mg (25 mg Oral Given 10/13/17 2216)  ibuprofen (ADVIL,MOTRIN) tablet 400 mg (400 mg Oral Given 10/13/17 2216)  loratadine (CLARITIN) tablet 10 mg (has no administration in time range)  traZODone (DESYREL) tablet 200 mg (200 mg Oral Given 10/13/17 2216)     Initial Impression / Assessment and Plan / ED Course  I have reviewed the triage vital signs and the nursing notes.  Pertinent labs & imaging results that were available during my care of the patient were reviewed by me and considered in my medical decision making (see chart for details).  Clinical Course as of Oct 13 2224  Wynelle LinkSun Oct 13, 2017  2225 Normal  except polysubstance abuse with cocaine, benzodiazepines and amphetamines.  Rapid urine drug screen (hospital performed)(!) [EW]  2225 Normal  Comprehensive metabolic panel [EW]  2225 Normal  Acetaminophen level(!) [EW]  2225 Normal  Salicylate level [EW]  2225 Normal  Ethanol [EW]  2226 Normal  cbc [EW]  2226 At this point the patient is medically cleared for treatment by psychiatry.   [EW]    Clinical Course User Index [EW] Mancel Bale, MD     Patient Vitals for the past 24 hrs:  BP Temp Temp src Pulse Resp SpO2 Height Weight  10/13/17 2110 - - - - - - 6' (1.829 m) 124.7 kg  10/13/17 2101 (!) 146/86 98.6 F (37 C) Oral 82 18 99 % - -    10:26 PM Reevaluation with update and discussion. After initial assessment and treatment, an updated evaluation reveals no change in clinical status.  Patient is medically cleared. Mancel Bale   Medical Decision Making: Polysubstance abuse with suicidal ideation.  Patient will require admission by psychiatry  CRITICAL CARE-no Performed by: Mancel Bale   Nursing Notes Reviewed/ Care  Coordinated Applicable Imaging Reviewed Interpretation of Laboratory Data incorporated into ED treatment   Plan: As per TTS in conjunction with oncoming provider team    Final Clinical Impressions(s) / ED Diagnoses   Final diagnoses:  Suicidal ideation  Polysubstance abuse [facility])    ED Discharge Orders    None       Mancel Bale, MD 10/13/17 2227

## 2017-10-13 NOTE — ED Notes (Signed)
[facility] called for transport. 

## 2017-10-13 NOTE — ED Triage Notes (Signed)
Pt to ED with complaints of suicidal ideation with thoughts of being ran over by a train. Pt states that at this moment he has not intention on hurting himself here. Pt states he is "tired of living this way and ready to be helped"

## 2017-10-13 NOTE — Progress Notes (Signed)
[facility] Patient presents with SI with thoughts to lie in front of a train. Although he reports compliance, patient states meds stopped working "like they were" and patient relapsed on substances. Reports using heroin, cocaine, alcohol, and methamphetamines. States he took one klonopin today "so I wouldn't feel any withdrawal. I usually drink 3-5 40 oz beers a day." BAL neg, UDS +cocaine, amphetamines, and benzos. Affect flat, mood depressed. Patient disheveled with foul body odor. No PMH. Denies pain, physical problems. Denies withdrawal symptoms, CIWA is a "0" and COWS is "1". VSS.   Patient's skin and clothing searched, belongings secured. Level III obs initiated. Oriented to unit and emotional support provided. Reassured of safety. Fall prevention plan reviewed and in place.  Patient verbalizes understanding of POC. He denies SI  "now that I'm here". No HI/AVH. Remains safe at this time.

## 2017-10-13 NOTE — Tx Team (Signed)
Initial Treatment Plan 10/13/2017 11:49 PM Carloyn JaegerMark Abruzzo XBM:[id number]    PATIENT STRESSORS: Legal issue Loss of housing Occupational concerns Substance abuse   PATIENT STRENGTHS: Average or above average intelligence Communication skills General fund of knowledge Motivation for treatment/growth Physical Health Religious Affiliation Supportive family/friends   PATIENT IDENTIFIED PROBLEMS:   "Try to get meds right."    "Get away from drugs again."               DISCHARGE CRITERIA:  Improved stabilization in mood, thinking, and/or behavior Need for constant or close observation no longer present Reduction of life-threatening or endangering symptoms to within safe limits Verbal commitment to aftercare and medication compliance Withdrawal symptoms are absent or subacute and managed without 24-hour nursing intervention  PRELIMINARY DISCHARGE PLAN: Attend 12-step recovery group Placement in alternative living arrangements  PATIENT/FAMILY INVOLVEMENT: This treatment plan has been presented to and reviewed with the patient, Carloyn JaegerMark Buckbee, and/or family member.  The patient and family have been given the opportunity to ask questions and make suggestions.  Lawrence MarseillesFriedman, Angeliki Mates Eakes, RN 10/13/2017, 11:49 PM

## 2017-10-13 NOTE — BH Assessment (Signed)
Assessment Note  Carloyn JaegerMark Endo is an 27 y.o. single male who [facility] reporting symptoms of depression, suicidal ideation and substance abuse. Pt has a history of major depression and says he believes his psychiatric medication is not helping. He describes recurring suicidal ideation for the past 3-4 days with plan to lie on train tracks. Pt reports he has stood beside train tracks while intoxicated and considered lying on the tracks but has not done so. He describes his mood as "miserable." Pt acknowledges symptoms including social withdrawal, loss of interest in usual pleasures, fatigue, irritability, decreased concentration, decreased sleep, decreased appetite and feelings of guilt and hopelessness. He denies history of suicide attempts on intentional self-injurious behavior. He denies current homicidal ideation or history of violence. He denies any history of psychotic symptoms.  Pt reports a history of using a variety of substances. He reports he is currently using alcohol, cocaine, heroin and methamphetamines on a regular basis (see details of use below). He reports he took Klonopin today to relieve withdrawal symptoms but denies taking benzodiazepines on a regular basis. He reports his longest period of sobriety is six months, which occurred two years ago.   Pt identifies consequences of substance use as his primary stressor. He says he spends all his time either using drugs or trying to acquire drugs. He says he is essentially homeless and has been staying in motels when he can. He says he is currently unemployed. He says he has a court date scheduled 10/23/17 for resisting arrest. He says he has "awesome" family support and also identifies his pastor as very supportive. He identifies trauma of finding his brother dead several years ago from an accidental overdose on heroin and Fentanyl. He denies history of abuse.  Pt reports he [facility]. He is not currently seeing a therapist. He reports he is prescribed Cymbalta 60 mg daily, Trazodone 200 mg at night and Vistaril 25 mg at night and takes medications as prescribed. He confirms he [facility], [facility] and [facility].  Pt is casually dressed, unshaven and overweight. He is alert and oriented x4. Pt speaks in a clear tone, at moderate volume and fast pace. Motor behavior appears restless and Pt acknowledges he is experiencing withdrawal. Eye contact is good. Pt's mood is depressed and anxious, affect is anxious. Thought process is coherent and relevant. There is no indication Pt is currently responding to internal stimuli or experiencing delusional thought content. Pt was pleasant and cooperative throughout assessment. He says he is willing to sign voluntarily into a psychiatric facility.    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 Alcohol abuse   . Anxiety   . Depression   . Hyperlipemia   . Panic attacks   . Polysubstance abuse (HCC)    crack, oxycodone, heroin, etoh    No past surgical history on file.  Family History:  Family History  Problem Relation Age of Onset  . Diabetes Other   . Hypertension Other   . Cancer Other   . Heart failure Other   . Alcohol abuse Father   . Depression Brother   . Bipolar disorder Brother  Social History:  reports that he has been smoking cigarettes. He has been smoking about 1.00 pack per day. He has never used smokeless tobacco. He reports that he drinks alcohol. He reports that he has current or past drug history. Drugs: Benzodiazepines, IV, and Cocaine.  Additional  Social History:  Alcohol / Drug Use Pain Medications: Pt denies Prescriptions: Pt reports Cymbalta, Vistoril Over the Counter: K supplement History of alcohol / drug use?: Yes Longest period of sobriety (when/how long): Six months Negative Consequences of Use: Financial, Legal, Personal relationships, Work / School Withdrawal Symptoms: Agitation, Tachycardia, Tremors Substance #1 Name of Substance 1: Cocaine (crack) 1 - Age of First Use: 18 1 - Amount (size/oz): $20-500 worth 1 - Frequency: Daily 1 - Duration: Ongoing 1 - Last Use / Amount: 10/12/17 Substance #2 Name of Substance 2: Alcohol 2 - Age of First Use: 27 years of age 50 - Amount (size/oz): 3-4 40-ounce beers 2 - Frequency: Daily 2 - Duration: Ongoing 2 - Last Use / Amount: 10/12/17 Substance #3 Name of Substance 3: Heroin (I.V.) 3 - Age of First Use: 25 3 - Amount (size/oz): $20 worth 3 - Frequency: Two to three times per week 3 - Duration: Ongoing 3 - Last Use / Amount: 10/10/17 Substance #4 Name of Substance 4: Methamphetamines 4 - Age of First Use: 26 4 - Amount (size/oz): $40-60 worth 4 - Frequency: 1-2 times per week 4 - Duration: Ongoing 4 - Last Use / Amount: 10/11/17  CIWA:   COWS:    Allergies: No Known Allergies  Home Medications:  (Not in a hospital admission)  OB/GYN Status:  No LMP for male patient.  General Assessment Data Location of Assessment: [facility] Assessment Services TTS Assessment: In system Is this a Tele or Face-to-Face Assessment?: Face-to-Face Is this an Initial Assessment or a Re-assessment for this encounter?: Initial Assessment Marital status: Single Maiden name: NA Is patient pregnant?: No Pregnancy Status: No Living Arrangements: Other (Comment)(Homeless, staying in motels) Can pt return to current living arrangement?: Yes Admission Status: Voluntary Is patient capable of signing voluntary admission?: Yes Referral Source: Self/Family/Friend Insurance type:  Self-pay  Medical Screening Exam [facility] Walk-in ONLY) Medical Exam completed: Teacher, early years/pre, PA)  Crisis Care Plan Living Arrangements: Other (Comment)(Homeless, staying in motels) Legal Guardian: Other:(Self) Name of Psychiatrist: [facility] Name of Therapist: None  Education Status Is patient currently in school?: No Is the patient employed, unemployed or receiving disability?: Unemployed  Risk to self with the past 6 months Suicidal Ideation: Yes-Currently Present Has patient been a risk to self within the past 6 months prior to admission? : Yes Suicidal Intent: Yes-Currently Present Has patient had any suicidal intent within the past 6 months prior to admission? : Yes Is patient at risk for suicide?: Yes Suicidal Plan?: Yes-Currently Present Has patient had any suicidal plan within the past 6 months prior to admission? : Yes Specify Current Suicidal Plan: Pt reports plan to lie on train tracks Access to Means: Yes Specify Access to Suicidal Means: Aware of location of train tracks What has been your use of drugs/alcohol within the last 12 months?: Pt reports using alcohol, cocaine, meth and heroin Previous Attempts/Gestures: No How many times?: 0 Other Self Harm Risks: Pt reports he infrequently punches walls Triggers for Past Attempts: None known Intentional Self Injurious Behavior: None Family Suicide History: No Recent stressful life event(s): Legal Issues, Financial Problems Persecutory voices/beliefs?: No Depression: Yes Depression Symptoms: Despondent, Insomnia, Isolating, Fatigue, Guilt, Loss of interest in usual  pleasures, Feeling worthless/self pity, Feeling angry/irritable Substance abuse history and/or treatment for substance abuse?: Yes Suicide prevention information given to non-admitted patients: Not applicable  Risk to Others within the past 6 months Homicidal Ideation: No Does patient have any lifetime risk of violence toward others beyond the six months  prior to admission? : No Thoughts of Harm to Others: No Current Homicidal Intent: No Current Homicidal Plan: No Access to Homicidal Means: No Identified Victim: None History of harm to others?: No Assessment of Violence: None Noted Violent Behavior Description: None Does patient have access to weapons?: No Criminal Charges Pending?: Yes Describe Pending Criminal Charges: Resisting arrest Does patient have a court date: Yes Court Date: 10/23/17 Is patient on probation?: No  Psychosis Hallucinations: None noted Delusions: None noted  Mental Status Report Appearance/Hygiene: Other (Comment)(Casually dressed) Eye Contact: Good Motor Activity: Restlessness Speech: Logical/coherent Level of Consciousness: Alert Mood: Depressed, Anxious Affect: Anxious Anxiety Level: Severe Thought Processes: Coherent, Relevant Judgement: Impaired Orientation: Person, Place, Time, Situation, Appropriate for developmental age Obsessive Compulsive Thoughts/Behaviors: None  Cognitive Functioning Concentration: Fair Memory: Recent Intact, Remote Intact Is patient IDD: No Is patient DD?: No Insight: Fair Impulse Control: Fair Appetite: Poor Have you had any weight changes? : No Change Sleep: Decreased Total Hours of Sleep: 4 Vegetative Symptoms: None  ADLScreening [facility] Assessment Services) Patient's cognitive ability adequate to safely complete daily activities?: Yes Patient able to express need for assistance with ADLs?: Yes Independently performs ADLs?: Yes (appropriate for developmental age)  Prior Inpatient Therapy Prior Inpatient Therapy: Yes Prior Therapy Dates: 2019, 2018, 2016 Prior Therapy Facilty/Provider(s): [facility], [facility] Reason for Treatment: MDD, SA  Prior Outpatient Therapy Prior Outpatient Therapy: Yes Prior Therapy Dates: Current Prior Therapy Facilty/Provider(s): [facility] Reason for Treatment: MDD Does patient have an ACCT team?: No Does patient have  Intensive In-House Services?  : No Does patient have [facility] services? : No Does patient have P4CC services?: No  ADL Screening (condition at time of admission) Patient's cognitive ability adequate to safely complete daily activities?: Yes Is the patient deaf or have difficulty hearing?: No Does the patient have difficulty seeing, even when wearing glasses/contacts?: No Does the patient have difficulty concentrating, remembering, or making decisions?: No Patient able to express need for assistance with ADLs?: Yes Does the patient have difficulty dressing or bathing?: No Independently performs ADLs?: Yes (appropriate for developmental age) Does the patient have difficulty walking or climbing stairs?: No Weakness of Legs: None Weakness of Arms/Hands: None  Home Assistive Devices/Equipment Home Assistive Devices/Equipment: None    Abuse/Neglect Assessment (Assessment to be complete while patient is alone) Abuse/Neglect Assessment Can Be Completed: Yes Physical Abuse: Denies Verbal Abuse: Denies Sexual Abuse: Denies Exploitation of patient/patient's resources: Denies Self-Neglect: Denies     Merchant navy officer (For Healthcare) Does Patient Have a Medical Advance Directive?: No Would patient like information on creating a medical advance directive?: No - Patient declined    Additional Information 1:1 In Past 12 Months?: No CIRT Risk: No Elopement Risk: No Does patient have medical clearance?: No     Disposition: Binnie Rail, AC at [facility], confirmed bed availability. Gave clinical report to Donell Sievert, PA who completed MSE and said Pt meets criteria for inpatient psychiatric treatment once medically cleared. TTS contacted Consuella Lose, Consulting civil engineer at [facility], and gave report. Pt transported to [facility] via [employer] and [facility] staff.  Disposition Initial Assessment Completed for this Encounter: Yes Disposition of Patient: Admit  On Site Evaluation by:  Donell Sievert,  PA Reviewed with Physician:    Harlin Rain Patsy Baltimore, [facility], [facility], [facility] Triage Specialist [phone]  Patsy Baltimore, Harlin Rain 10/13/2017 8:09 PM

## 2017-10-13 NOTE — Progress Notes (Signed)
[facility], to Dr. Jola Babinskilary, MD pending medical clearance and [facility] EDP Mancel BaleWentz, Elliott, MD has been advised of tentative [facility] to [facility] once all pertinent information has been reviewed. TTS will contact ED staff once pt is ready to be transferred.   Princess BruinsAquicha Keyari Kleeman, MSW, LCSW Therapeutic Triage Specialist  [phone]

## 2017-10-13 NOTE — ED Notes (Signed)
Bed: [facility]4 Expected date:  Expected time:  Means of arrival:  Comments: 

## 2017-10-14 DIAGNOSIS — F322 Major depressive disorder, single episode, severe without psychotic features: Secondary | ICD-10-CM

## 2017-10-14 MED ORDER — ONDANSETRON 4 MG PO TBDP: 4.0000 mg | ORAL_TABLET | Freq: Four times a day (QID) | ORAL | Status: AC | PRN

## 2017-10-14 MED ORDER — TRAZODONE HCL 100 MG PO TABS
100.0000 mg | ORAL_TABLET | Freq: Every evening | ORAL | Status: DC | PRN
  Administered 2017-10-14 – 2017-10-17 (×4): 100 mg via ORAL
  Filled 2017-10-14: qty 1
  Filled 2017-10-14: qty 7
  Filled 2017-10-14 (×3): qty 1

## 2017-10-14 MED ORDER — ARIPIPRAZOLE 5 MG PO TABS
5.0000 mg | ORAL_TABLET | Freq: Every day | ORAL | Status: DC
  Administered 2017-10-15 – 2017-10-18 (×4): 5 mg via ORAL
  Filled 2017-10-14 (×3): qty 1
  Filled 2017-10-14: qty 7
  Filled 2017-10-14 (×2): qty 1

## 2017-10-14 MED ORDER — LOPERAMIDE HCL 2 MG PO CAPS: 2.0000 mg | ORAL_CAPSULE | ORAL | Status: DC | PRN

## 2017-10-14 MED ORDER — HYDROXYZINE HCL 25 MG PO TABS
25.0000 mg | ORAL_TABLET | Freq: Four times a day (QID) | ORAL | Status: DC | PRN
  Administered 2017-10-15 – 2017-10-16 (×2): 25 mg via ORAL
  Filled 2017-10-14 (×2): qty 1

## 2017-10-14 MED ORDER — CHLORDIAZEPOXIDE HCL 25 MG PO CAPS
25.0000 mg | ORAL_CAPSULE | Freq: Four times a day (QID) | ORAL | Status: AC | PRN
  Administered 2017-10-15 – 2017-10-16 (×5): 25 mg via ORAL
  Filled 2017-10-14 (×5): qty 1

## 2017-10-14 MED ORDER — VITAMIN B-1 100 MG PO TABS
100.0000 mg | ORAL_TABLET | Freq: Every day | ORAL | Status: DC
  Administered 2017-10-15 – 2017-10-18 (×4): 100 mg via ORAL
  Filled 2017-10-14 (×6): qty 1

## 2017-10-14 MED ORDER — ADULT MULTIVITAMIN W/MINERALS CH
1.0000 | ORAL_TABLET | Freq: Every day | ORAL | Status: DC
  Administered 2017-10-15 – 2017-10-18 (×4): 1 via ORAL
  Filled 2017-10-14 (×7): qty 1

## 2017-10-14 NOTE — Progress Notes (Signed)
D. Pt presents with an anxious affect /depressed mood- calm and cooperative behavior. Per pt's self inventory, pt rates his depression, hopelessness and anxiety an 8/7/9, respectively.  Pt currently denies SI/HI and AV hallucinations. A. Labs and vitals monitored. Pt compliant with medications. Pt supported emotionally and encouraged to express concerns and ask questions.   R. Pt remains safe with 15 minute checks. Will continue POC.
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
## 2017-10-14 NOTE — Tx Team (Signed)
Interdisciplinary Treatment and Diagnostic Plan Update  10/14/2017 Time of Session: 0830AM Jeffery Hancock MRN: [id number]  Principal Diagnosis: MDD, recurrent, severe  Secondary Diagnoses: Active Problems:   MDD (major depressive disorder), severe (H[facility])   Current Medications:  Current Facility-Administered Medications  Medication Dose Route Frequency Provider Last Rate Last Dose  . acetaminophen (TYLENOL) tablet 650 mg  650 mg Oral Q6H PRN Patriciaann Clan E, PA-C      . albuterol (PROVENTIL HFA;VENTOLIN HFA) 108 (90 Base) MCG/ACT inhaler 1-2 puff  1-2 puff Inhalation Q6H PRN Laverle Hobby, PA-C      . alum & mag hydroxide-simeth (MAALOX/MYLANTA) 200-200-20 MG/5ML suspension 30 mL  30 mL Oral Q4H PRN Laverle Hobby, PA-C      . ARIPiprazole (ABILIFY) tablet 2 mg  2 mg Oral Daily Patriciaann Clan E, PA-C   2 mg at 10/14/17 0840  . chlordiazePOXIDE (LIBRIUM) capsule 25 mg  25 mg Oral Q6H PRN Laverle Hobby, PA-C      . cloNIDine (CATAPRES) tablet 0.1 mg  0.1 mg Oral QID Patriciaann Clan E, PA-C   0.1 mg at 10/14/17 0839   Followed by  . [START ON 10/16/2017] cloNIDine (CATAPRES) tablet 0.1 mg  0.1 mg Oral BH-qamhs [name], [name], PA-C       Followed by  . [START ON 10/18/2017] cloNIDine (CATAPRES) tablet 0.1 mg  0.1 mg Oral QAC breakfast Laverle Hobby, PA-C      . dicyclomine (BENTYL) tablet 20 mg  20 mg Oral Q6H PRN Laverle Hobby, PA-C      . DULoxetine (CYMBALTA) DR capsule 60 mg  60 mg Oral Daily Laverle Hobby, PA-C   60 mg at 10/14/17 9767  . hydrOXYzine (ATARAX/VISTARIL) tablet 25 mg  25 mg Oral Q6H PRN Laverle Hobby, PA-C      . loperamide (IMODIUM) capsule 2-4 mg  2-4 mg Oral PRN Laverle Hobby, PA-C      . loratadine (CLARITIN) tablet 10 mg  10 mg Oral Daily Patriciaann Clan E, PA-C   10 mg at 10/14/17 0839  . magnesium hydroxide (MILK OF MAGNESIA) suspension 30 mL  30 mL Oral Daily PRN Laverle Hobby, PA-C      . methocarbamol (ROBAXIN) tablet 500 mg  500 mg Oral  Q8H PRN Patriciaann Clan E, PA-C      . naproxen (NAPROSYN) tablet 500 mg  500 mg Oral BID PRN Patriciaann Clan E, PA-C      . nicotine (NICODERM CQ - dosed in mg/24 hours) patch 21 mg  21 mg Transdermal Daily Patriciaann Clan E, PA-C   21 mg at 10/14/17 0841  . ondansetron (ZOFRAN-ODT) disintegrating tablet 4 mg  4 mg Oral Q6H PRN Laverle Hobby, PA-C      . pneumococcal 23 valent vaccine (PNU-IMMUNE) injection 0.5 mL  0.5 mL Intramuscular Tomorrow-1000 Cobos, Fernando A, MD      . traZODone (DESYREL) tablet 200 mg  200 mg Oral QHS Patriciaann Clan E, PA-C       PTA Medications: Medications Prior to Admission  Medication Sig Dispense Refill Last Dose  . albuterol (PROVENTIL HFA;VENTOLIN HFA) 108 (90 Base) MCG/ACT inhaler Inhale 1-2 puffs into the lungs every 6 (six) hours as needed for wheezing or shortness of breath.   10/13/2017 at Unknown time  . ARIPiprazole (ABILIFY) 2 MG tablet Take 1 tablet (2 mg total) by mouth daily. For mood control/adjunct to antidepressant 30 tablet 0 10/13/2017 at Unknown time  . clonazePAM (  KLONOPIN PO) Take 2 mg by mouth once.   10/13/2017 at Unknown time  . DULoxetine (CYMBALTA) 60 MG capsule Take 1 capsule (60 mg total) by mouth daily. For depression 30 capsule 0 10/13/2017 at Unknown time  . hydrOXYzine (ATARAX/VISTARIL) 25 MG tablet Take 1 tablet (25 mg total) by mouth every 6 (six) hours as needed for anxiety. 60 tablet 0 10/12/2017 at Unknown time  . ibuprofen (ADVIL,MOTRIN) 200 MG tablet Take 400 mg by mouth daily.   10/13/2017 at Unknown time  . loratadine (CLARITIN) 10 MG tablet Take 10 mg by mouth daily.   10/13/2017 at Unknown time  . nicotine (NICODERM CQ - DOSED IN MG/24 HOURS) 21 mg/24hr patch Place 1 patch (21 mg total) onto the skin daily. (May purchase from over the counter): For smoking cessation (Patient not taking: Reported on 10/13/2017) 28 patch 0 Not Taking at Unknown time  . traZODone (DESYREL) 100 MG tablet Take 1 tablet (100 mg total) by mouth at  bedtime. For sleep (Patient taking differently: Take 200 mg by mouth at bedtime. For sleep) 30 tablet 0 10/12/2017 at Unknown time    Patient Stressors: Legal issue Loss of housing Occupational concerns Substance abuse  Patient Strengths: Average or above average intelligence Communication skills General fund of knowledge Motivation for treatment/growth Physical Health Religious Affiliation Supportive family/friends  Treatment Modalities: Medication Management, Group therapy, Case management,  1 to 1 session with clinician, Psychoeducation, Recreational therapy.   Physician Treatment Plan for Primary Diagnosis: MDD, recurrent, severe  Medication Management: Evaluate patient's response, side effects, and tolerance of medication regimen.  Therapeutic Interventions: 1 to 1 sessions, Unit Group sessions and Medication administration.  Evaluation of Outcomes: Not Met  Physician Treatment Plan for Secondary Diagnosis: Active Problems:   MDD (major depressive disorder), severe ([place])  Medication Management: Evaluate patient's response, side effects, and tolerance of medication regimen.  Therapeutic Interventions: 1 to 1 sessions, Unit Group sessions and Medication administration.  Evaluation of Outcomes: Not Met   RN Treatment Plan for Primary Diagnosis: MDD, recurrent, severe Long Term Goal(s): Knowledge of disease and therapeutic regimen to maintain health will improve  Short Term Goals: Ability to remain free from injury will improve, Ability to verbalize frustration and anger appropriately will improve, Ability to disclose and discuss suicidal ideas and Ability to identify and develop effective coping behaviors will improve  Medication Management: RN will administer medications as ordered by provider, will assess and evaluate patient's response and provide education to patient for prescribed medication. RN will report any adverse and/or side effects to prescribing  provider.  Therapeutic Interventions: 1 on 1 counseling sessions, Psychoeducation, Medication administration, Evaluate responses to treatment, Monitor vital signs and CBGs as ordered, Perform/monitor CIWA, COWS, AIMS and Fall Risk screenings as ordered, Perform wound care treatments as ordered.  Evaluation of Outcomes: Not Met   LCSW Treatment Plan for Primary Diagnosis: MDD, recurrent, severe Long Term Goal(s): Safe transition to appropriate next level of care at discharge, Engage patient in therapeutic group addressing interpersonal concerns.  Short Term Goals: Engage patient in aftercare planning with referrals and resources, Facilitate patient progression through stages of change regarding substance use diagnoses and concerns and Identify triggers associated with mental health/substance abuse issues  Therapeutic Interventions: Assess for all discharge needs, 1 to 1 time with Social worker, Explore available resources and support systems, Assess for adequacy in community support network, Educate family and significant other(s) on suicide prevention, Complete Psychosocial Assessment, Interpersonal group therapy.  Evaluation of Outcomes: Not Met  Progress in Treatment: Attending groups: No. New to unit. Continuing to assess.  Participating in groups: No. Taking medication as prescribed: Yes. Toleration medication: Yes. Family/Significant other contact made: No, will contact:  family member if pt consents to collateral contact.  Patient understands diagnosis: Yes. Discussing patient identified problems/goals with staff: Yes. Medical problems stabilized or resolved: Yes. Denies suicidal/homicidal ideation: Yes. Issues/concerns per patient self-inventory: No. Other: n/a   New problem(s) identified: No, Describe:  n/a  New Short Term/Long Term Goal(s): detox, medication management for mood stabilization; elimination of SI thoughts; development of comprehensive mental wellness/sobriety  plan.   Patient Goals:  "to get my meds right and get away from drugs again."   Discharge Plan or Barriers: CSW assessing for appropriate referrals. [facility] pamphlet, Mobile Crisis information, and AA/NA information provided to patient for additional community support and resources.   Reason for Continuation of Hospitalization: Anxiety Depression Medication stabilization Suicidal ideation Withdrawal symptoms  Estimated Length of Stay:  Attendees: Patient: 10/14/2017 9:08 AM  Physician: Dr. Parke Poisson MD; Dr. Nancy Fetter MD 10/14/2017 9:08 AM  Nursing: Mateo Flow RN; Legrand Como RN 10/14/2017 9:08 AM  RN Care Manager:x 10/14/2017 9:08 AM  Social Worker: Janice Norrie LCSW 10/14/2017 9:08 AM  Recreational Therapist: x 10/14/2017 9:08 AM  Other: Ricky Ala NP 10/14/2017 9:08 AM  Other:  10/14/2017 9:08 AM  Other: 10/14/2017 9:08 AM    Scribe for Treatment Team: Avelina Laine, LCSW 10/14/2017 9:08 AM

## 2017-10-14 NOTE — H&P (Addendum)
Psychiatric Admission Assessment Adult  Patient Identification: Jeffery Hancock MRN:  [id number] Date of Evaluation:  10/14/2017 Chief Complaint:  mdd recurrent severe without psychotic features  Principal Diagnosis: MDD (major depressive disorder), severe ([place]) Diagnosis:   Patient Active Problem List   Diagnosis Date Noted  . MDD (major depressive disorder), severe ([place]) [F32.2] 03/14/2017  . MDD (major depressive disorder), recurrent severe, without psychosis ([place]) [F33.2] 08/02/2014  . Alcohol use disorder, severe, dependence ([place]) [F10.20] 08/02/2014  . Alcohol withdrawal ([place]) [F10.239] 08/02/2014  . 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 Cocaine use disorder, moderate, dependence ([place]) [F14.20] 08/02/2014  . Substance induced mood disorder [facility]) [F19.94] 07/30/2014   History of Present Illness: Pt presented to the [facility] as a walk in  For help with polysubstance abuse and suicidal thoughts with a plan to lie on the train tracks and let a train kill him. Pt has history of major depressive disorder and polysubstance abuse. Pt stated he started drinking alcohol at age 6 because of peer pressure an dnever really stopped. He admits to using cocaine daily since age 13 and the amount varies from $36 to $400 depending on how much money he can get. 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 He stated he lost his job because he lost his ride to work. He stated he has never had a drivers license. Pt state he has been hospital ized at [facility] three times in the past with the last admission being in January 2019. Pt states he is compliant with his medications but when he feels like they are not working he increases his drug and alcohol use. Pt's  UDS positive for amphetamines, benzos, and cocaine on admission and BAL was negative. Pt states he has great family  support and has a Theme park manager who supports him as well. He says he has a court date scheduled 10/23/17 for resisting arrest. Pt has a younger brother who died from a drug overdose 4 years ago and he was recently put out of his mother's house because of his own drug use and has been living in hotels for the past several days. Pt has attended rehab 8-9 times in the past and knows about NA and AA but feels his best option for recovery is going to be a long term program, longer than 28 days, so he can gain the skills and sobriety he needs in order to stay drug and alcohol free.   Associated Signs/Symptoms: Depression Symptoms:  depressed mood, feelings of worthlessness/guilt, hopelessness, loss of energy/fatigue, disturbed sleep, (Hypo) Manic Symptoms:  N/A Anxiety Symptoms:  Excessive Worry, Psychotic Symptoms:  N/A PTSD Symptoms: N/A Total Time spent with patient: 45 minutes  Past Psychiatric History: MDD, Polysubstance abuse  Is the patient at risk to self? Yes.    Has the patient been a risk to self in the past 6 months? Yes.    Has the patient been a risk to self within the distant past? Yes.    Is the patient a risk to others? No.  Has the patient been a risk to others in the past 6 months? No.  Has the patient been a risk to others within the distant past? No.   Prior Inpatient Therapy:  Yes Prior Outpatient Therapy:  Yes  Alcohol Screening: 1. How often do you have a drink containing alcohol?: 4 or more times a week 2. How  many drinks containing alcohol do you have on a typical day when you are drinking?: 10 or more 3. How often do you have six or more drinks on one occasion?: Daily or almost daily AUDIT-C Score: 12 4. How often during the last year have you found that you were not able to stop drinking once you had started?: Weekly 5. How often during the last year have you failed to do what was normally expected from you becasue of drinking?: Weekly 6. How often during the last year  have you needed a first drink in the morning to get yourself going after a heavy drinking session?: Less than monthly 7. How often during the last year have you had a feeling of guilt of remorse after drinking?: Weekly 8. How often during the last year have you been unable to remember what happened the night before because you had been drinking?: Monthly 9. Have you or someone else been injured as a result of your drinking?: Yes, but not in the last year 10. Has a relative or friend or a doctor or another health worker been concerned about your drinking or suggested you cut down?: Yes, during the last year Alcohol Use Disorder Identification Test Final Score (AUDIT): 30 Intervention/Follow-up: Alcohol Education Substance Abuse History in the last 12 months:  Yes.   Consequences of Substance Abuse: Legal Consequences:  Court dtae on 8/21 for resisting arrest Family Consequences:  Unable to live at home due to drug and alcohol use Withdrawal Symptoms:   Headaches Previous Psychotropic Medications: Yes  Psychological Evaluations: Yes  Past Medical History:  Past Medical History:  Diagnosis Date  . Alcohol abuse   . Anxiety   . Depression   . Hyperlipemia   . Panic attacks   . Polysubstance abuse (HCC)    crack, oxycodone, heroin, etoh   History reviewed. No pertinent surgical history. Family History:  Family History  Problem Relation Age of Onset  . Diabetes Other   . Hypertension Other   . Cancer Other   . Heart failure Other   . Alcohol abuse Father   . Depression Brother   . Bipolar disorder Brother    Family Psychiatric  History: Father- Anxiety Brother - Bipolar, Depression, and drug abuse resulting in overdose death  Tobacco Screening: Have you used any form of tobacco in the last 30 days? (Cigarettes, Smokeless Tobacco, Cigars, and/or Pipes): Yes Tobacco use, Select all that apply: 5 or more cigarettes per day Are you interested in Tobacco Cessation Medications?: Yes,  will notify MD for an order Counseled patient on smoking cessation including recognizing danger situations, developing coping skills and basic information about quitting provided: Yes Social History:  Social History   Substance and Sexual Activity  Alcohol Use Yes   Comment: drinks daily- 4-5 40 oz of beer     Social History   Substance and Sexual Activity  Drug Use Yes  . Types: Benzodiazepines, IV, Cocaine   Comment: heroin    Additional Social History: High School graduate      Allergies:  No Known Allergies Lab Results:  Results for orders placed or performed during the hospital encounter of 10/13/17 (from the past 48 hour(s))  Comprehensive metabolic panel     Status: None   Collection Time: 10/13/17  9:20 PM  Result Value Ref Range   Sodium 139 135 - 145 mmol/L   Potassium 3.9 3.5 - 5.1 mmol/L   Chloride 107 98 - 111 mmol/L   CO2 23 22 -  32 mmol/L   Glucose, Bld 92 70 - 99 mg/dL   BUN 13 6 - 20 mg/dL   Creatinine, Ser 0.79 0.61 - 1.24 mg/dL   Calcium 9.1 8.9 - 10.3 mg/dL   Total Protein 7.7 6.5 - 8.1 g/dL   Albumin 4.0 3.5 - 5.0 g/dL   AST 24 15 - 41 U/L   ALT 25 0 - 44 U/L   Alkaline Phosphatase 71 38 - 126 U/L   Total Bilirubin 0.3 0.3 - 1.2 mg/dL   GFR calc non Af Amer >60 >60 mL/min   GFR calc Af Amer >60 >60 mL/min    Comment: (NOTE) The eGFR has been calculated using the CKD EPI equation. This calculation has not been validated in all clinical situations. eGFR's persistently <60 mL/min signify possible Chronic Kidney Disease.    Anion gap 9 5 - 15    Comment: Performed at [facility], [place] [address], [place], [place] [address]  Ethanol     Status: None   Collection Time: 10/13/17  9:20 PM  Result Value Ref Range   Alcohol, Ethyl (B) <10 <10 mg/dL    Comment: (NOTE) Lowest detectable limit for serum alcohol is 10 mg/dL. For medical purposes only. Performed at [facility], [place] [address], [place], [place]  [address]   Salicylate level     Status: None   Collection Time: 10/13/17  9:20 PM  Result Value Ref Range   Salicylate Lvl <4.8 2.8 - 30.0 mg/dL    Comment: Performed at [facility], [place] [address] [place], [place] [address]  Acetaminophen level     Status: Abnormal   Collection Time: 10/13/17  9:20 PM  Result Value Ref Range   Acetaminophen (Tylenol), Serum <10 (L) 10 - 30 ug/mL    Comment: (NOTE) Therapeutic concentrations vary significantly. A range of 10-30 ug/mL  may be an effective concentration for many patients. However, some  are best treated at concentrations outside of this range. Acetaminophen concentrations >150 ug/mL at 4 hours after ingestion  and >50 ug/mL at 12 hours after ingestion are often associated with  toxic reactions. Performed at [facility], [place] [address], [place], [place] [address]   cbc     Status: None   Collection Time: 10/13/17  9:20 PM  Result Value Ref Range   WBC 8.4 4.0 - 10.5 K/uL   RBC 5.06 4.22 - 5.81 MIL/uL   Hemoglobin 16.1 13.0 - 17.0 g/dL   HCT 45.2 39.0 - 52.0 %   MCV 89.3 78.0 - 100.0 fL   MCH 31.8 26.0 - 34.0 pg   MCHC 35.6 30.0 - 36.0 g/dL   RDW 13.1 11.5 - 15.5 %   Platelets 256 150 - 400 K/uL    Comment: Performed at [facility], [place] [address], [place], [place] [address]  Rapid urine drug screen (hospital performed)     Status: Abnormal   Collection Time: 10/13/17  9:20 PM  Result Value Ref Range   Opiates NONE DETECTED NONE DETECTED   Cocaine POSITIVE (A) NONE DETECTED   Benzodiazepines POSITIVE (A) NONE DETECTED   Amphetamines POSITIVE (A) NONE DETECTED   Tetrahydrocannabinol NONE DETECTED NONE DETECTED   Barbiturates NONE DETECTED NONE DETECTED    Comment: (NOTE) DRUG SCREEN FOR MEDICAL PURPOSES ONLY.  IF CONFIRMATION IS NEEDED FOR ANY PURPOSE, NOTIFY LAB WITHIN 5 DAYS. LOWEST DETECTABLE LIMITS FOR URINE DRUG SCREEN Drug Class  Cutoff  (ng/mL) Amphetamine and metabolites    1000 Barbiturate and metabolites    200 Benzodiazepine                 580 Tricyclics and metabolites     300 Opiates and metabolites        300 Cocaine and metabolites        300 THC                            50 Performed at [facility], [place] [address], [place], [place] [address]     Blood Alcohol level:  Lab Results  Component Value Date   ETH <10 10/13/2017   ETH 52 (H) 82/50/5397    Metabolic Disorder Labs:  No results found for: HGBA1C, MPG No results found for: PROLACTIN No results found for: CHOL, TRIG, HDL, CHOLHDL, VLDL, LDLCALC  Current Medications: Current Facility-Administered Medications  Medication Dose Route Frequency Provider Last Rate Last Dose  . acetaminophen (TYLENOL) tablet 650 mg  650 mg Oral Q6H PRN Patriciaann Clan E, PA-C      . albuterol (PROVENTIL HFA;VENTOLIN HFA) 108 (90 Base) MCG/ACT inhaler 1-2 puff  1-2 puff Inhalation Q6H PRN Laverle Hobby, PA-C      . alum & mag hydroxide-simeth (MAALOX/MYLANTA) 200-200-20 MG/5ML suspension 30 mL  30 mL Oral Q4H PRN Laverle Hobby, PA-C      . ARIPiprazole (ABILIFY) tablet 2 mg  2 mg Oral Daily Patriciaann Clan E, PA-C   2 mg at 10/14/17 0840  . chlordiazePOXIDE (LIBRIUM) capsule 25 mg  25 mg Oral Q6H PRN Laverle Hobby, PA-C      . cloNIDine (CATAPRES) tablet 0.1 mg  0.1 mg Oral QID Patriciaann Clan E, PA-C   0.1 mg at 10/14/17 1214   Followed by  . [START ON 10/16/2017] cloNIDine (CATAPRES) tablet 0.1 mg  0.1 mg Oral BH-qamhs [name], [name], PA-C       Followed by  . [START ON 10/18/2017] cloNIDine (CATAPRES) tablet 0.1 mg  0.1 mg Oral QAC breakfast Laverle Hobby, PA-C      . dicyclomine (BENTYL) tablet 20 mg  20 mg Oral Q6H PRN Laverle Hobby, PA-C      . DULoxetine (CYMBALTA) DR capsule 60 mg  60 mg Oral Daily Laverle Hobby, PA-C   60 mg at 10/14/17 6734  . hydrOXYzine (ATARAX/VISTARIL) tablet 25 mg  25 mg Oral Q6H PRN Laverle Hobby,  PA-C      . loperamide (IMODIUM) capsule 2-4 mg  2-4 mg Oral PRN Laverle Hobby, PA-C      . loratadine (CLARITIN) tablet 10 mg  10 mg Oral Daily Patriciaann Clan E, PA-C   10 mg at 10/14/17 0839  . magnesium hydroxide (MILK OF MAGNESIA) suspension 30 mL  30 mL Oral Daily PRN Laverle Hobby, PA-C      . methocarbamol (ROBAXIN) tablet 500 mg  500 mg Oral Q8H PRN Laverle Hobby, PA-C      . naproxen (NAPROSYN) tablet 500 mg  500 mg Oral BID PRN Patriciaann Clan E, PA-C   500 mg at 10/14/17 1214  . nicotine (NICODERM CQ - dosed in mg/24 hours) patch 21 mg  21 mg Transdermal Daily Patriciaann Clan E, PA-C   21 mg at 10/14/17 0841  . ondansetron (ZOFRAN-ODT) disintegrating tablet 4 mg  4 mg Oral Q6H PRN Laverle Hobby, PA-C      .  pneumococcal 23 valent vaccine (PNU-IMMUNE) injection 0.5 mL  0.5 mL Intramuscular Tomorrow-1000 Cobos, Fernando A, MD      . traZODone (DESYREL) tablet 200 mg  200 mg Oral QHS Patriciaann Clan E, PA-C       PTA Medications: Medications Prior to Admission  Medication Sig Dispense Refill Last Dose  . albuterol (PROVENTIL HFA;VENTOLIN HFA) 108 (90 Base) MCG/ACT inhaler Inhale 1-2 puffs into the lungs every 6 (six) hours as needed for wheezing or shortness of breath.   10/13/2017 at Unknown time  . ARIPiprazole (ABILIFY) 2 MG tablet Take 1 tablet (2 mg total) by mouth daily. For mood control/adjunct to antidepressant 30 tablet 0 10/13/2017 at Unknown time  . clonazePAM (KLONOPIN PO) Take 2 mg by mouth once.   10/13/2017 at Unknown time  . DULoxetine (CYMBALTA) 60 MG capsule Take 1 capsule (60 mg total) by mouth daily. For depression 30 capsule 0 10/13/2017 at Unknown time  . hydrOXYzine (ATARAX/VISTARIL) 25 MG tablet Take 1 tablet (25 mg total) by mouth every 6 (six) hours as needed for anxiety. 60 tablet 0 10/12/2017 at Unknown time  . ibuprofen (ADVIL,MOTRIN) 200 MG tablet Take 400 mg by mouth daily.   10/13/2017 at Unknown time  . loratadine (CLARITIN) 10 MG tablet Take 10 mg by  mouth daily.   10/13/2017 at Unknown time  . nicotine (NICODERM CQ - DOSED IN MG/24 HOURS) 21 mg/24hr patch Place 1 patch (21 mg total) onto the skin daily. (May purchase from over the counter): For smoking cessation (Patient not taking: Reported on 10/13/2017) 28 patch 0 Not Taking at Unknown time  . traZODone (DESYREL) 100 MG tablet Take 1 tablet (100 mg total) by mouth at bedtime. For sleep (Patient taking differently: Take 200 mg by mouth at bedtime. For sleep) 30 tablet 0 10/12/2017 at Unknown time    Musculoskeletal: Strength & Muscle Tone: within normal limits Gait & Station: normal Patient leans: N/A  Psychiatric Specialty Exam: Physical Exam  Constitutional: He is oriented to person, place, and time. He appears well-developed and well-nourished.  HENT:  Head: Normocephalic.  Respiratory: Effort normal.  Musculoskeletal: Normal range of motion.  Neurological: He is alert and oriented to person, place, and time.  Psychiatric: His speech is normal and behavior is normal. Thought content normal. Cognition and memory are normal. He expresses impulsivity. He exhibits a depressed mood.    Review of Systems  Psychiatric/Behavioral: Positive for depression and substance abuse. Negative for hallucinations, memory loss and suicidal ideas. The patient is not nervous/anxious and does not have insomnia.     Blood pressure 121/74, pulse 94, temperature 98.4 F (36.9 C), temperature source Oral, resp. rate 16, height 6' (1.829 m), weight 131.5 kg.Body mass index is 39.33 kg/m.  General Appearance: Casual  Eye Contact:  Good  Speech:  Clear and Coherent and Normal Rate  Volume:  Normal  Mood:  Depressed  Affect:  Congruent and Depressed  Thought Process:  Coherent, Goal Directed and Linear  Orientation:  Full (Time, Place, and Person)  Thought Content:  Logical  Suicidal Thoughts:  No  Homicidal Thoughts:  No  Memory:  Immediate;   Good Recent;   Good Remote;   Fair  Judgement:  Fair   Insight:  Fair  Psychomotor Activity:  Normal  Concentration:  Concentration: Good and Attention Span: Good  Recall:  Good  Fund of Knowledge:  Good  Language:  Good  Akathisia:  No  Handed:  Right  AIMS (if indicated):  Assets:  Communication Skills Desire for Improvement  ADL's:  Intact  Cognition:  WNL  Sleep:  Number of Hours: 5.5    Treatment Plan Summary: Daily contact with patient to assess and evaluate symptoms and progress in treatment and Medication management  Observation Level/Precautions:  15 minute checks  Laboratory:  None  Psychotherapy:    Medications:    Consultations:    Discharge Concerns:    Estimated LOS:  Other:     Physician Treatment Plan for Primary Diagnosis: MDD (major depressive disorder), severe ([place])   Long Term Goal(s): Improvement in symptoms so as ready for discharge  Ability to disclose suicidal ideas Abilty to maintain self control Compliance with prescribed medication regimen Ability to identify and act upon effective coping mechanisms Ability to identify triggers that will lead to drug and alcohol use  Short Term Goals: Ability to identify changes in lifestyle to reduce recurrence of condition will improve, Ability to verbalize feelings will improve, Ability to disclose and discuss suicidal ideas, Ability to demonstrate self-control will improve, Ability to identify and develop effective coping behaviors will improve, Ability to maintain clinical measurements within normal limits will improve, Compliance with prescribed medications will improve and Ability to identify triggers associated with substance abuse/mental health issues will improve  Physician Treatment Plan for Secondary Diagnosis: Principal Problem:   MDD (major depressive disorder), severe ([place]) Polysubstance Abuse  Long Term Goal(s): Improvement in symptoms so as ready for discharge  Abiltiy to maintain self control Compliance with prescribed medication  regimen Ability to identify and act upon effective coping mechanisms Ability to identify triggers that will lead to drug and alcohol use  Short Term Goals: Ability to identify changes in lifestyle to reduce recurrence of condition will improve, Ability to verbalize feelings will improve, Ability to disclose and discuss suicidal ideas, Ability to demonstrate self-control will improve, Ability to identify and develop effective coping behaviors will improve and Ability to identify triggers associated with substance abuse/mental health issues will improve  I certify that inpatient services furnished can reasonably be expected to improve the patient's condition.    Ethelene Hal, NP 8/12/20191:27 PM   I have discussed case with NP and have met with patient  Agree with NP note and assessment  27, single, no children , lives alone, currently unemployed .  Presented to hospital voluntarily reporting worsening depression and suicidal thoughts, with thoughts of getting hit by a train. He reports history of alcohol dependence and has been drinking daily , regularly , up to 5  40 ounce beers. He also reports daily crack cocaine use . He reports occasional , irregular heroin abuse, last time 1-2 weeks ago. He reports history of depression and states he has been compliant with psychiatric medications Admission BAL was negative, admission UDS was positive for Amphetamines, BZDS, Cocaine- reports he last drank alcohol on day before admission.   He has a history of prior psychiatric admissions, most recently in January 2019 here at [facility] for similar presentation ( depression, SI, alcohol abuse)  At the time was diagnosed with Substance Induced Mood Disorder and was discharged on Abilify, Cymbalta, Trazodone.  Denies history of suicide attempts or of psychosis. Describes history of polysubstance use disorder, and identifies alcohol and cocaine as substances of choice. Denies history of withdrawal seizures  or DTs. He does endorse intermittent heroin (IV ) abuse, last time 2 weeks ago.   Medical history - Hep C (+)   Dx- Alcohol Use Disorder, Cocaine Use Disorder, Substance Induced  Mood Disorder, Depressed   Plan- inpatient admission. Will discontinue Clonidine as reports only occasional and irregular opiate abuse . Will start Librium detox protocol ( PRN) for alcohol /BZD WDL symptoms if needed . Will continue Cymbalta 60 mgrs QDAY, increase Abilify to 5 mgrs QDAY  Patient reports recent IVDA, requests to be tested for HIV and Hep B

## 2017-10-14 NOTE — H&P (Signed)
Behavioral Health Medical Screening Exam  Jeffery JaegerMark Schmelzer is an 27 y.o. male.[facility], with reported polysubstance abuse with commensurate MDD and SI with plan. He endorses having HCV, otherwise no other chronic and or acute medical concerns. He has been compliant with his previously prescribed psychotropics. There are NKDA.  Total Time spent with patient: 20 minutes  Psychiatric Specialty Exam: Physical Exam  Constitutional: He is oriented to person, place, and time. He appears well-developed and well-nourished. No distress.  HENT:  Head: Normocephalic.  Eyes: Pupils are equal, round, and reactive to light.  Respiratory: Effort normal and breath sounds normal. No respiratory distress.  GI: Soft.  Neurological: He is alert and oriented to person, place, and time. No cranial nerve deficit.  Skin: Skin is warm and dry. He is not diaphoretic.  Psychiatric: His affect is labile. His speech is delayed. He is slowed and withdrawn. Cognition and memory are impaired. He expresses inappropriate judgment. He exhibits a depressed mood. He expresses suicidal ideation. He expresses suicidal plans.    Review of Systems  Constitutional: Negative for chills, diaphoresis, fever, malaise/fatigue and weight loss.  Psychiatric/Behavioral: Positive for depression, substance abuse and suicidal ideas. The patient is nervous/anxious.   All other systems reviewed and are negative.   Blood pressure 131/83, pulse 82, temperature 98.7 F (37.1 C), temperature source Oral, resp. rate 16, height 6' (1.829 m), weight 131.5 kg.Body mass index is 39.33 kg/m.  General Appearance: Casual  Eye Contact:  Good  Speech:  Clear and Coherent  Volume:  Normal  Mood:  Depressed  Affect:  Congruent  Thought Process:  Goal Directed  Orientation:  Full (Time, Place, and Person)  Thought Content:  Logical  Suicidal Thoughts:  Yes.  with intent/plan  Homicidal Thoughts:  No  Memory:  Immediate;   Fair   Judgement:  Impaired  Insight:  Lacking  Psychomotor Activity:  Normal  Concentration: Concentration: Fair  Recall:  [employer]  Fund of Knowledge:Fair  Language: Fair  Akathisia:  Negative  Handed:  Right  AIMS (if indicated):     Assets:  Desire for Improvement  Sleep:       Musculoskeletal: Strength & Muscle Tone: within normal limits Gait & Station: normal Patient leans: Right  Blood pressure 131/83, pulse 82, temperature 98.7 F (37.1 C), temperature source Oral, resp. rate 16, height 6' (1.829 m), weight 131.5 kg.  Recommendations:  Based on my evaluation the patient appears to have an emergency medical condition for which I recommend the patient be transferred to the emergency department for further evaluation.  Kerry HoughSpencer E Yona Stansbury, PA-C 10/14/2017, 12:08 AM

## 2017-10-14 NOTE — [facility] Suicide Risk Assessment (Addendum)
[facility]   Nursing information obtained from:  Patient, Review of record Demographic factors:  Male, Caucasian, Living alone, Unemployed Current Mental Status:  Suicidal ideation indicated by patient, Suicide plan, Plan includes specific time, place, or method, Self-harm thoughts, Intention to act on suicide plan Loss Factors:  Legal issues, Decrease in vocational status Historical Factors:  Family history of mental illness or substance abuse Risk Reduction Factors:  Sense of responsibility to family, Positive social support, Positive therapeutic relationship  Total Time spent with patient: 45 minutes Principal Problem: Alcohol, Cocaine Use Disorder, substance induced mood disorder  Diagnosis:   Patient Active Problem List   Diagnosis Date Noted  . MDD (major depressive disorder), severe (HCC) [F32.2] 03/14/2017  . MDD (major depressive disorder), recurrent severe, without psychosis (HCC) [F33.2] 08/02/2014  . Alcohol use disorder, severe, dependence (HCC) [F10.20] 08/02/2014  . Alcohol withdrawal (HCC) [F10.239] 08/02/2014  . 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 Cocaine use disorder, moderate, dependence (HCC) [F14.20] 08/02/2014  . Substance induced mood disorder [facility](HCC) [F19.94] 07/30/2014   Subjective Data:   Continued Clinical Symptoms:  Alcohol Use Disorder Identification Test Final Score (AUDIT): 30 The "Alcohol Use Disorders Identification Test", Guidelines for Use in Primary Care, Second Edition.  World Science writerHealth Organization [facility](WHO). Score between 0-7:  no or low risk or alcohol related problems. Score between 8-15:  moderate risk of alcohol related problems. Score between 16-19:  high risk of alcohol related problems. Score 20 or above:  warrants further diagnostic evaluation for alcohol dependence and treatment.   CLINICAL FACTORS:  27, single, no  children , lives alone, currently unemployed .  Presented to hospital voluntarily reporting worsening depression and suicidal thoughts, with thoughts of getting hit by a train. He reports history of alcohol dependence and has been drinking daily , regularly , up to 5  40 ounce beers. He also reports daily crack cocaine use . He reports occasional , irregular heroin abuse, last time 1-2 weeks ago. He reports history of depression and states he has been compliant with psychiatric medications Admission BAL was negative, admission UDS was positive for Amphetamines, BZDS, Cocaine- reports he last drank alcohol on day before admission.   He has a history of prior psychiatric admissions, [facility] ( depression, SI, alcohol abuse)  At the time was diagnosed with Substance Induced Mood Disorder and was discharged on Abilify, Cymbalta, Trazodone.  Denies history of suicide attempts or of psychosis. Describes history of polysubstance use disorder, and identifies alcohol and cocaine as substances of choice. Denies history of withdrawal seizures or DTs. He does endorse intermittent heroin (IV ) abuse, last time 2 weeks ago.   Medical history - Hep C (+)   Dx- Alcohol Use Disorder, Cocaine Use Disorder, Substance Induced Mood Disorder, Depressed   Plan- inpatient admission. Will discontinue Clonidine as reports only occasional and irregular opiate abuse . Will start Librium detox protocol ( PRN) for alcohol /BZD WDL symptoms if needed . Will continue Cymbalta 60 mgrs QDAY, increase Abilify to 5 mgrs QDAY  Patient reports recent IVDA, requests to be tested for HIV and Hep B     Musculoskeletal: Strength & Muscle Tone: within normal limits- mild distal tremors, no diaphoresis, no psychomotor restlessness  Gait & Station: normal Patient leans: N/A  Psychiatric Specialty Exam: Physical Exam  ROS no headache, no visual disturbances, no chest  pain, no  shortness of breath, no vomiting   Blood pressure 121/74, pulse 94, temperature 98.4 F (36.9 C), temperature source Oral, resp. rate 16, height 6' (1.829 m), weight 131.5 kg.Body mass index is 39.33 kg/m.  General Appearance: Fairly Groomed  Eye Contact:  Fair  Speech:  Normal Rate  Volume:  Normal  Mood:  depressed   Affect:  vaguely constricted, smiles appropriately at times   Thought Process:  Linear and Descriptions of Associations: Intact  Orientation:  Other:  fully alert and attentive, oriented x 3   Thought Content:  denies hallucinations, no delusions, not internally preoccupied   Suicidal Thoughts:  No denies suicidal ideations , denies self injurious ideations, contracts for safety , denies homicidal or violent ideations  Homicidal Thoughts:  No  Memory:  recent and remote grossly intact   Judgement:  Other:  fair  Insight:  fair   Psychomotor Activity:  Normal- minimal distal tremors, no diaphoresis  Concentration:  Concentration: Good and Attention Span: Good  Recall:  Good  Fund of Knowledge:  Good  Language:  Good  Akathisia:  Negative  Handed:  Right  AIMS (if indicated):     Assets:  Desire for Improvement Resilience  ADL's:  Intact  Cognition:  WNL  Sleep:  Number of Hours: 5.5      COGNITIVE FEATURES THAT CONTRIBUTE TO RISK:  Closed-mindedness and Loss of executive function    SUICIDE RISK:   Moderate:  Frequent suicidal ideation with limited intensity, and duration, some specificity in terms of plans, no associated intent, good self-control, limited dysphoria/symptomatology, some risk factors present, and identifiable protective factors, including available and accessible social support.  PLAN OF CARE: Patient will be admitted to inpatient psychiatric unit for stabilization and safety. Will provide and encourage milieu participation. Provide medication management and maked adjustments as needed. Will also provide medication management to address potential  WDL symptoms.  Will follow daily.    I certify that inpatient services furnished can reasonably be expected to improve the patient's condition.   Craige CottaFernando A Tuff Clabo, MD 10/14/2017, 3:18 PM

## 2017-10-15 LAB — HIV ANTIBODY (ROUTINE TESTING W REFLEX): HIV SCREEN 4TH GENERATION: NONREACTIVE

## 2017-10-15 LAB — LIPID PANEL
Cholesterol: 199 mg/dL (ref 0–200)
HDL: 52 mg/dL (ref >40)
LDL Cholesterol: 72 mg/dL (ref 0–99)
Total CHOL/HDL Ratio: 3.8 RATIO
Triglycerides: 376 mg/dL — ABNORMAL HIGH (ref <150)
VLDL: 75 mg/dL — ABNORMAL HIGH (ref 0–40)

## 2017-10-15 LAB — TSH: TSH: 1.387 u[IU]/mL (ref 0.350–4.500)

## 2017-10-15 NOTE — Progress Notes (Signed)
[facility]  10/15/2017 12:28 PM Jeffery JaegerMark Vacca  MRN:  [id number] Subjective:  Per H&P: [facility]  requesting help with polysubstance abuse and suicidal thoughts with a plan to lie on the train tracks and let a train kill him. Pt has history of major depressive disorder and polysubstance abuse. Pt stated he started drinking alcohol at age 27 because of peer pressure an dnever really stopped. He admits to using cocaine daily since age 27 and the amount varies from 74$20 to $400 depending on how much money he can get. Pt stated he graduated from high school and until last week worked in [employer]. He stated he lost his job because he lost his ride to work. He stated he has never had a drivers license. Pt state [facility] in [facility] past with the last admission being in January 2019. Pt states he is compliant with his medications but when he feels like they are not working he increases his drug and alcohol use. Pt's  UDS positive for amphetamines, benzos, and cocaine on admission and BAL was negative. Pt states he has great family support and has a Education officer, environmentalpastor who supports him as well. He says he has a court date scheduled 10/23/17 for resisting arrest. Pt has a younger brother who died from a drug overdose 4 years ago and he was recently put out of his mother's house because of his own drug use and has been living in hotels for the past several days. Pt has attended rehab 8-9 times in the past and knows about NA and AA but feels his best option for recovery is going to be a long term program, longer than 28 days, so he can gain the skills and sobriety he needs in order to stay drug and alcohol free.   On evaluation today: Today pt is seen in the day room playing solitaire and interacting appropriately with his peers. Pt chose not to go outside due to still having some shakiness from withdrawals from cocaine and alcohol. Pt has no other  withdrawal complaints. Pt denies any suicidal/homicidal ideations or plan, denies auditory and visual hallucinations. Pt stated he came in to the hospital to detox with the hopes of going to a long term treatment program. He also stated he has a new job starting the end of this month. Pt works in [employer]. Pt denies trauma during childhood and stated his parents were together when he was a child and his childhood was normal. His parents divorced when he was 16 years old which coincides with his first use of cocaine.  Pt appears calm and is cooperative. Pt stated he has always taken his medications as prescribed but they were not working so he started self medicating with alcohol, cocaine and recently started using methamphetamines.   Principal Problem: MDD (major depressive disorder), severe (HCC) Diagnosis:   Patient Active Problem List   Diagnosis Date Noted  . MDD (major depressive disorder), severe (HCC) [F32.2] 03/14/2017  . MDD (major depressive disorder), recurrent severe, without psychosis (HCC) [F33.2] 08/02/2014  . Alcohol use disorder, severe, dependence (HCC) [F10.20] 08/02/2014  . Alcohol withdrawal (HCC) [F10.239] 08/02/2014  . 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 Cocaine use disorder, moderate, dependence (HCC) [F14.20] 08/02/2014  . Substance induced mood disorder (HCC) [F19.94] 07/30/2014   Total Time spent with patient: 30  minutes  Past Psychiatric History: As above  Past Medical History:  Past Medical History:  Diagnosis Date  . Alcohol abuse   . Anxiety   . Depression   . Hyperlipemia   . Panic attacks   . Polysubstance abuse (HCC)    crack, oxycodone, heroin, etoh   History reviewed. No pertinent surgical history. Family History:  Family History  Problem Relation Age of Onset  . Diabetes Other   . Hypertension Other   . Cancer Other   . Heart failure Other    . Alcohol abuse Father   . Depression Brother   . Bipolar disorder Brother    Family Psychiatric  History: Per admission H&P Social History:  Social History   Substance and Sexual Activity  Alcohol Use Yes   Comment: drinks daily- 4-5 40 oz of beer     Social History   Substance and Sexual Activity  Drug Use Yes  . Types: Benzodiazepines, IV, Cocaine   Comment: heroin    Social History   Socioeconomic History  . Marital status: Single    Spouse name: Not on file  . Number of children: Not on file  . Years of education: Not on file  . Highest education level: Not on file  Occupational History  . Not on file  Social Needs  . Financial resource strain: Not on file  . Food insecurity:    Worry: Not on file    Inability: Not on file  . Transportation needs:    Medical: Not on file    Non-medical: Not on file  Tobacco Use  . Smoking status: Current Some Day Smoker    Packs/day: 1.00    Types: Cigarettes  . Smokeless tobacco: Never Used  Substance and Sexual Activity  . Alcohol use: Yes    Comment: drinks daily- 4-5 40 oz of beer  . Drug use: Yes    Types: Benzodiazepines, IV, Cocaine    Comment: heroin  . Sexual activity: Not on file  Lifestyle  . Physical activity:    Days per week: Not on file    Minutes per session: Not on file  . Stress: Not on file  Relationships  . Social connections:    Talks on phone: Not on file    Gets together: Not on file    Attends religious service: Not on file    Active member of club or organization: Not on file    Attends meetings of clubs or organizations: Not on file    Relationship status: Not on file  Other Topics Concern  . Not on file  Social History Narrative  . Not on file   Additional Social History:     Sleep: Fair  Appetite:  Good  Current Medications: Current Facility-Administered Medications  Medication Dose Route Frequency Provider Last Rate Last Dose  . acetaminophen (TYLENOL) tablet 650 mg  650  mg Oral Q6H PRN Kerry Hough, PA-C   650 mg at 10/14/17 2114  . albuterol (PROVENTIL HFA;VENTOLIN HFA) 108 (90 Base) MCG/ACT inhaler 1-2 puff  1-2 puff Inhalation Q6H PRN Kerry Hough, PA-C      . alum & mag hydroxide-simeth (MAALOX/MYLANTA) 200-200-20 MG/5ML suspension 30 mL  30 mL Oral Q4H PRN Donell Sievert E, PA-C      . ARIPiprazole (ABILIFY) tablet 5 mg  5 mg Oral Daily Cobos, Rockey Situ, MD   5 mg at 10/15/17 0803  . chlordiazePOXIDE (LIBRIUM) capsule 25 mg  25 mg Oral Q6H PRN  Cobos, Rockey Situ, MD   25 mg at 10/15/17 1610  . DULoxetine (CYMBALTA) DR capsule 60 mg  60 mg Oral Daily Kerry Hough, PA-C   60 mg at 10/15/17 9604  . hydrOXYzine (ATARAX/VISTARIL) tablet 25 mg  25 mg Oral Q6H PRN Cobos, Rockey Situ, MD      . loratadine (CLARITIN) tablet 10 mg  10 mg Oral Daily Kerry Hough, PA-C   10 mg at 10/15/17 0803  . magnesium hydroxide (MILK OF MAGNESIA) suspension 30 mL  30 mL Oral Daily PRN Kerry Hough, PA-C      . multivitamin with minerals tablet 1 tablet  1 tablet Oral Daily Cobos, Rockey Situ, MD   1 tablet at 10/15/17 0803  . nicotine (NICODERM CQ - dosed in mg/24 hours) patch 21 mg  21 mg Transdermal Daily Donell Sievert E, PA-C   21 mg at 10/15/17 0805  . ondansetron (ZOFRAN-ODT) disintegrating tablet 4 mg  4 mg Oral Q6H PRN Cobos, Rockey Situ, MD      . pneumococcal 23 valent vaccine (PNU-IMMUNE) injection 0.5 mL  0.5 mL Intramuscular Tomorrow-1000 Cobos, Fernando A, MD      . thiamine (VITAMIN B-1) tablet 100 mg  100 mg Oral Daily Cobos, Rockey Situ, MD   100 mg at 10/15/17 0803  . traZODone (DESYREL) tablet 100 mg  100 mg Oral QHS PRN Cobos, Rockey Situ, MD   100 mg at 10/14/17 2114    Lab Results:  Results for orders placed or performed during the hospital encounter of 10/13/17 (from the past 48 hour(s))  TSH     Status: None   Collection Time: 10/15/17  6:18 AM  Result Value Ref Range   TSH 1.387 0.350 - 4.500 uIU/mL    Comment: Performed by a 3rd  Generation assay with a functional sensitivity of <=0.01 uIU/mL. Performed at [facility], [address], [place], [place] [address]   Lipid panel     Status: Abnormal   Collection Time: 10/15/17  6:18 AM  Result Value Ref Range   Cholesterol 199 0 - 200 mg/dL   Triglycerides 119 (H) <150 mg/dL   HDL 52 >14 mg/dL   Total CHOL/HDL Ratio 3.8 RATIO   VLDL 75 (H) 0 - 40 mg/dL   LDL Cholesterol 72 0 - 99 mg/dL    Comment:        Total Cholesterol/HDL:CHD Risk Coronary Heart Disease Risk Table                     Men   Women  1/2 Average Risk   3.4   3.3  Average Risk       5.0   4.4  2 X Average Risk   9.6   7.1  3 X Average Risk  23.4   11.0        Use the calculated Patient Ratio above and the CHD Risk Table to determine the patient's CHD Risk.        ATP III CLASSIFICATION (LDL):  <100     mg/dL   Optimal  782-956  mg/dL   Near or Above                    Optimal  130-159  mg/dL   Borderline  213-086  mg/dL   High  >578     mg/dL   Very High Performed at [facility], [address], [place], [place] [address]  Blood Alcohol level:  Lab Results  Component Value Date   ETH <10 10/13/2017   ETH 52 (H) 03/14/2017    Metabolic Disorder Labs: No results found for: HGBA1C, MPG No results found for: PROLACTIN Lab Results  Component Value Date   CHOL 199 10/15/2017   TRIG 376 (H) 10/15/2017   HDL 52 10/15/2017   CHOLHDL 3.8 10/15/2017   VLDL 75 (H) 10/15/2017   LDLCALC 72 10/15/2017    Physical Findings: AIMS: Facial and Oral Movements Muscles of Facial Expression: None, normal Lips and Perioral Area: None, normal Jaw: None, normal Tongue: None, normal,Extremity Movements Upper (arms, wrists, hands, fingers): None, normal Lower (legs, knees, ankles, toes): None, normal, Trunk Movements Neck, shoulders, hips: None, normal, Overall Severity Severity of abnormal movements (highest score from questions above): None,  normal Incapacitation due to abnormal movements: None, normal Patient's awareness of abnormal movements (rate only patient's report): No Awareness, Dental Status Current problems with teeth and/or dentures?: No Does patient usually wear dentures?: No  CIWA:  CIWA-Ar Total: 3 COWS:  COWS Total Score: 2  Musculoskeletal: Strength & Muscle Tone: within normal limits Gait & Station: normal Patient leans: N/A  Psychiatric Specialty Exam: Physical Exam  ROS  Blood pressure 124/80, pulse 84, temperature 98.8 F (37.1 C), temperature source Oral, resp. rate 16, height 6' (1.829 m), weight 131.5 kg.Body mass index is 39.33 kg/m.       General Appearance: Casual  Eye Contact:  Good  Speech:  Clear and Coherent and Normal Rate  Volume:  Normal  Mood:  Depressed  Affect:  Congruent and Depressed  Thought Process:  Coherent, Goal Directed and Linear  Orientation:  Full (Time, Place, and Person)  Thought Content:  Logical  Suicidal Thoughts:  No  Homicidal Thoughts:  No  Memory:  Immediate;   Good Recent;   Good Remote;   Fair  Judgement:  Fair  Insight:  Fair  Psychomotor Activity:  Normal  Concentration:  Concentration: Good and Attention Span: Good  Recall:  Good  Fund of Knowledge:  Good  Language:  Good  Akathisia:  No  Handed:  Right  AIMS (if indicated):     Assets:  Communication Skills Desire for Improvement  ADL's:  Intact  Cognition:  WNL  Sleep:  Number of Hours: 5.5      Treatment Plan Summary: Daily contact with patient to assess and evaluate symptoms and progress in treatment and Medication management  -Crisis Stabilization Medications: -Continue Abilify 5 mg daily for mood stabilization - Continue Librium 25 mg every 6 hours PRN for withdrawal symptoms -Continue Cymbalta 60 mg daily for depression - Continue Vistaril 25 mg every 6 hours as needed for anxiety -Continue Nicoderm-CQ 21 mg for nicotine cessation   Laveda AbbeLaurie Britton Parks, NP 10/15/2017,  12:28 PM

## 2017-10-15 NOTE — Progress Notes (Signed)
Patient ID: Jeffery JaegerMark Hancock, male   DOB: 03/08/1990, 27 y.o.   MRN: [id number]  D: Patient pleasant on approach tonight. Reports that his mood is about the same with depression but denies any active SI at present. Contracts on the unit. Attended group tonight. A: Staff will monitor on q 15 minute checks, follow treatment plan, and give medications as ordered. R: Cooperative on the unit. Tylenol given for back pain and trazodone for sleep.

## 2017-10-15 NOTE — [facility] Group Notes (Signed)
Adult Psychoeducational Group Note  Date:  10/15/2017  Time: 3:00 PM  Group Topic/Focus: Communication  Participation Level:  Active  Participation Quality:  Inattentive  Affect:  Silly, Sarcastic  Cognitive:  Alert and Oriented  Insight: Limited  Engagement in Group:  Developing/Improving  Modes of Intervention:  Discussion  Additional Comments:  Patient was having side conversations in group and was disruptive at times. Patient did contribute to the overall discussion.  Jeffery Hancock A Issak Goley 10/15/2017 4:00 PM

## 2017-10-15 NOTE — Plan of Care (Signed)
  Problem: Education: Goal: Emotional status will improve Outcome: Progressing   Problem: Education: Goal: Mental status will improve Outcome: Progressing   Problem: Education: Goal: Verbalization of understanding the information provided will improve Outcome: Progressing   Problem: Activity: Goal: Interest or engagement in activities will improve Outcome: Progressing   Patient was pleasant upon approach this morning. Denied SI HI AVH. Patient stated he slept well. Patient voiced no complaints except for anxiety, which is constantly 7/10, as well as tremors due to withdrawal.  Patient is compliant with medications prescribed per provider. Safety is maintained with 15 minute checks as well as environmental checks. Will continue to monitor.

## 2017-10-16 LAB — HEMOGLOBIN A1C
Hgb A1c MFr Bld: 5 % (ref 4.8–5.6)
Mean Plasma Glucose: 97 mg/dL

## 2017-10-16 LAB — HEPATITIS B SURFACE ANTIGEN: HEP B S AG: NEGATIVE

## 2017-10-16 MED ORDER — HYDROXYZINE HCL 50 MG PO TABS
50.0000 mg | ORAL_TABLET | Freq: Four times a day (QID) | ORAL | Status: AC | PRN
  Administered 2017-10-16 – 2017-10-17 (×2): 50 mg via ORAL
  Filled 2017-10-16 (×2): qty 1

## 2017-10-16 NOTE — Progress Notes (Signed)
[facility] Group Notes: (Nursing/MHT/Case Management/Adjunct)  Date:10/16/2017 Time:4:00 PM  Type of Therapy:Psychoeducational Skills  Participation Level:Active  Participation Quality:Appropriate  Affect:Appropriate  Cognitive:Appropriate  Insight:Good and Improving  Engagement in Group:Improving  Modes of Intervention:Discussion and Support  Summary of Progress/Problems:  Anda Sobotta Nakya Weyand 10/16/2017,5:10 PM 

## 2017-10-16 NOTE — Progress Notes (Signed)
Pt was standing next to male peer when male peer was getting HS medications.  Male peer dropped Clonidine tab on the floor and then picked it up and put it in her bra.  This was reported to pt's RN, who discussed the danger of taking other people's medications with pt.  Later in shift, writer heard pt discussing what happened with peers in the dayroom and stating "they're really going to be on us now!"

## 2017-10-16 NOTE — Progress Notes (Signed)
Pt reports his day has been "ok", but he is still having moderate withdrawal symptoms.  He says he continues to feel anxious and shaky inside.  He says the prns he is taking are helping.  He voices no other needs or concerns.  He denies SI/HI/AVH.  He has been appropriate and cooperative this evening.  He has been in the dayroom most of the evening talking with peers and watching TV.  Support and encouragement offered.  Discharge plans are in process.  Safety maintained with q15 minute checks.

## 2017-10-16 NOTE — Therapy (Signed)
Occupational Therapy Group Note  Date:  10/16/2017 Time:  12:55 PM  Group Topic/Focus:  Stress Management  Participation Level:  Minimal  Participation Quality:  Intrusive and Resistant  Affect:  Flat  Cognitive:  Appropriate  Insight: Limited  Engagement in Group:  Distracting and Off Topic  Modes of Intervention:  Activity, Discussion, Education and Socialization  Additional Comments:    S: "I honestly know all of these already"  O: Stress management group completed to use as productive coping strategy, to help mitigate maladaptive coping to integrate in functional BADL/IADL. Education given on the definition of stress and its cognitive, behavioral, emotional, and physical effects on the body. Stress symptom checklist completed. Stress management tool worksheet discussed to educate on unhealthy vs healthy coping skills to manage stress to improve community integration. Gratitude journal administered at end of session.  A: Pt presents to group with flat affect, often having side conversations and innappropriately joking and laughing throughout session despite redirection. Stress management tools worksheet completed. When asked to identify one area to implement, pt states "I know all of these, I could recite them all but don't do them". Despite further education, pt would not identify area of improvement.  P: Pt provided with education on stress management activities to implement into daily routine. Handouts given to facilitate carryover when reintegrating into community   [facility] HospitalKaylee Hancock Gentle, [place] YorkMSOT, OTR/L  AvnetKaylee Aijalon Hancock 10/16/2017, 12:55 PM

## 2017-10-16 NOTE — Progress Notes (Signed)
D: Patient continues to have withdrawal symptoms; he reports tremors, cravings and agitation.  Patient is visible in the milieu this am; he is visibly tremulous.  His BP is stable; he complains of some lower back pain.  He denies any thoughts of self harm.  He reports his depression as an 8; hopelessness and anxiety as a 9.  His sleep and appetite are fair; his energy level is low and his concentration is poor.    A: Continue to monitor medication management and MD orders.  Safety checks completed every 15 minutes per protocol.  Offer support and encouragement as needed.  R: Patient is receptive to staff; his behavior is appropriate.

## 2017-10-16 NOTE — Progress Notes (Signed)
[facility]  10/16/2017 1:28 PM Jeffery JaegerMark Barsanti  MRN:  [id number] Subjective: Patient is seen and examined.  Patient is a 27 year old male with a past psychiatric history significant for polysubstance use disorders and dependence as well as suicidal ideation.  He also has a history of major depression.  He was admitted on 10/14/2017.  He stated he is doing a little bit better.  He stated he still remains anxious.  He did not realize that his Cymbalta dosage was increased to 60 mg yesterday.  He is also asking for an increase in his Vistaril as well as the trazodone.  He denied any suicidal ideation.  His previous drug screen on admission was significant for amphetamines, benzodiazepines, cocaine but his blood alcohol was negative.  He does have a court date on 10/23/2017.  His blood pressure has been improving, but today his heart rate went up and blood pressure went up a bit.  He continues on Librium as needed for a CIWA> 10.  Otherwise he denied any other problems except for anxiety and mild tremor. Principal Problem: MDD (major depressive disorder), severe (HCC) Diagnosis:   Patient Active Problem List   Diagnosis Date Noted  . MDD (major depressive disorder), severe (HCC) [F32.2] 03/14/2017  . MDD (major depressive disorder), recurrent severe, without psychosis (HCC) [F33.2] 08/02/2014  . Alcohol use disorder, severe, dependence (HCC) [F10.20] 08/02/2014  . Alcohol withdrawal (HCC) [F10.239] 08/02/2014  . 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 Cocaine use disorder, moderate, dependence (HCC) [F14.20] 08/02/2014  . Substance induced mood disorder (HCC) [F19.94] 07/30/2014   Total Time spent with patient: 15 minutes  Past Psychiatric History: See admission H&P  Past Medical History:  Past Medical History:  Diagnosis Date  . Alcohol abuse   . Anxiety   . Depression   . Hyperlipemia    . Panic attacks   . Polysubstance abuse (HCC)    crack, oxycodone, heroin, etoh   History reviewed. No pertinent surgical history. Family History:  Family History  Problem Relation Age of Onset  . Diabetes Other   . Hypertension Other   . Cancer Other   . Heart failure Other   . Alcohol abuse Father   . Depression Brother   . Bipolar disorder Brother    Family Psychiatric  History: See admission H&P Social History:  Social History   Substance and Sexual Activity  Alcohol Use Yes   Comment: drinks daily- 4-5 40 oz of beer     Social History   Substance and Sexual Activity  Drug Use Yes  . Types: Benzodiazepines, IV, Cocaine   Comment: heroin    Social History   Socioeconomic History  . Marital status: Single    Spouse name: Not on file  . Number of children: Not on file  . Years of education: Not on file  . Highest education level: Not on file  Occupational History  . Not on file  Social Needs  . Financial resource strain: Not on file  . Food insecurity:    Worry: Not on file    Inability: Not on file  . Transportation needs:    Medical: Not on file    Non-medical: Not on file  Tobacco Use  . Smoking status: Current Some Day Smoker    Packs/day: 1.00    Types: Cigarettes  . Smokeless tobacco: Never Used  Substance and Sexual Activity  . Alcohol use: Yes  Comment: drinks daily- 4-5 40 oz of beer  . Drug use: Yes    Types: Benzodiazepines, IV, Cocaine    Comment: heroin  . Sexual activity: Not on file  Lifestyle  . Physical activity:    Days per week: Not on file    Minutes per session: Not on file  . Stress: Not on file  Relationships  . Social connections:    Talks on phone: Not on file    Gets together: Not on file    Attends religious service: Not on file    Active member of club or organization: Not on file    Attends meetings of clubs or organizations: Not on file    Relationship status: Not on file  Other Topics Concern  . Not on file   Social History Narrative  . Not on file   Additional Social History:                         Sleep: Fair  Appetite:  Good  Current Medications: Current Facility-Administered Medications  Medication Dose Route Frequency Provider Last Rate Last Dose  . acetaminophen (TYLENOL) tablet 650 mg  650 mg Oral Q6H PRN Kerry HoughSimon, [name], PA-C   650 mg at 10/15/17 2128  . albuterol (PROVENTIL HFA;VENTOLIN HFA) 108 (90 Base) MCG/ACT inhaler 1-2 puff  1-2 puff Inhalation Q6H PRN Kerry HoughSimon, [name], PA-C      . alum & mag hydroxide-simeth (MAALOX/MYLANTA) 200-200-20 MG/5ML suspension 30 mL  30 mL Oral Q4H PRN Donell SievertSimon, [name], PA-C      . ARIPiprazole (ABILIFY) tablet 5 mg  5 mg Oral Daily Cobos, Rockey SituFernando A, MD   5 mg at 10/16/17 0806  . chlordiazePOXIDE (LIBRIUM) capsule 25 mg  25 mg Oral Q6H PRN Cobos, Rockey SituFernando A, MD   25 mg at 10/16/17 0808  . DULoxetine (CYMBALTA) DR capsule 60 mg  60 mg Oral Daily Kerry HoughSimon, [name], PA-C   60 mg at 10/16/17 [id number]  . hydrOXYzine (ATARAX/VISTARIL) tablet 50 mg  50 mg Oral Q6H PRN Antonieta Pertlary, Greg Lawson, MD      . loratadine (CLARITIN) tablet 10 mg  10 mg Oral Daily Kerry HoughSimon, [name], PA-C   10 mg at 10/16/17 0806  . magnesium hydroxide (MILK OF MAGNESIA) suspension 30 mL  30 mL Oral Daily PRN Kerry HoughSimon, [name], PA-C      . multivitamin with minerals tablet 1 tablet  1 tablet Oral Daily Cobos, Rockey SituFernando A, MD   1 tablet at 10/16/17 0806  . nicotine (NICODERM CQ - dosed in mg/24 hours) patch 21 mg  21 mg Transdermal Daily Donell SievertSimon, [name], PA-C   21 mg at 10/16/17 [id number]  . ondansetron (ZOFRAN-ODT) disintegrating tablet 4 mg  4 mg Oral Q6H PRN Cobos, Fernando A, MD      . thiamine (VITAMIN B-1) tablet 100 mg  100 mg Oral Daily Cobos, Rockey SituFernando A, MD   100 mg at 10/16/17 0806  . traZODone (DESYREL) tablet 100 mg  100 mg Oral QHS PRN Cobos, Rockey SituFernando A, MD   100 mg at 10/15/17 2128    Lab Results:  Results for orders placed or performed during the hospital encounter of  10/13/17 (from the past 48 hour(s))  TSH     Status: None   Collection Time: 10/15/17  6:18 AM  Result Value Ref Range   TSH 1.387 0.350 - 4.500 uIU/mL    Comment: Performed by a 3rd Generation assay with a  functional sensitivity of <=0.01 uIU/mL. Performed at [facility], [address], [place], [place] [address]   Hemoglobin A1c     Status: None   Collection Time: 10/15/17  6:18 AM  Result Value Ref Range   Hgb A1c MFr Bld 5.0 4.8 - 5.6 %    Comment: (NOTE)         Prediabetes: 5.7 - 6.4         Diabetes: >6.4         Glycemic control for adults with diabetes: <7.0    Mean Plasma Glucose 97 mg/dL    Comment: (NOTE) Performed At: [facility] [address] [place], [place] [id number] Jolene Schimke MD [id number]   Lipid panel     Status: Abnormal   Collection Time: 10/15/17  6:18 AM  Result Value Ref Range   Cholesterol 199 0 - 200 mg/dL   Triglycerides 034 (H) <150 mg/dL   HDL 52 >74 mg/dL   Total CHOL/HDL Ratio 3.8 RATIO   VLDL 75 (H) 0 - 40 mg/dL   LDL Cholesterol 72 0 - 99 mg/dL    Comment:        Total Cholesterol/HDL:CHD Risk Coronary Heart Disease Risk Table                     Men   Women  1/2 Average Risk   3.4   3.3  Average Risk       5.0   4.4  2 X Average Risk   9.6   7.1  3 X Average Risk  23.4   11.0        Use the calculated Patient Ratio above and the CHD Risk Table to determine the patient's CHD Risk.        ATP III CLASSIFICATION (LDL):  <100     mg/dL   Optimal  259-563  mg/dL   Near or Above                    Optimal  130-159  mg/dL   Borderline  875-643  mg/dL   High  >329     mg/dL   Very High Performed at [facility], [address], [place], [place] [address]   HIV antibody     Status: None   Collection Time: 10/15/17  6:18 AM  Result Value Ref Range   HIV Screen 4th Generation wRfx Non Reactive Non Reactive    Comment: (NOTE) Performed At: [facility] [address]  [place], [place] [id number] Jolene Schimke MD [id number]   Hepatitis B surface antigen     Status: None   Collection Time: 10/15/17  6:18 AM  Result Value Ref Range   Hepatitis B Surface Ag Negative Negative    Comment: (NOTE) Performed At: [facility] [address] [place], [place] [id number] Jolene Schimke MD [id number]     Blood Alcohol level:  Lab Results  Component Value Date   ETH <10 10/13/2017   ETH 52 (H) 03/14/2017    Metabolic Disorder Labs: Lab Results  Component Value Date   HGBA1C 5.0 10/15/2017   MPG 97 10/15/2017   No results found for: PROLACTIN Lab Results  Component Value Date   CHOL 199 10/15/2017   TRIG 376 (H) 10/15/2017   HDL 52 10/15/2017   CHOLHDL 3.8 10/15/2017   VLDL 75 (H) 10/15/2017   LDLCALC 72 10/15/2017    Physical Findings: AIMS: Facial and Oral Movements  Muscles of Facial Expression: None, normal Lips and Perioral Area: None, normal Jaw: None, normal Tongue: None, normal,Extremity Movements Upper (arms, wrists, hands, fingers): None, normal Lower (legs, knees, ankles, toes): None, normal, Trunk Movements Neck, shoulders, hips: None, normal, Overall Severity Severity of abnormal movements (highest score from questions above): None, normal Incapacitation due to abnormal movements: None, normal Patient's awareness of abnormal movements (rate only patient's report): No Awareness, Dental Status Current problems with teeth and/or dentures?: No Does patient usually wear dentures?: No  CIWA:  CIWA-Ar Total: 2 COWS:  COWS Total Score: 5  Musculoskeletal: Strength & Muscle Tone: within normal limits Gait & Station: normal Patient leans: N/A  Psychiatric Specialty Exam: Physical Exam  Nursing note and vitals reviewed. Constitutional: He is oriented to person, place, and time. He appears well-developed and well-nourished.  HENT:  Head: Normocephalic and atraumatic.  Respiratory: Effort normal.  Neurological: He is  alert and oriented to person, place, and time.    ROS  Blood pressure (!) 134/99, pulse (!) 102, temperature 98.5 F (36.9 C), temperature source Oral, resp. rate 18, height 6' (1.829 m), weight 131.5 kg.Body mass index is 39.33 kg/m.  General Appearance: Casual  Eye Contact:  Fair  Speech:  Normal Rate  Volume:  Increased  Mood:  Anxious  Affect:  Congruent  Thought Process:  Coherent  Orientation:  Full (Time, Place, and Person)  Thought Content:  Logical  Suicidal Thoughts:  No  Homicidal Thoughts:  No  Memory:  Immediate;   Fair Recent;   Fair Remote;   Fair  Judgement:  Intact  Insight:  Fair  Psychomotor Activity:  Increased  Concentration:  Concentration: Fair and Attention Span: Fair  Recall:  [employer] of Knowledge:  Fair  Language:  Fair  Akathisia:  Negative  Handed:  Right  AIMS (if indicated):     Assets:  Communication Skills Desire for Improvement Physical Health Resilience  ADL's:  Intact  Cognition:  WNL  Sleep:  Number of Hours: 6.75     Treatment Plan Summary: Daily contact with patient to assess and evaluate symptoms and progress in treatment, Medication management and Plan : Patient is seen and examined.  Patient is a 27 year old male with the above-stated past psychiatric history seen in follow-up.  He is slowly improving.  I will increase his hydroxyzine to 50 mg p.o. every 6 hours as needed anxiety.  He continues on Librium 25 mg p.o. every 6 hours as needed CIWA> 10.  He also continues on Abilify 5 mg p.o. daily, Cymbalta 60 mg p.o. daily and trazodone 100 mg p.o. daily.  Social work to the patient are working on disposition with regard to outpatient substance abuse treatment.  He should be ready to go and 1 to 2 days.   Antonieta Pert, MD 10/16/2017, 1:28 PM

## 2017-10-17 ENCOUNTER — Encounter (HOSPITAL_COMMUNITY): Payer: Self-pay | Admitting: Behavioral Health

## 2017-10-17 MED ORDER — HYDROXYZINE HCL 50 MG PO TABS
50.0000 mg | ORAL_TABLET | Freq: Four times a day (QID) | ORAL | Status: DC | PRN
  Administered 2017-10-17 – 2017-10-18 (×2): 50 mg via ORAL
  Filled 2017-10-17: qty 10
  Filled 2017-10-17 (×2): qty 1

## 2017-10-17 NOTE — [facility] Group Notes (Signed)
LCSW Group Therapy Note   10/17/2017 1:15pm   Type of Therapy and Topic:  Group Therapy:  Overcoming Obstacles   Participation Level:  Active   Description of Group:    In this group patients will be encouraged to explore what they see as obstacles to their own wellness and recovery. They will be guided to discuss their thoughts, feelings, and behaviors related to these obstacles. The group will process together ways to cope with barriers, with attention given to specific choices patients can make. Each patient will be challenged to identify changes they are motivated to make in order to overcome their obstacles. This group will be process-oriented, with patients participating in exploration of their own experiences as well as giving and receiving support and challenge from other group members.   Therapeutic Goals: 1. Patient will identify personal and current obstacles as they relate to admission. 2. Patient will identify barriers that currently interfere with their wellness or overcoming obstacles.  3. Patient will identify feelings, thought process and behaviors related to these barriers. 4. Patient will identify two changes they are willing to make to overcome these obstacles:      Summary of Patient Progress Jeffery Hancock was attentive and engaged during today's processing group. He shared that his biggest obstacle involves staying sober and staying on medication. [name] plans to return home with his mother and [facility] [place]. "I have court next week so [facility] I plan to go back to work after court anyway." He is open to AA and other peer support. Jeffery Hancock continues to show progress in the group setting with improving insight.    Therapeutic Modalities:   Cognitive Behavioral Therapy Solution Focused Therapy Motivational Interviewing Relapse Prevention Therapy  Rona RavensHeather S Gad Aymond, LCSW 10/17/2017 1:03 PM

## 2017-10-17 NOTE — [facility] Group Notes (Signed)
Adult Psychoeducational Group Note  Date:  10/17/2017 Time:  10:48 PM  Group Topic/Focus:  Wrap-Up Group:   The focus of this group is to help patients review their daily goal of treatment and discuss progress on daily workbooks.  Participation Level:  Active  Participation Quality:  Appropriate and Attentive  Affect:  Appropriate  Cognitive:  Alert and Appropriate  Insight: Appropriate and Good  Engagement in Group:  Engaged  Modes of Intervention:  Discussion and Education  Additional Comments:  Pt attended and participated in wrap up group this evening. Pt had an okay day and they have "no complaints". Pt goal was to "keep themselves straight, due to them going back to work.   Jeffery NettersOctavia A Mihika Hancock 10/17/2017, 10:48 PM

## 2017-10-17 NOTE — Progress Notes (Signed)
[facility] MD Progress Note  10/17/2017 11:18 AM Doye Montilla  MRN:  [id number]  Subjective: "A little anxious about a court date I have coming up but otherwise, I am feeling good."  Evaluation on the unit: Patient seen and examined, chart reviewed and case discussed with treatment team prior to this evaluation.  Patient is a 27 year old male with a past psychiatric history significant for polysubstance use disorders and dependence as well as suicidal ideation.  He also has a history of major depression. He was admitted to the unit following suicidal thoughts with a plan to lie on the train tracks and let a train kill him  On assessment, patient is alert and oriented x4, calm and cooperative. He endorses overall improvement in mood although he continues to feel anxious regarding an upcoming court date (10/23/2017). He remains complaint with medications and denies any intolerance or medication related side effects. He denies any suicidal ideation with plan or intent, homicidal ideas or AVH. There are no signs of psychosis, paranoid behaviors, delusions or other psychotic process observed. His blood pressures remain stable. He endorses he continues to have some mild  tremors in relation to withdrawal symptom although notes tr mors have improved. He endorses no concerns with appetite or resting pattern. Denies somatic complaints or acute pain. At this time, he is contracting for safety on the unit.   Principal Problem: MDD (major depressive disorder), severe (HCC) Diagnosis:   Patient Active Problem List   Diagnosis Date Noted  . MDD (major depressive disorder), severe (HCC) [F32.2] 03/14/2017  . MDD (major depressive disorder), recurrent severe, without psychosis (HCC) [F33.2] 08/02/2014  . Alcohol use disorder, severe, dependence (HCC) [F10.20] 08/02/2014  . Alcohol withdrawal (HCC) [F10.239] 08/02/2014  . 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 Cocaine use disorder, moderate, dependence (HCC) [F14.20] 08/02/2014  . Substance induced mood disorder (HCC) [F19.94] 07/30/2014   Total Time spent with patient: 20 minutes  Past Psychiatric History: See admission H&P  Past Medical History:  Past Medical History:  Diagnosis Date  . Alcohol abuse   . Anxiety   . Depression   . Hyperlipemia   . Panic attacks   . Polysubstance abuse (HCC)    crack, oxycodone, heroin, etoh   History reviewed. No pertinent surgical history. Family History:  Family History  Problem Relation Age of Onset  . Diabetes Other   . Hypertension Other   . Cancer Other   . Heart failure Other   . Alcohol abuse Father   . Depression Brother   . Bipolar disorder Brother    Family Psychiatric  History: See admission H&P Social History:  Social History   Substance and Sexual Activity  Alcohol Use Yes   Comment: drinks daily- 4-5 40 oz of beer     Social History   Substance and Sexual Activity  Drug Use Yes  . Types: Benzodiazepines, IV, Cocaine   Comment: heroin    Social History   Socioeconomic History  . Marital status: Single    Spouse name: Not on file  . Number of children: Not on file  . Years of education: Not on file  . Highest education level: Not on file  Occupational History  . Not on file  Social Needs  . Financial resource strain: Not on file  . Food insecurity:    Worry: Not on file    Inability: Not on file  . Transportation needs:  Medical: Not on file    Non-medical: Not on file  Tobacco Use  . Smoking status: Current Some Day Smoker    Packs/day: 1.00    Types: Cigarettes  . Smokeless tobacco: Never Used  Substance and Sexual Activity  . Alcohol use: Yes    Comment: drinks daily- 4-5 40 oz of beer  . Drug use: Yes    Types: Benzodiazepines, IV, Cocaine    Comment: heroin  . Sexual activity: Not on file  Lifestyle  . Physical activity:    Days per week:  Not on file    Minutes per session: Not on file  . Stress: Not on file  Relationships  . Social connections:    Talks on phone: Not on file    Gets together: Not on file    Attends religious service: Not on file    Active member of club or organization: Not on file    Attends meetings of clubs or organizations: Not on file    Relationship status: Not on file  Other Topics Concern  . Not on file  Social History Narrative  . Not on file   Additional Social History:                         Sleep: Fair  Appetite:  Good  Current Medications: Current Facility-Administered Medications  Medication Dose Route Frequency Provider Last Rate Last Dose  . acetaminophen (TYLENOL) tablet 650 mg  650 mg Oral Q6H PRN Kerry HoughSimon, [name], PA-C   650 mg at 10/17/17 [id number]  . albuterol (PROVENTIL HFA;VENTOLIN HFA) 108 (90 Base) MCG/ACT inhaler 1-2 puff  1-2 puff Inhalation Q6H PRN Kerry HoughSimon, [name], PA-C      . alum & mag hydroxide-simeth (MAALOX/MYLANTA) 200-200-20 MG/5ML suspension 30 mL  30 mL Oral Q4H PRN Donell SievertSimon, [name], PA-C      . ARIPiprazole (ABILIFY) tablet 5 mg  5 mg Oral Daily Cobos, Rockey SituFernando A, MD   5 mg at 10/17/17 0747  . chlordiazePOXIDE (LIBRIUM) capsule 25 mg  25 mg Oral Q6H PRN Cobos, Rockey SituFernando A, MD   25 mg at 10/16/17 1710  . DULoxetine (CYMBALTA) DR capsule 60 mg  60 mg Oral Daily Donell SievertSimon, [name], PA-C   60 mg at 10/17/17 0747  . hydrOXYzine (ATARAX/VISTARIL) tablet 50 mg  50 mg Oral Q6H PRN Antonieta Pertlary, Greg Lawson, MD   50 mg at 10/17/17 0759  . loratadine (CLARITIN) tablet 10 mg  10 mg Oral Daily Kerry HoughSimon, [name], PA-C   10 mg at 10/17/17 0747  . magnesium hydroxide (MILK OF MAGNESIA) suspension 30 mL  30 mL Oral Daily PRN Kerry HoughSimon, [name], PA-C      . multivitamin with minerals tablet 1 tablet  1 tablet Oral Daily Cobos, Rockey SituFernando A, MD   1 tablet at 10/17/17 0747  . nicotine (NICODERM CQ - dosed in mg/24 hours) patch 21 mg  21 mg Transdermal Daily Donell SievertSimon, [name], PA-C   21  mg at 10/17/17 0759  . ondansetron (ZOFRAN-ODT) disintegrating tablet 4 mg  4 mg Oral Q6H PRN Cobos, Fernando A, MD      . thiamine (VITAMIN B-1) tablet 100 mg  100 mg Oral Daily Cobos, Rockey SituFernando A, MD   100 mg at 10/17/17 0747  . traZODone (DESYREL) tablet 100 mg  100 mg Oral QHS PRN Cobos, Rockey SituFernando A, MD   100 mg at 10/16/17 2130    Lab Results:  No results found for  this or any previous visit (from the past 48 hour(s)).  Blood Alcohol level:  Lab Results  Component Value Date   ETH <10 10/13/2017   ETH 52 (H) 03/14/2017    Metabolic Disorder Labs: Lab Results  Component Value Date   HGBA1C 5.0 10/15/2017   MPG 97 10/15/2017   No results found for: PROLACTIN Lab Results  Component Value Date   CHOL 199 10/15/2017   TRIG 376 (H) 10/15/2017   HDL 52 10/15/2017   CHOLHDL 3.8 10/15/2017   VLDL 75 (H) 10/15/2017   LDLCALC 72 10/15/2017    Physical Findings: AIMS: Facial and Oral Movements Muscles of Facial Expression: None, normal Lips and Perioral Area: None, normal Jaw: None, normal Tongue: None, normal,Extremity Movements Upper (arms, wrists, hands, fingers): None, normal Lower (legs, knees, ankles, toes): None, normal, Trunk Movements Neck, shoulders, hips: None, normal, Overall Severity Severity of abnormal movements (highest score from questions above): None, normal Incapacitation due to abnormal movements: None, normal Patient's awareness of abnormal movements (rate only patient's report): No Awareness, Dental Status Current problems with teeth and/or dentures?: No Does patient usually wear dentures?: No  CIWA:  CIWA-Ar Total: 1 COWS:  COWS Total Score: 0  Musculoskeletal: Strength & Muscle Tone: within normal limits Gait & Station: normal Patient leans: N/A  Psychiatric Specialty Exam: Physical Exam  Nursing note and vitals reviewed. Constitutional: He is oriented to person, place, and time. He appears well-developed and well-nourished.  HENT:  Head:  Normocephalic and atraumatic.  Respiratory: Effort normal.  Neurological: He is alert and oriented to person, place, and time.    Review of Systems  Psychiatric/Behavioral: Positive for depression and substance abuse. Negative for hallucinations, memory loss and suicidal ideas. The patient is nervous/anxious. The patient does not have insomnia.   All other systems reviewed and are negative.   Blood pressure 118/78, pulse (!) 117, temperature 98.3 F (36.8 C), temperature source Oral, resp. rate 18, height 6' (1.829 m), weight 131.5 kg.Body mass index is 39.33 kg/m.  General Appearance: Casual  Eye Contact:  Fair  Speech:  Normal Rate  Volume:  Increased  Mood:  Anxious; endorses improvement in depression   Affect:  Congruent  Thought Process:  Coherent  Orientation:  Full (Time, Place, and Person)  Thought Content:  Logical  Suicidal Thoughts:  No  Homicidal Thoughts:  No  Memory:  Immediate;   Fair Recent;   Fair Remote;   Fair  Judgement:  Intact  Insight:  Fair  Psychomotor Activity:  Increased  Concentration:  Concentration: Fair and Attention Span: Fair  Recall:  [employer]Fair  Fund of Knowledge:  Fair  Language:  Fair  Akathisia:  Negative  Handed:  Right  AIMS (if indicated):     Assets:  Communication Skills Desire for Improvement Physical Health Resilience  ADL's:  Intact  Cognition:  WNL  Sleep:  Number of Hours: 6.5     Treatment Plan Summary: Reviewed current treatment plan. Will continue the following plan without adjustments at this time;   Daily contact with patient to assess and evaluate symptoms and progress in treatment, Medication management and Plan : Patient is seen and examined.  Patient is a 27 year old male with the above-stated past psychiatric history seen in follow-up. Patient continues to present with improvement in psychiatric symptoms/conditions. He endorses some anxiety and tremors related to withdrawal symptoms although he does note improvement.  It was discussed in treatment team patients desire for an inpatient substance abuse program although patient was advised  that he would have to attend his court date prior to any admission. Per Child psychotherapist he will have follow-up with [facility] following discharge.    At this time, will continue hydroxyzine to 50 mg p.o. every 6 hours as needed anxiety, Librium 25 mg p.o. every 6 hours as needed CIWA> 10. Abilify 5 mg p.o. Daily for mood stabilization, Cymbalta 60 mg p.o. Daily for depression and trazodone 100 mg p.o. Daily at bedtime as needed for sleep.  Social work to continue to work on patients discharge plans. Per MD,  patient should be prepared for discharge in 1 to 2 days if he remains stable.     Denzil Magnuson, NP 10/17/2017, 11:18 AM    Patient ID: Carloyn Jaeger, male   DOB: 1990/07/15, 27 y.o.   MRN: [id number]

## 2017-10-17 NOTE — Progress Notes (Signed)
Early in the shift, pt reported that his day was "ok", but that he was still having issues with is anxiety and had been taking Librium and Vistaril prn throughout the day.  He denies SI/HI/AVH.  He has also been receiving Tylenol for back pain.  Pt has been observed in the dayroom through the evening talking with peers and socializing.  Later at med pass time, he was observed standing behind and watching a male peer as she was taking her medications.  After she took her meds, it was observed that she had dropped a pill and picked it up, putting it in her bra.  He followed her into the dayroom instead of staying in line to get his meds.  Writer left the med room and confronted the male patient who was taken to her room to be searched.  Afterwards, pt was called to the window to take his prn Trazodone.  While at the window, Clinical research associatewriter spoke to pt about the dangers of taking other people's meds, stating that one of the side affects could be lowering BP.  Pt, laughing nervously,  then said, "I've taken Clonidine;  I know what it does", but denied having any part in the earlier incident .  Writer had not mentioned that the pill in question was a Clonidine, so it seems that this pt knew what medication was being hidden.  Pt was also observed later standing behind another pt watching.  This was after he had received his HS meds.  Discharge plans are in process.  Safety maintained with q15 minute checks.

## 2017-10-17 NOTE — Progress Notes (Addendum)
D: Patient continues to complain of ongoing anxiety.  He reports withdrawal symptoms as tremors, cravings and agitation.  He does not appear to show any specific signs of withdrawal today.  He complains of some back pain which he is given tylenol.  His affect is brighter today; he is visible in the milieu.  He denies any thoughts of self harm.  CIWA d/c'd because of low scores.  He rates his depression as a 7; his hopelessness and anxiety as a 9.  He reports poor sleep and good appetite; his energy level is normal and his concentration is poor.  His mood appears stable; he does not appear to be in any physical distress.  A: Continue to monitor medication management and MD orders.  Safety checks continued every 15 minutes per protocol.  Offer support and encouragement as needed.  R: Patient is receptive to staff; his behavior is appropriate.

## 2017-10-18 MED ORDER — ARIPIPRAZOLE 5 MG PO TABS: 5.0000 mg | ORAL_TABLET | Freq: Every day | ORAL | 0 refills | Status: AC

## 2017-10-18 MED ORDER — HYDROXYZINE HCL 50 MG PO TABS: 50.0000 mg | ORAL_TABLET | Freq: Four times a day (QID) | ORAL | 0 refills | Status: AC | PRN

## 2017-10-18 MED ORDER — DULOXETINE HCL 60 MG PO CPEP: 60.0000 mg | ORAL_CAPSULE | Freq: Every day | ORAL | 0 refills | Status: AC

## 2017-10-18 MED ORDER — TRAZODONE HCL 100 MG PO TABS: 100.0000 mg | ORAL_TABLET | Freq: Every evening | ORAL | 0 refills | Status: AC | PRN

## 2017-10-18 NOTE — Progress Notes (Signed)
Discharge note:  Patient discharged per MD order.  He received all personal belongings from unit and room.  Patient became upset because he called his ride before his paperwork was ready and the ride had to leave.  Patient started yelling and pacing.  His paperwork was initiated [facility]  Patient apologized profusely as he was leaving.  He denies any thoughts of self harm.  He did not want to wait for his medications.  Patient left ambulatory with a bus ticket.  Reviewed AVS/transition record with patient and he indicated understanding.

## 2017-10-18 NOTE — Discharge Summary (Signed)
Physician Discharge Summary Note  Patient:  Jeffery Hancock is an 27 y.o., male  MRN:  [id number]  DOB:  06/24/1990  Patient phone:  [phone] (home)   Patient address:   [address] [place] [address],   Total Time spent with patient: Greater than 30 minutes  Date of Admission:  10/13/2017  Date of Discharge: 10/18/2017  Reason for Admission: Pt presented to the [facility] as a walk in  For help with polysubstance abuse and suicidal thoughts with a plan to lie on the train tracks and let a train kill him. Pt has history of major depressive disorder and polysubstance abuse. Pt stated he started drinking alcohol at age 27 because of peer pressure an dnever really stopped. He admits to using cocaine daily since age 27 and the amount varies from 77$20 to $400 depending on how much money he can get. Pt stated he graduated from high school and until last week worked in [employer]. He stated he lost his job because he lost his ride to work. He stated he has never had a drivers license. Pt state he has been hospital ized at [facility] three times in the past with the last admission being in January 2019. Pt states he is compliant with his medications but when he feels like they are not working he increases his drug and alcohol use. Pt's  UDS positive for amphetamines, benzos, and cocaine on admission and BAL was negative. Pt states he has great family support and has a Education officer, environmentalpastor who supports him as well. He says he has a court date scheduled 10/23/17 for resisting arrest. Pt has a younger brother who died from a drug overdose 4 years ago and he was recently put out of his mother's house because of his own drug use and has been living in hotels for the past several days. Pt has attended rehab 8-9 times in the past and knows about NA and AA but feels his best option for recovery is going to be a long term program, longer than 28 days, so he can gain the skills and sobriety he needs in order to stay drug and  alcohol free.   Associated Signs/Symptoms: Depression Symptoms:  depressed mood, feelings of worthlessness/guilt, hopelessness, loss of energy/fatigue, disturbed sleep, (Hypo) Manic Symptoms:  N/A Anxiety Symptoms:  Excessive Worry, Psychotic Symptoms:  N/A PTSD Symptoms: N/A   Past Psychiatric History: MDD, Polysubstance abuse  Principal Problem: MDD (major depressive disorder), severe (HCC) Discharge Diagnoses: Patient Active Problem List   Diagnosis Date Noted  . MDD (major depressive disorder), severe (HCC) [F32.2] 03/14/2017  . MDD (major depressive disorder), recurrent severe, without psychosis (HCC) [F33.2] 08/02/2014  . Alcohol use disorder, severe, dependence (HCC) [F10.20] 08/02/2014  . Alcohol withdrawal (HCC) [F10.239] 08/02/2014  . 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 Cocaine use disorder, moderate, dependence (HCC) [F14.20] 08/02/2014  . Substance induced mood disorder [facility](HCC) [F19.94] 07/30/2014   Musculoskeletal: Strength & Muscle Tone: within normal limits Gait & Station: normal Patient leans: N/A  Psychiatric Specialty Exam: Physical Exam  Constitutional: He is oriented to person, place, and time. He appears well-developed.  HENT:  Head: Normocephalic.  Eyes: Pupils are equal, round, and reactive to light.  Neck: Normal range of motion.  Cardiovascular: Normal rate.  Respiratory: Effort normal.  GI: Soft.  Genitourinary:  Genitourinary Comments: Deferred  Musculoskeletal: Normal range of motion.  Neurological: He is alert and oriented to  person, place, and time.  Skin: Skin is warm.  Psychiatric: His speech is normal and behavior is normal. Judgment and thought content normal. His mood appears not anxious. His affect is not angry, not blunt, not labile and not inappropriate. Cognition and memory are normal. He does not exhibit a depressed mood.     Review of Systems  Constitutional: Negative.   HENT: Negative.   Eyes: Negative.   Respiratory: Negative.   Cardiovascular: Negative.   Gastrointestinal: Negative.   Genitourinary: Negative.   Musculoskeletal: Negative.   Skin: Negative.   Neurological: Negative.   Endo/Heme/Allergies: Negative.   Psychiatric/Behavioral: Positive for depression (Stable) and substance abuse (Alcohol & cocaine use disorder). Negative for hallucinations, memory loss and suicidal ideas. The patient has insomnia (Stable). The patient is not nervous/anxious.     Blood pressure 135/85, pulse (!) 128, temperature 98.5 F (36.9 C), temperature source Oral, resp. rate 20, height 6' (1.829 m), weight 131.5 kg.Body mass index is 39.33 kg/m.  See Md's SRA  Have you used any form of tobacco in the last 30 days? (Cigarettes, Smokeless Tobacco, Cigars, and/or Pipes): Yes   Has this patient used any form of tobacco in the last 30 days? (Cigarettes, Smokeless Tobacco, Cigars, and/or Pipes): Yes, we reviewed benefits of smoking cessation, and patient wants nicotine patches prescribed at discharge.   Past Medical History:  Past Medical History:  Diagnosis Date  . Alcohol abuse   . Anxiety   . Depression   . Hyperlipemia   . Panic attacks   . Polysubstance abuse (HCC)    crack, oxycodone, heroin, etoh   History reviewed. No pertinent surgical history.  Family History:  Family History  Problem Relation Age of Onset  . Diabetes Other   . Hypertension Other   . Cancer Other   . Heart failure Other   . Alcohol abuse Father   . Depression Brother   . Bipolar disorder Brother    Social History:  Social History   Substance and Sexual Activity  Alcohol Use Yes   Comment: drinks daily- 4-5 40 oz of beer     Social History   Substance and Sexual Activity  Drug Use Yes  . Types: Benzodiazepines, IV, Cocaine   Comment: heroin    Social History   Socioeconomic History  . Marital status: Single     Spouse name: Not on file  . Number of children: Not on file  . Years of education: Not on file  . Highest education level: Not on file  Occupational History  . Not on file  Social Needs  . Financial resource strain: Not on file  . Food insecurity:    Worry: Not on file    Inability: Not on file  . Transportation needs:    Medical: Not on file    Non-medical: Not on file  Tobacco Use  . Smoking status: Current Some Day Smoker    Packs/day: 1.00    Types: Cigarettes  . Smokeless tobacco: Never Used  Substance and Sexual Activity  . Alcohol use: Yes    Comment: drinks daily- 4-5 40 oz of beer  . Drug use: Yes    Types: Benzodiazepines, IV, Cocaine    Comment: heroin  . Sexual activity: Not on file  Lifestyle  . Physical activity:    Days per week: Not on file    Minutes per session: Not on file  . Stress: Not on file  Relationships  . Social connections:    Talks  on phone: Not on file    Gets together: Not on file    Attends religious service: Not on file    Active member of club or organization: Not on file    Attends meetings of clubs or organizations: Not on file    Relationship status: Not on file  Other Topics Concern  . Not on file  Social History Narrative  . Not on file    Hospital Course: On his admission assessment, Jeffery Hancock stressed the need to get back on his mental health medications. He stated that he had in the past done well on Cymbalta. He was also enrolled in the group counseling sessions, AA/NA meetings being offered and held on this unit. He participated and learned coping skills. Jeffery Hancock presented with no other significant medical issues that required treatments. He tolerated his treatment regimen without any adverse effects reported.  During the course of this hospitalization,  Jeffery Hancock  was medicated & discharged on;  Abilify 5mg  for mood control/adjunct to antidepressant, Duloxetine 60 mg for depression & Hydroxyzine 25 mg prn for anxiety, Nicotine patch 21 mg  for smoking cessation & Trazodone 100 mg for insomnia. Jeffery Hancock's symptoms responded well to his treatment. This is evidenced by his reports of improved mood, absence substance withdrawal symptoms & suicidal ideations. He will continue further substance abuse treatment as noted below. He is provided with all the necessary information needed to make this appointment without problems.  Upon discharge, Jeffery Hancock adamantly denies any suicidal, homicidal ideations, auditory, visual hallucinations, delusional thoughts, paranoia and or withdrawal symptoms. He left [facility] with all personal belongings in no apparent distress. He received a 7 days worth supply samples of his [facility] discharge medications provided by [facility] pharmacy.   Discharge Vitals:   Blood pressure 135/85, pulse (!) 128, temperature 98.5 F (36.9 C), temperature source Oral, resp. rate 20, height 6' (1.829 m), weight 131.5 kg. Body mass index is 39.33 kg/m.  Lab Results:   No results found for this or any previous visit (from the past 72 hour(s)).  Physical Findings: AIMS: Facial and Oral Movements Muscles of Facial Expression: None, normal Lips and Perioral Area: None, normal Jaw: None, normal Tongue: None, normal,Extremity Movements Upper (arms, wrists, hands, fingers): None, normal Lower (legs, knees, ankles, toes): None, normal, Trunk Movements Neck, shoulders, hips: None, normal, Overall Severity Severity of abnormal movements (highest score from questions above): None, normal Incapacitation due to abnormal movements: None, normal Patient's awareness of abnormal movements (rate only patient's report): No Awareness, Dental Status Current problems with teeth and/or dentures?: No Does patient usually wear dentures?: No  CIWA:  CIWA-Ar Total: 1 COWS:  COWS Total Score: 0  See Psychiatric Specialty Exam and Suicide Risk Assessment completed by Attending Physician prior to discharge.  Discharge destination:  Home  Is patient on multiple  antipsychotic therapies at discharge:  No   Has Patient had three or more failed trials of antipsychotic monotherapy by history:  No  Recommended Plan for Multiple Antipsychotic Therapies: NA  Allergies as of 10/18/2017   No Known Allergies     Medication List    STOP taking these medications   ibuprofen 200 MG tablet Commonly known as:  ADVIL,MOTRIN   KLONOPIN PO     TAKE these medications     Indication  albuterol 108 (90 Base) MCG/ACT inhaler Commonly known as:  PROVENTIL HFA;VENTOLIN HFA Inhale 1-2 puffs into the lungs every 6 (six) hours as needed for wheezing or shortness of breath.  Indication:  Asthma  ARIPiprazole 5 MG tablet Commonly known as:  ABILIFY Take 1 tablet (5 mg total) by mouth daily. Start taking on:  10/19/2017 What changed:    medication strength  how much to take  additional instructions  Indication:  Major Depressive Disorder, Adjunct to Cymbalta   DULoxetine 60 MG capsule Commonly known as:  CYMBALTA Take 1 capsule (60 mg total) by mouth daily. Start taking on:  10/19/2017 What changed:  additional instructions  Indication:  Major Depressive Disorder   hydrOXYzine 25 MG tablet Commonly known as:  ATARAX/VISTARIL Take 1 tablet (25 mg total) by mouth every 6 (six) hours as needed for anxiety. What changed:  Another medication with the same name was added. Make sure you understand how and when to take each.  Indication:  Feeling Anxious   hydrOXYzine 50 MG tablet Commonly known as:  ATARAX/VISTARIL Take 1 tablet (50 mg total) by mouth every 6 (six) hours as needed for anxiety. What changed:  You were already taking a medication with the same name, and this prescription was added. Make sure you understand how and when to take each.  Indication:  Feeling Anxious, Tension   loratadine 10 MG tablet Commonly known as:  CLARITIN Take 10 mg by mouth daily.  Indication:  Perennial Allergic Rhinitis, Hayfever   nicotine 21 mg/24hr  patch Commonly known as:  NICODERM CQ - dosed in mg/24 hours Place 1 patch (21 mg total) onto the skin daily. (May purchase from over the counter): For smoking cessation  Indication:  Nicotine Addiction   traZODone 100 MG tablet Commonly known as:  DESYREL Take 1 tablet (100 mg total) by mouth at bedtime as needed for sleep. What changed:    when to take this  reasons to take this  additional instructions  Indication:  Trouble Sleeping      Follow-up Information    [facility] Follow up.   Specialty:  Addiction Medicine Why:  Referral made on 8/12. You must attend court date before being eligible for residential treatment at this facility. Please call Shayla in admissions after court date if you are still interested in placement. Thank you.  Contact information: [address] [place] [phone]        [facility] Follow up on 10/22/2017.   Why:  Hospital follow-up on Tuesday, 8/20 at 8:00AM. Please bring the following if you have them: Photo ID, social security card, any proof of income if you have it, and hospital discharge paperwork. Thank you. Contact information: [address] [place] [address] [phone]          Follow-up recommendations: Activity:  As tolerated Diet: As recommended by your primary care doctor. Keep all scheduled follow-up appointments as recommended.  Comments: Patient is instructed prior to discharge to: Take all medications as prescribed by his/her mental healthcare provider. Report any adverse effects and or reactions from the medicines to his/her outpatient provider promptly. Patient has been instructed & cautioned: To not engage in alcohol and or illegal drug use while on prescription medicines. In the event of worsening symptoms, patient is instructed to call the crisis hotline, 911 and or go to the nearest ED for appropriate evaluation and treatment of symptoms. To  follow-up with his/her primary care provider for your other medical issues, concerns and or health care needs.   Signed: Truman Haywardakia S Starkes, FNP-BC 10/18/2017, 11:22 AM

## 2017-10-18 NOTE — [facility] Suicide Risk Assessment (Signed)
[facility] INPATIENT:  Family/Significant Other Suicide Prevention Education  Suicide Prevention Education:  Patient Refusal for Family/Significant Other Suicide Prevention Education: The patient Jeffery JaegerMark Kaltenbach has refused to provide written consent for family/significant other to be provided Family/Significant Other Suicide Prevention Education during admission and/or prior to discharge.  Physician notified.  SPE completed with pt, as pt refused to consent to family contact. SPI pamphlet provided to pt and pt was encouraged to share information with support network, ask questions, and talk about any concerns relating to SPE. Pt denies access to guns/firearms and verbalized understanding of information provided. Mobile Crisis information also provided to pt.   Rona RavensHeather S Temima Kutsch LCSW 10/18/2017, 9:26 AM

## 2017-10-18 NOTE — Progress Notes (Signed)
Patient ID: Jeffery JaegerMark Hancock, male   DOB: 02/25/1991, 27 y.o.   MRN: [id number] D: Pt observed in the dayroom interacting with peers. Pt at assessment endorsed severe anxiety with moderate depression; states, "I am always anxious and I don't think I can do much about it" Pt however, denies pain, SI, HI or AVH.  A: Medications offered as prescribed.  Support, encouragement, and safe environment provided.  15-minute safety checks continue. R: Pt was med compliant. Safety checks continue. Pt attended wrap-up group.

## 2017-10-18 NOTE — Progress Notes (Signed)
  [facility] Adult Case Management Discharge Plan :  Will you be returning to the same living situation after discharge:  Yes,  home with his mother At discharge, do you have transportation home?: Yes,  mother/family member Do you have the ability to pay for your medications: Yes,  mental health  Release of information consent forms completed and submitted to medical records by CSW.  Patient to Follow up at: Follow-up Information    [facility] Follow up.   Specialty:  Addiction Medicine Why:  Referral made on 8/12. You must attend court date before being eligible for residential treatment at this facility. Please call Shayla in admissions after court date if you are still interested in placement. Thank you.  Contact information: [address] [place] [address] [phone]        [facility] Follow up on 10/22/2017.   Why:  Hospital follow-up on Tuesday, 8/20 at 8:00AM. Please bring the following if you have them: Photo ID, social security card, any proof of income if you have it, and hospital discharge paperwork. Thank you. Contact information: [address] [place] [address] [phone]           Next level of care provider has access to [facility] Link:no  Safety Planning and Suicide Prevention discussed: Yes,  SPE completed with pt; pt declined to consent to collateral contact. SPI pamphlet and Mobile Crisis information also provided.   Have you used any form of tobacco in the last 30 days? (Cigarettes, Smokeless Tobacco, Cigars, and/or Pipes): Yes  Has patient been referred to the Quitline?: Patient refused referral  Patient has been referred for addiction treatment: Yes  Rona RavensHeather S Dakisha Schoof, LCSW 10/18/2017, 9:26 AM

## 2017-10-18 NOTE — Tx Team (Signed)
Interdisciplinary Treatment and Diagnostic Plan Update  10/18/2017 Time of Session: 0830AM Jeffery JaegerMark Hancock MRN: [id number]  Principal Diagnosis: MDD, recurrent, severe  Secondary Diagnoses: Principal Problem:   MDD (major depressive disorder), severe (HCC)   Current Medications:  Current Facility-Administered Medications  Medication Dose Route Frequency Provider Last Rate Last Dose  . acetaminophen (TYLENOL) tablet 650 mg  650 mg Oral Q6H PRN Kerry HoughSimon, [name], PA-C   650 mg at 10/18/17 0815  . albuterol (PROVENTIL HFA;VENTOLIN HFA) 108 (90 Base) MCG/ACT inhaler 1-2 puff  1-2 puff Inhalation Q6H PRN Donell SievertSimon, [name], PA-C   2 puff at 10/17/17 2019  . alum & mag hydroxide-simeth (MAALOX/MYLANTA) 200-200-20 MG/5ML suspension 30 mL  30 mL Oral Q4H PRN Donell SievertSimon, [name], PA-C      . ARIPiprazole (ABILIFY) tablet 5 mg  5 mg Oral Daily Cobos, Rockey SituFernando A, MD   5 mg at 10/18/17 [id number]  . DULoxetine (CYMBALTA) DR capsule 60 mg  60 mg Oral Daily Kerry HoughSimon, [name], PA-C   60 mg at 10/18/17 [id number]  . hydrOXYzine (ATARAX/VISTARIL) tablet 50 mg  50 mg Oral Q6H PRN Nira ConnBerry, Jason A, NP   50 mg at 10/18/17 0815  . loratadine (CLARITIN) tablet 10 mg  10 mg Oral Daily Kerry HoughSimon, [name], PA-C   10 mg at 10/18/17 [id number]  . magnesium hydroxide (MILK OF MAGNESIA) suspension 30 mL  30 mL Oral Daily PRN Kerry HoughSimon, [name], PA-C      . multivitamin with minerals tablet 1 tablet  1 tablet Oral Daily Cobos, Rockey SituFernando A, MD   1 tablet at 10/18/17 [phone]  . nicotine (NICODERM CQ - dosed in mg/24 hours) patch 21 mg  21 mg Transdermal Daily Donell SievertSimon, [name], PA-C   21 mg at 10/18/17 0820  . thiamine (VITAMIN B-1) tablet 100 mg  100 mg Oral Daily Cobos, Rockey SituFernando A, MD   100 mg at 10/18/17 0811  . traZODone (DESYREL) tablet 100 mg  100 mg Oral QHS PRN Cobos, Rockey SituFernando A, MD   100 mg at 10/17/17 2136   PTA Medications: Medications Prior to Admission  Medication Sig Dispense Refill Last Dose  . albuterol (PROVENTIL HFA;VENTOLIN HFA) 108  (90 Base) MCG/ACT inhaler Inhale 1-2 puffs into the lungs every 6 (six) hours as needed for wheezing or shortness of breath.   10/13/2017 at Unknown time  . ARIPiprazole (ABILIFY) 2 MG tablet Take 1 tablet (2 mg total) by mouth daily. For mood control/adjunct to antidepressant 30 tablet 0 10/13/2017 at Unknown time  . clonazePAM (KLONOPIN PO) Take 2 mg by mouth once.   10/13/2017 at Unknown time  . DULoxetine (CYMBALTA) 60 MG capsule Take 1 capsule (60 mg total) by mouth daily. For depression 30 capsule 0 10/13/2017 at Unknown time  . hydrOXYzine (ATARAX/VISTARIL) 25 MG tablet Take 1 tablet (25 mg total) by mouth every 6 (six) hours as needed for anxiety. 60 tablet 0 10/12/2017 at Unknown time  . ibuprofen (ADVIL,MOTRIN) 200 MG tablet Take 400 mg by mouth daily.   10/13/2017 at Unknown time  . loratadine (CLARITIN) 10 MG tablet Take 10 mg by mouth daily.   10/13/2017 at Unknown time  . nicotine (NICODERM CQ - DOSED IN MG/24 HOURS) 21 mg/24hr patch Place 1 patch (21 mg total) onto the skin daily. (May purchase from over the counter): For smoking cessation (Patient not taking: Reported on 10/13/2017) 28 patch 0 Not Taking at Unknown time  . traZODone (DESYREL) 100 MG tablet Take 1 tablet (100 mg total)  by mouth at bedtime. For sleep (Patient taking differently: Take 200 mg by mouth at bedtime. For sleep) 30 tablet 0 10/12/2017 at Unknown time    Patient Stressors: Legal issue Loss of housing Occupational concerns Substance abuse  Patient Strengths: Average or above average intelligence Communication skills General fund of knowledge Motivation for treatment/growth Physical Health Religious Affiliation Supportive family/friends  Treatment Modalities: Medication Management, Group therapy, Case management,  1 to 1 session with clinician, Psychoeducation, Recreational therapy.   Physician Treatment Plan for Primary Diagnosis: MDD, recurrent, severe  Medication Management: Evaluate patient's response,  side effects, and tolerance of medication regimen.  Therapeutic Interventions: 1 to 1 sessions, Unit Group sessions and Medication administration.  Evaluation of Outcomes: Adequate for discharge   Physician Treatment Plan for Secondary Diagnosis: Principal Problem:   MDD (major depressive disorder), severe (HCC)  Medication Management: Evaluate patient's response, side effects, and tolerance of medication regimen.  Therapeutic Interventions: 1 to 1 sessions, Unit Group sessions and Medication administration.  Evaluation of Outcomes: Adequate for discharge   RN Treatment Plan for Primary Diagnosis: MDD, recurrent, severe Long Term Goal(s): Knowledge of disease and therapeutic regimen to maintain health will improve  Short Term Goals: Ability to remain free from injury will improve, Ability to verbalize frustration and anger appropriately will improve, Ability to disclose and discuss suicidal ideas and Ability to identify and develop effective coping behaviors will improve  Medication Management: RN will administer medications as ordered by provider, will assess and evaluate patient's response and provide education to patient for prescribed medication. RN will report any adverse and/or side effects to prescribing provider.  Therapeutic Interventions: 1 on 1 counseling sessions, Psychoeducation, Medication administration, Evaluate responses to treatment, Monitor vital signs and CBGs as ordered, Perform/monitor CIWA, COWS, AIMS and Fall Risk screenings as ordered, Perform wound care treatments as ordered.  Evaluation of Outcomes: Adequate for discharge   LCSW Treatment Plan for Primary Diagnosis: MDD, recurrent, severe Long Term Goal(s): Safe transition to appropriate next level of care at discharge, Engage patient in therapeutic group addressing interpersonal concerns.  Short Term Goals: Engage patient in aftercare planning with referrals and resources, Facilitate patient progression  through stages of change regarding substance use diagnoses and concerns and Identify triggers associated with mental health/substance abuse issues  Therapeutic Interventions: Assess for all discharge needs, 1 to 1 time with Social worker, Explore available resources and support systems, Assess for adequacy in community support network, Educate family and significant other(s) on suicide prevention, Complete Psychosocial Assessment, Interpersonal group therapy.  Evaluation of Outcomes: Adequate for discharge   Progress in Treatment: Attending groups: Yes.  Participating in groups: Yes Taking medication as prescribed: Yes. Toleration medication: Yes. Family/Significant other contact made: SPE completed with pt; pt declined to consent to collateral contact.  Patient understands diagnosis: Yes. Discussing patient identified problems/goals with staff: Yes. Medical problems stabilized or resolved: Yes. Denies suicidal/homicidal ideation: Yes. Issues/concerns per patient self-inventory: No. Other: n/a   New problem(s) identified: No, Describe:  n/a  New Short Term/Long Term Goal(s): detox, medication management for mood stabilization; elimination of SI thoughts; development of comprehensive mental wellness/sobriety plan.   Patient Goals:  "to get my meds right and get away from drugs again."   Discharge Plan or Barriers: CSW assessing for appropriate referrals. MHAG pamphlet, Mobile Crisis information, and AA/NA information provided to patient for additional community support and resources.   Reason for Continuation of Hospitalization: none  Estimated Length of Stay: Friday, 8/16  Attendees: Patient: 10/18/2017 9:21 AM  Physician: Dr. Jola Babinski MD; Dr. Altamese Col[place] MD 10/18/2017 9:21 AM  Nursing: Cicero Duck RN; [place] RN 10/18/2017 9:21 AM  RN Care Manager:x 10/18/2017 9:21 AM  Social Worker: Corrie Mckusick LCSW 10/18/2017 9:21 AM  Recreational Therapist: x 10/18/2017 9:21 AM  Other: Armandina Stammer NP;  Gilda Crease NP 10/18/2017 9:21 AM  Other:  10/18/2017 9:21 AM  Other: 10/18/2017 9:21 AM    Scribe for Treatment Team: Rona Ravens, LCSW 10/18/2017 9:21 AM

## 2017-10-18 NOTE — [facility] Suicide Risk Assessment (Signed)
[facility]   Principal Problem: MDD (major depressive disorder), severe [facility](HCC) Discharge Diagnoses:  Patient Active Problem List   Diagnosis Date Noted  . MDD (major depressive disorder), severe (HCC) [F32.2] 03/14/2017  . MDD (major depressive disorder), recurrent severe, without psychosis (HCC) [F33.2] 08/02/2014  . Alcohol use disorder, severe, dependence (HCC) [F10.20] 08/02/2014  . Alcohol withdrawal (HCC) [F10.239] 08/02/2014  . 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 Cocaine use disorder, moderate, dependence (HCC) [F14.20] 08/02/2014  . Substance induced mood disorder (HCC) [F19.94] 07/30/2014    Total Time spent with patient: 15 minutes  Musculoskeletal: Strength & Muscle Tone: within normal limits Gait & Station: normal Patient leans: N/A  Psychiatric Specialty Exam: Review of Systems  All other systems reviewed and are negative.   Blood pressure 135/85, pulse (!) 128, temperature 98.5 F (36.9 C), temperature source Oral, resp. rate 20, height 6' (1.829 m), weight 131.5 kg.Body mass index is 39.33 kg/m.  General Appearance: Casual  Eye Contact::  Good  Speech:  Normal Rate409  Volume:  Normal  Mood:  Euthymic  Affect:  Congruent  Thought Process:  Coherent  Orientation:  Full (Time, Place, and Person)  Thought Content:  Logical  Suicidal Thoughts:  No  Homicidal Thoughts:  No  Memory:  Immediate;   Fair Recent;   Fair Remote;   Fair  Judgement:  Intact  Insight:  Fair  Psychomotor Activity:  Normal  Concentration:  Fair  Recall:  F[employer]Fair  Fund of Knowledge:Fair  Language: Good  Akathisia:  Negative  Handed:  Right  AIMS (if indicated):     Assets:  Communication Skills Desire for Improvement Housing Physical Health Resilience Social Support  Sleep:  Number of Hours: 6.75  Cognition: WNL  ADL's:  Intact   Mental Status Per  Nursing Assessment::   On Admission:  Suicidal ideation indicated by patient, Suicide plan, Plan includes specific time, place, or method, Self-harm thoughts, Intention to act on suicide plan  Demographic Factors:  Male, Caucasian and Low socioeconomic status  Loss Factors: NA  Historical Factors: Impulsivity  Risk Reduction Factors:   Living with another person, especially a relative  Continued Clinical Symptoms:  Depression:   Impulsivity Alcohol/Substance Abuse/Dependencies  Cognitive Features That Contribute To Risk:  None    Suicide Risk:  Minimal: No identifiable suicidal ideation.  Patients presenting with no risk factors but with morbid ruminations; may be classified as minimal risk based on the severity of the depressive symptoms  Follow-up Information    [facility] Follow up.   Specialty:  Addiction Medicine Why:  Referral made on 8/12. You must attend court date before being eligible for residential treatment at this facility. Please call Shayla in admissions after court date if you are still interested in placement. Thank you.  Contact information: [address] [place] [address] [phone]        [facility] Follow up on 10/22/2017.   Why:  Hospital follow-up on Tuesday, 8/20 at 8:00AM. Please bring the following if you have them: Photo ID, social security card, any proof of income if you have it, and hospital discharge paperwork. Thank you. Contact information: [address] [place] [address] [phone]           Plan Of Care/Follow-up recommendations:  Activity:  ad lib  Antonieta PertGreg Lawson Lourdes Manning, MD 10/18/2017, 7:53 AM

## 2017-10-18 NOTE — Plan of Care (Signed)
  Problem: Education: Goal: Knowledge of [facility] Education information/materials will improve Outcome: Completed/Met Goal: Emotional status will improve Outcome: Completed/Met Goal: Mental status will improve Outcome: Completed/Met Goal: Verbalization of understanding the information provided will improve Outcome: Completed/Met   Problem: Activity: Goal: Interest or engagement in activities will improve Outcome: Completed/Met Goal: Sleeping patterns will improve Outcome: Completed/Met   Problem: Coping: Goal: Ability to verbalize frustrations and anger appropriately will improve Outcome: Completed/Met Goal: Ability to demonstrate self-control will improve Outcome: Completed/Met   Problem: Health Behavior/Discharge Planning: Goal: Identification of resources available to assist in meeting health care needs will improve Outcome: Completed/Met Goal: Compliance with treatment plan for underlying cause of condition will improve Outcome: Completed/Met   Problem: Physical Regulation: Goal: Ability to maintain clinical measurements within normal limits will improve Outcome: Completed/Met   Problem: Safety: Goal: Periods of time without injury will increase Outcome: Completed/Met   Problem: Education: Goal: Knowledge of disease or condition will improve Outcome: Completed/Met   Problem: Health Behavior/Discharge Planning: Goal: Ability to identify changes in lifestyle to reduce recurrence of condition will improve Outcome: Completed/Met   Problem: Coping: Goal: Coping ability will improve Outcome: Completed/Met   Problem: Medication: Goal: Compliance with prescribed medication regimen will improve Outcome: Completed/Met   Problem: Education: Goal: Utilization of techniques to improve thought processes will improve Outcome: Completed/Met   Problem: Safety: Goal: Ability to identify and utilize support systems that promote safety will improve Outcome:  Completed/Met

## 2019-03-06 DEATH — deceased

## 2019-05-10 IMAGING — CT CT HEAD W/O CM
3 series · 15 of 47 positions shown, 18 images · non-contrast
Comparison: None.

CLINICAL DATA: Blurry vision after taking heroin 15 minutes ago.
Difficulty breathing.

EXAM:
CT HEAD WITHOUT CONTRAST
TECHNIQUE: Contiguous axial images were obtained from the base of the skull
through the vertex without intravenous contrast.

[Series 2: head wo · axial · 0.44mm/px · z∈[+29,+159]mm · 9 of 32 slices shown, 12 images]
[im 3/32  brain]
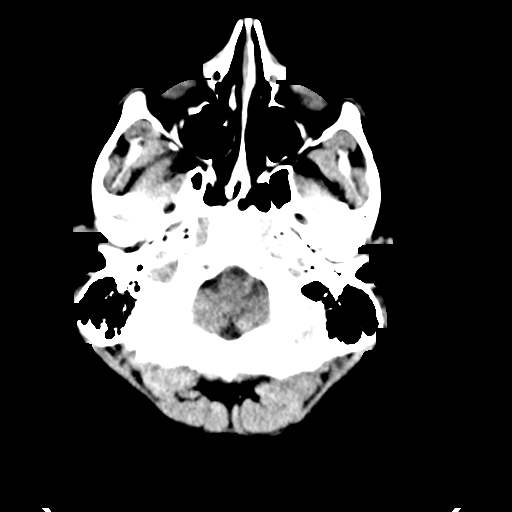
[im 3/32  bone]
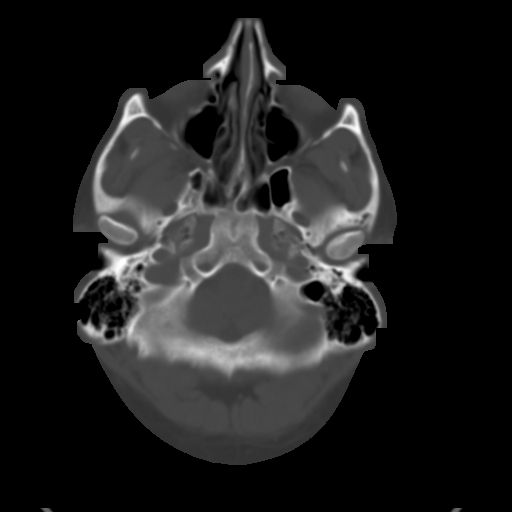
[im 6/32  brain]
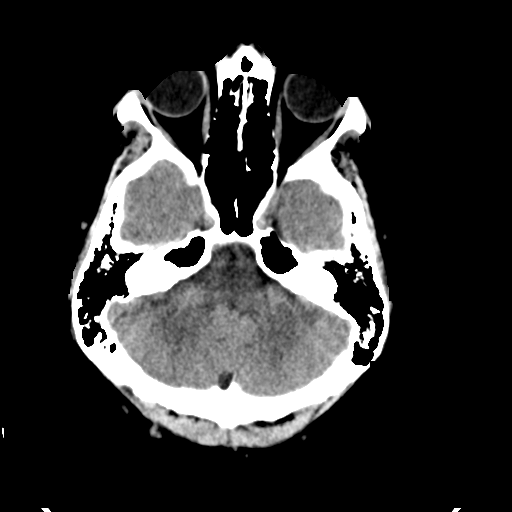
[im 9/32  brain]
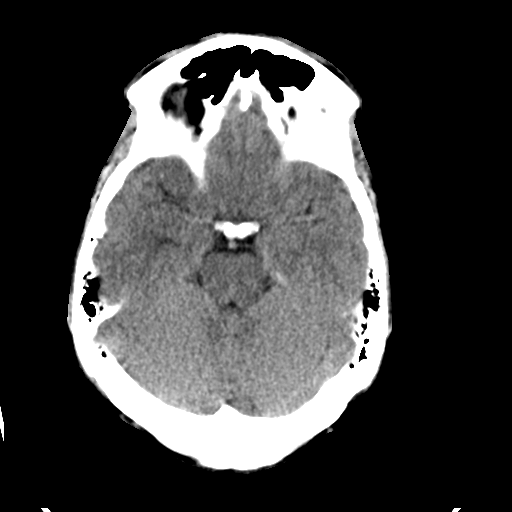
[im 12/32  brain]
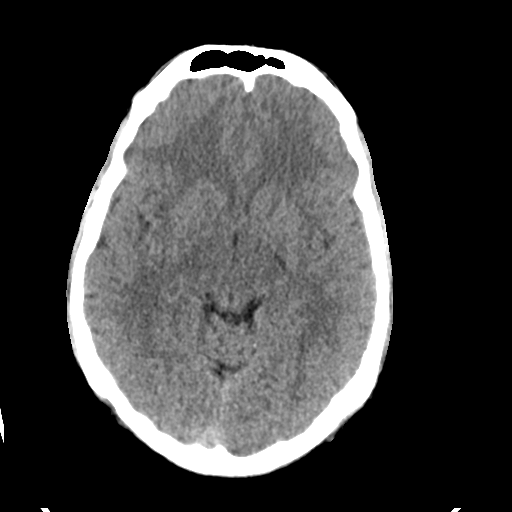
[im 17/32  brain]
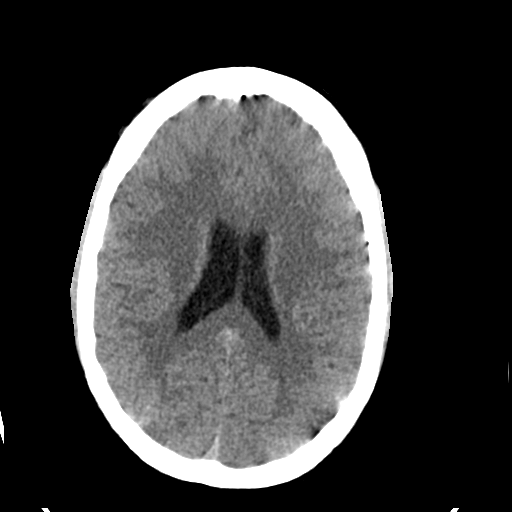
[im 17/32  bone]
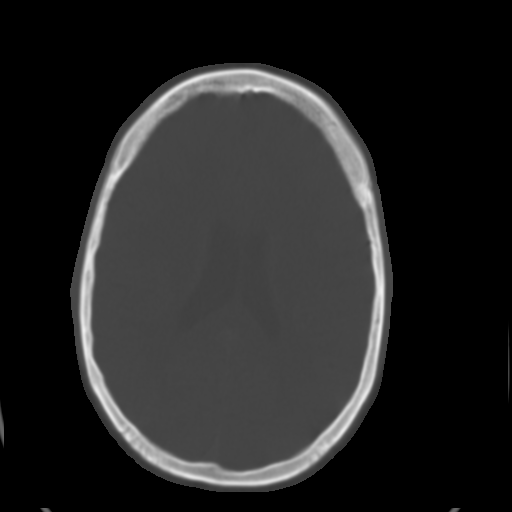
[im 20/32  brain]
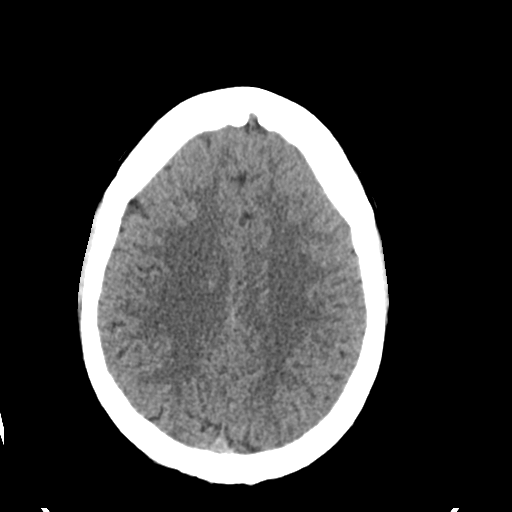
[im 23/32  brain]
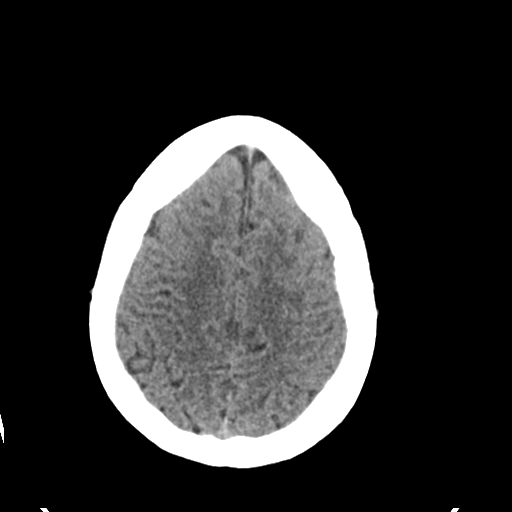
[im 26/32  brain]
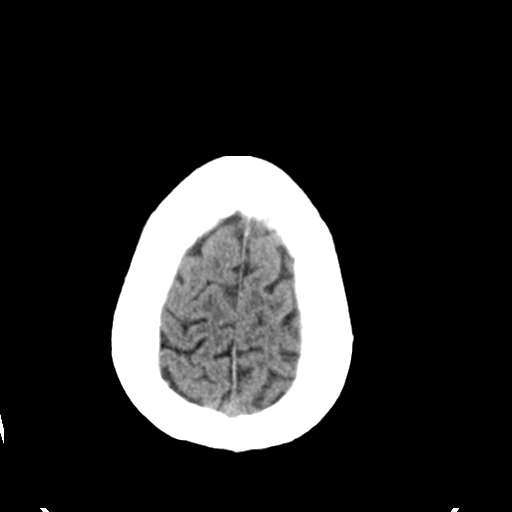
[im 29/32  brain]
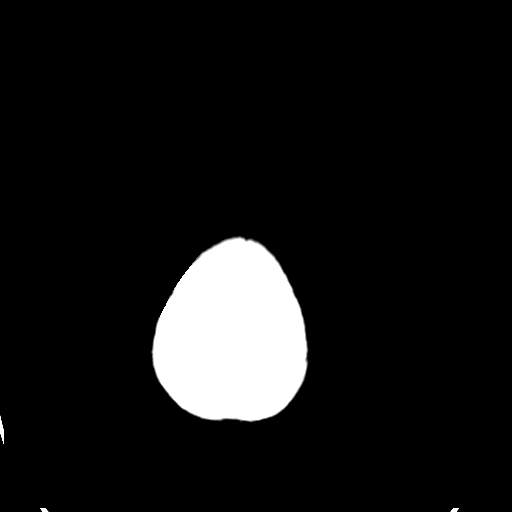
[im 29/32  bone]
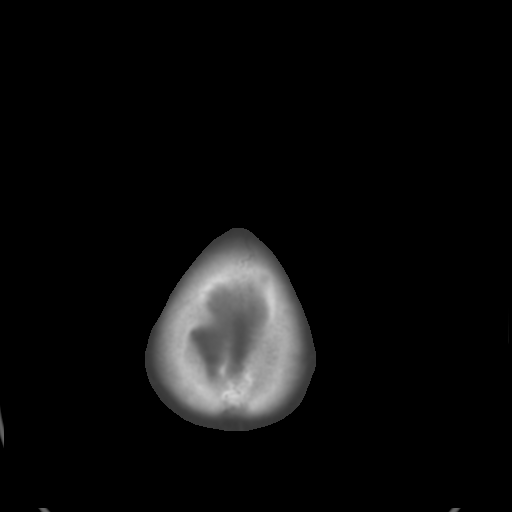

[Series 4: coronal soft tissue · coronal · 0.31mm/px · 3 of 69 slices shown]
[im 23/69  brain]
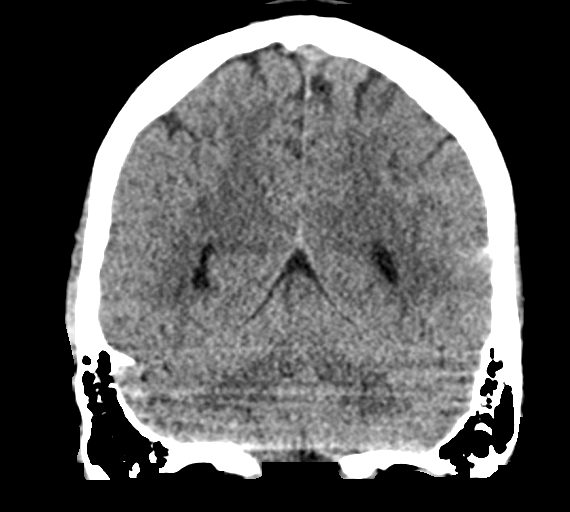
[im 31/69  brain]
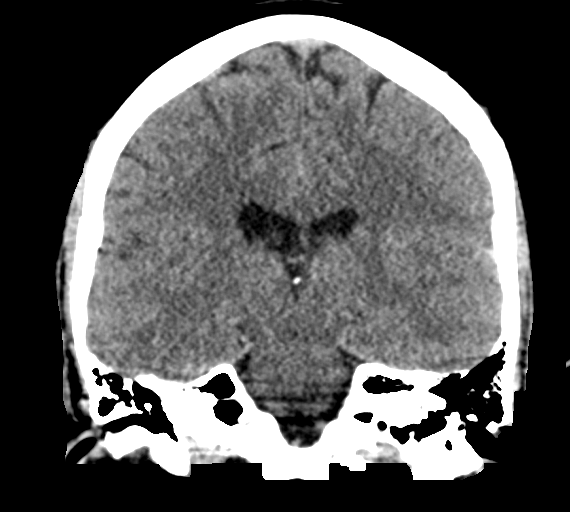
[im 38/69  brain]
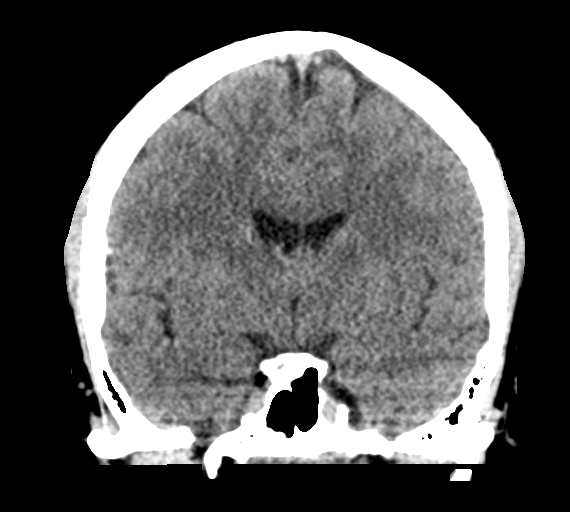

[Series 5: sagittal soft tissue · sagittal · 0.31mm/px · 3 of 57 slices shown]
[im 19/57  brain]
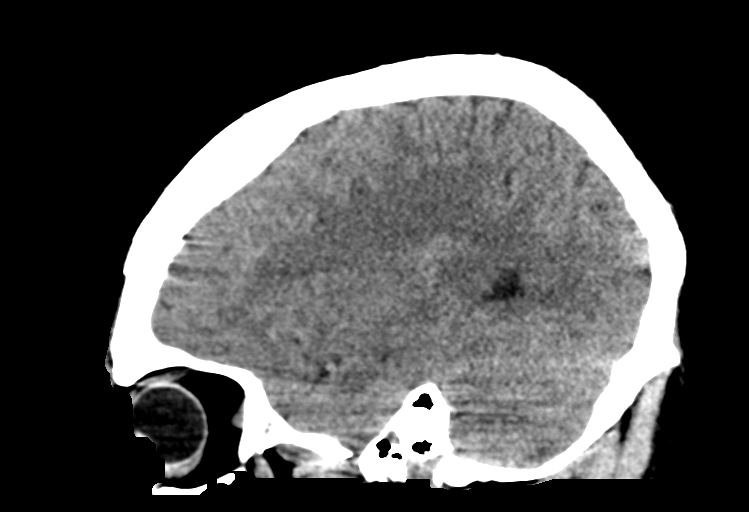
[im 29/57  brain]
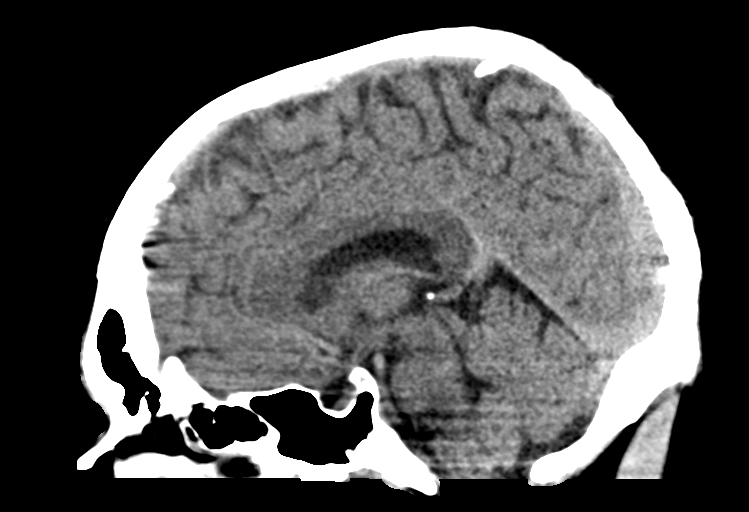
[im 38/57  brain]
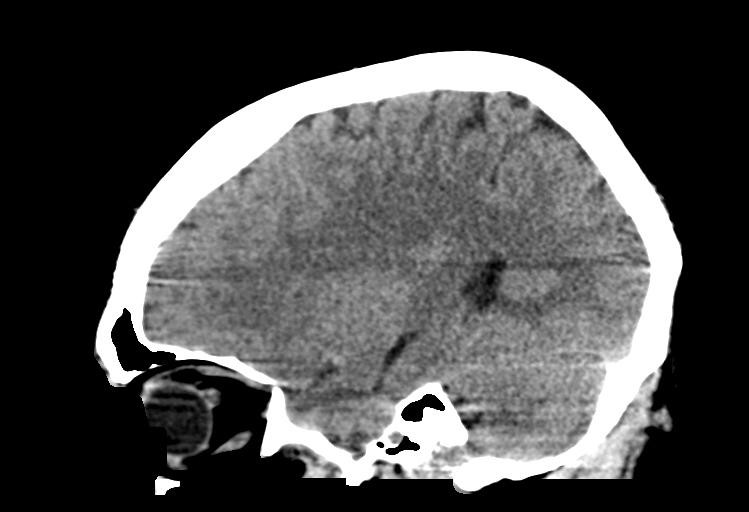

[15 of 47 positions shown; findings below may reference images not displayed]

FINDINGS: Brain: No evidence of acute infarction, hemorrhage, hydrocephalus,
extra-axial collection or mass lesion/mass effect.

Vascular: No hyperdense vessel or unexpected calcification.

Skull: Normal. Negative for fracture or focal lesion.

Sinuses/Orbits: No acute finding.

Other: None.
IMPRESSION: No acute intracranial abnormality

## 2019-05-10 IMAGING — CT CT ANGIO CHEST
2 of 6 series · 18 of 46 positions shown · IV contrast (Isovue)
Comparison: CXR 01/08/2016

CLINICAL DATA: Dyspnea after heroin use 15 minutes ago.

EXAM:
CT ANGIOGRAPHY CHEST WITH CONTRAST
TECHNIQUE: Multidetector CT imaging of the chest was performed using the
standard protocol during bolus administration of intravenous
contrast. Multiplanar CT image reconstructions and MIPs were
obtained to evaluate the vascular anatomy.
CONTRAST:  100 cc Isovue 370 IV

[Series 6: thins · axial · 0.81mm/px · z∈[-428,-134]mm · 15 of 323 slices shown]
[im 15/323  lung]
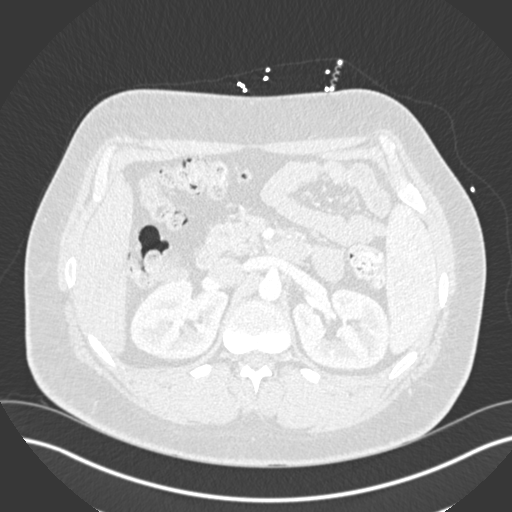
[im 43/323  soft-tissue]
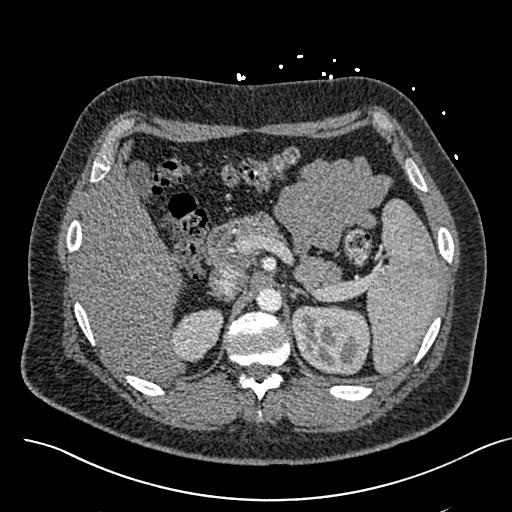
[im 57/323  lung]
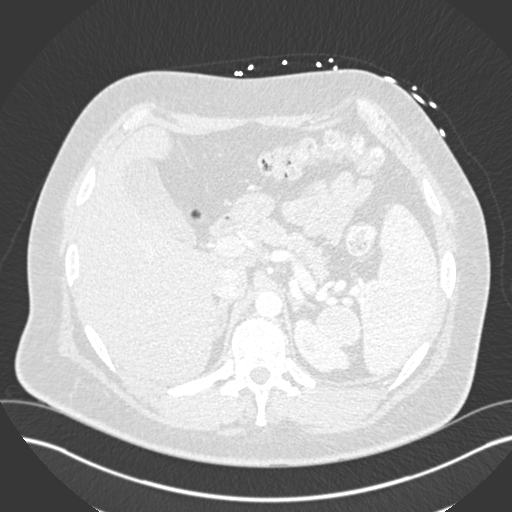
[im 85/323  soft-tissue]
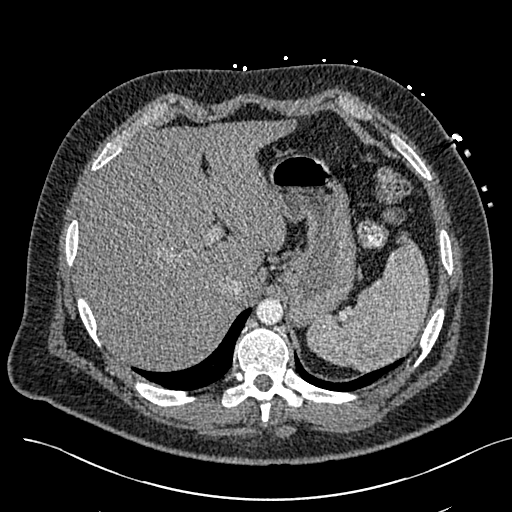
[im 99/323  lung]
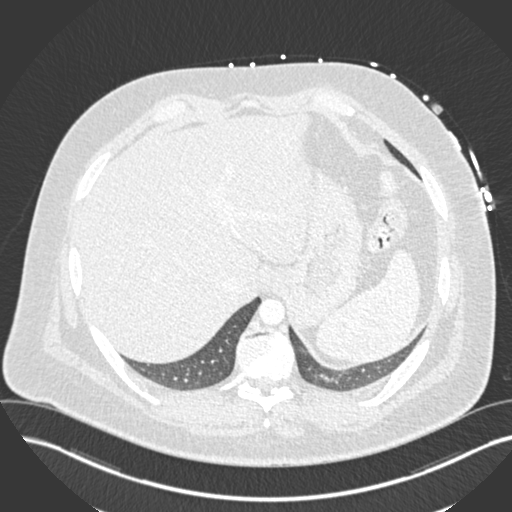
[im 127/323  soft-tissue]
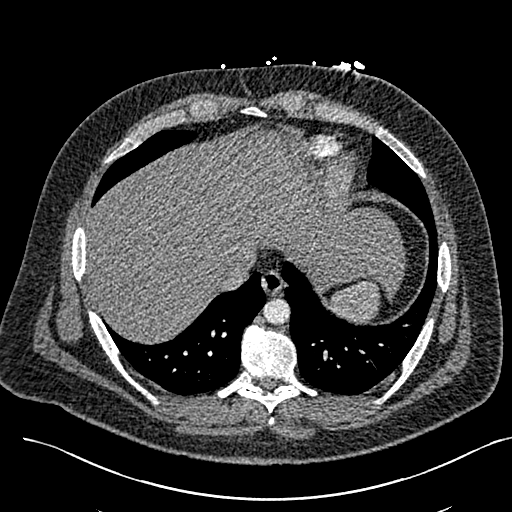
[im 141/323  lung]
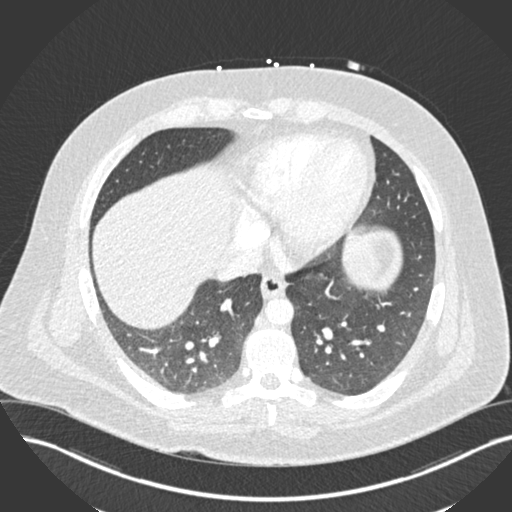
[im 169/323  soft-tissue]
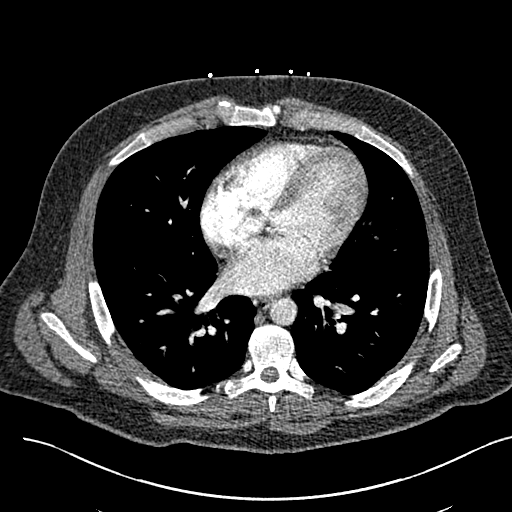
[im 183/323  lung]
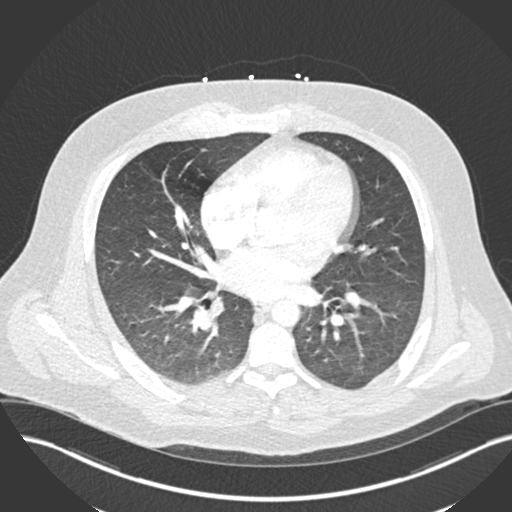
[im 197/323  soft-tissue]
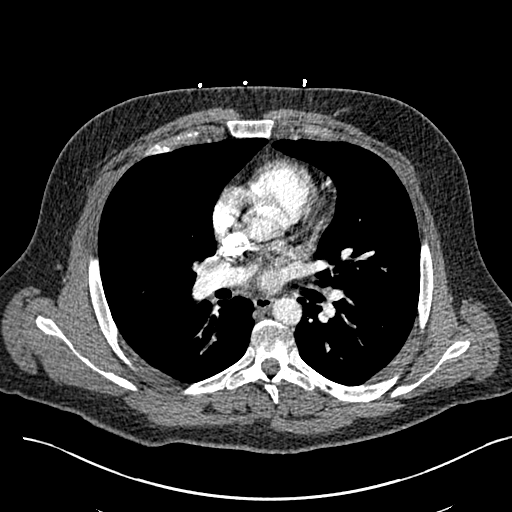
[im 225/323  lung]
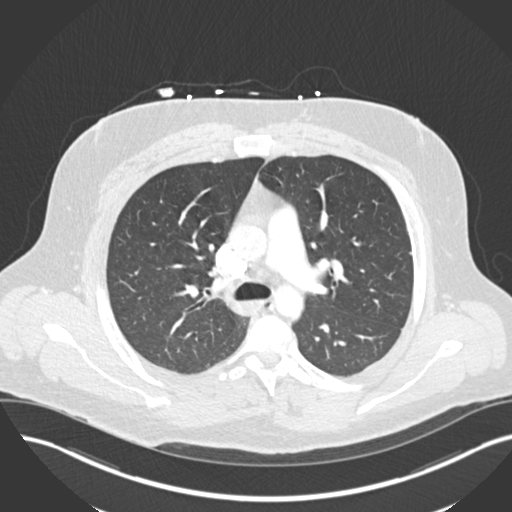
[im 239/323  soft-tissue]
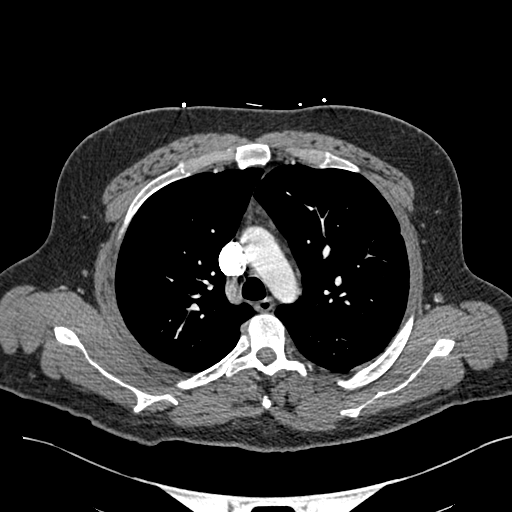
[im 267/323  lung]
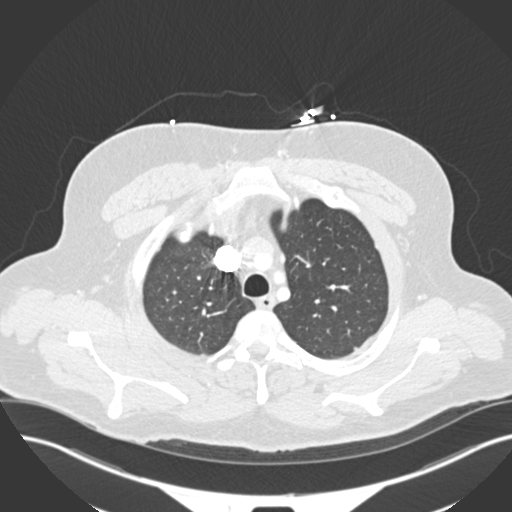
[im 281/323  soft-tissue]
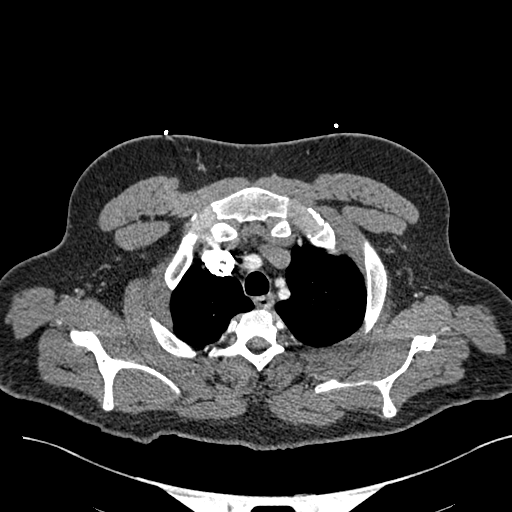
[im 309/323  lung]
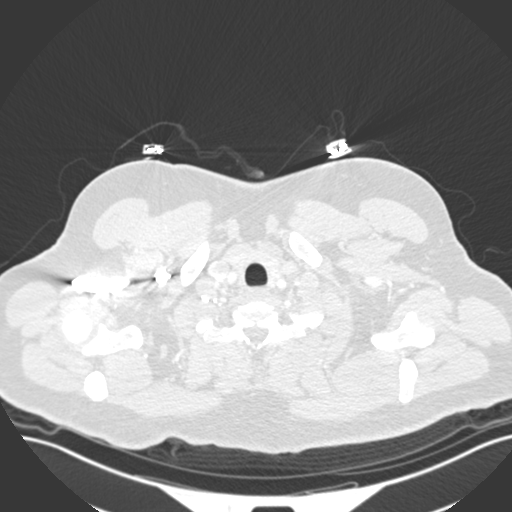

[Series 8: coronal mpr · coronal · 0.62mm/px · 3 of 154 slices shown]
[im 39/154  soft-tissue]
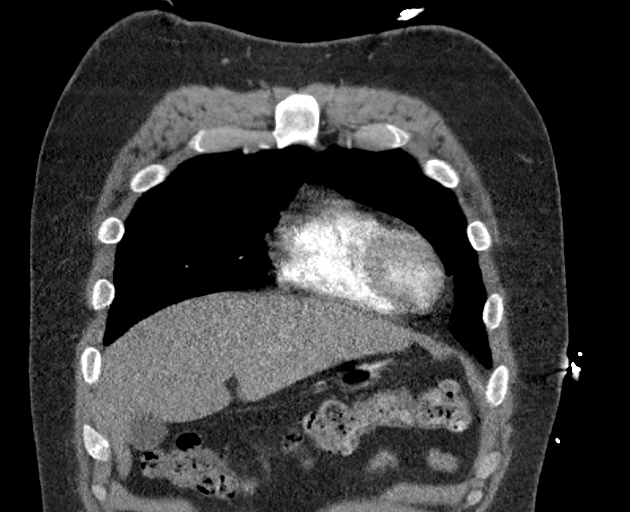
[im 77/154  soft-tissue]
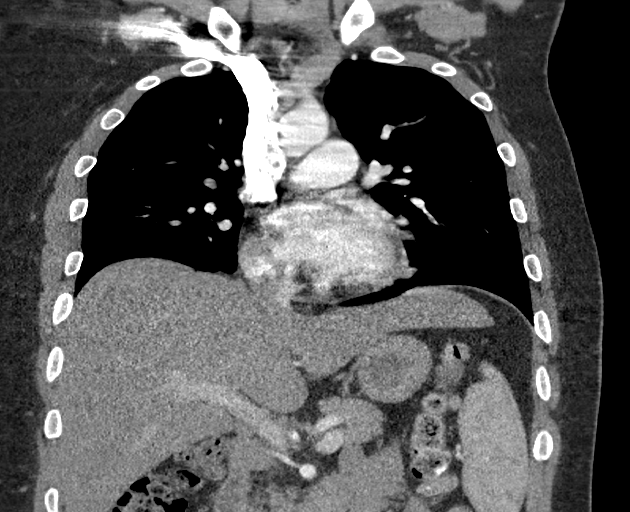
[im 115/154  soft-tissue]
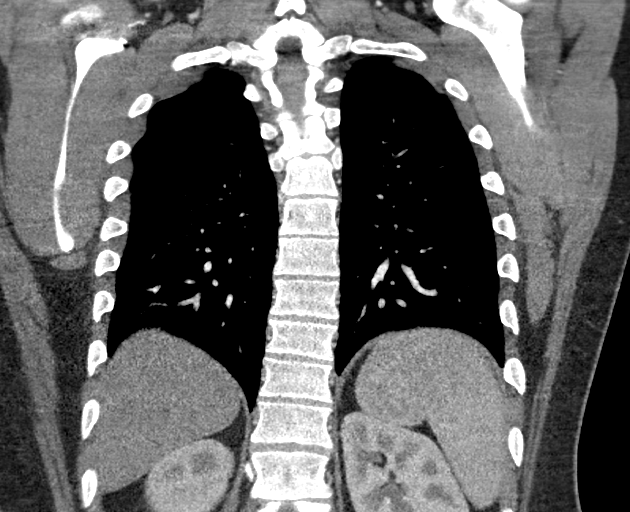

[18 of 46 positions shown; findings below may reference images not displayed]

FINDINGS: Cardiovascular: The study is of quality for the evaluation of
pulmonary embolism. There are no filling defects in the central,
lobar, segmental or subsegmental pulmonary artery branches to
suggest acute pulmonary embolism. Great vessels are normal in course
and caliber. Normal heart size. No significant pericardial
fluid/thickening.

Mediastinum/Nodes: No discrete thyroid nodules. Unremarkable
esophagus. No pathologically enlarged axillary, mediastinal or hilar
lymph nodes.

Lungs/Pleura: No pneumothorax. No pleural effusion. No pulmonary
edema or pneumonic consolidation.

Upper abdomen: Unremarkable.

Musculoskeletal:  No aggressive appearing focal osseous lesions.

Review of the MIP images confirms the above findings.
IMPRESSION: 1. No active cardiopulmonary disease.
2. No pulmonary embolus or pneumonic consolidation.
3. No pulmonary edema.
